# Patient Record
Sex: Female | Born: 1955 | Race: White | Hispanic: No | Marital: Married | Smoking: Never smoker
Health system: Southern US, Community
[De-identification: ages and names within clinical notes are randomized; demographics above are authoritative.]

## PROBLEM LIST (undated history)

## (undated) DIAGNOSIS — N189 Chronic kidney disease, unspecified: Secondary | ICD-10-CM

## (undated) DIAGNOSIS — I10 Essential (primary) hypertension: Secondary | ICD-10-CM

## (undated) DIAGNOSIS — D649 Anemia, unspecified: Secondary | ICD-10-CM

## (undated) DIAGNOSIS — E785 Hyperlipidemia, unspecified: Secondary | ICD-10-CM

## (undated) DIAGNOSIS — E119 Type 2 diabetes mellitus without complications: Secondary | ICD-10-CM

## (undated) DIAGNOSIS — E039 Hypothyroidism, unspecified: Secondary | ICD-10-CM

## (undated) HISTORY — DX: Essential (primary) hypertension: I10

## (undated) HISTORY — PX: CHOLECYSTECTOMY: SHX55

## (undated) HISTORY — DX: Type 2 diabetes mellitus without complications: E11.9

## (undated) HISTORY — DX: Chronic kidney disease, unspecified: N18.9

## (undated) HISTORY — DX: Hyperlipidemia, unspecified: E78.5

## (undated) HISTORY — DX: Hypothyroidism, unspecified: E03.9

## (undated) HISTORY — PX: ABDOMINAL HYSTERECTOMY: SHX81

## (undated) SURGERY — ARTERIOVENOUS (AV) FISTULA CREATION
Anesthesia: Monitor Anesthesia Care | Laterality: Left

---

## 1998-11-21 ENCOUNTER — Other Ambulatory Visit: Admission: RE | Admit: 1998-11-21 | Discharge: 1998-11-21 | Payer: Self-pay | Admitting: Internal Medicine

## 1999-07-13 ENCOUNTER — Encounter: Admission: RE | Admit: 1999-07-13 | Discharge: 1999-07-13 | Payer: Self-pay | Admitting: Internal Medicine

## 1999-07-13 ENCOUNTER — Encounter: Payer: Self-pay | Admitting: Internal Medicine

## 2000-12-23 ENCOUNTER — Encounter: Payer: Self-pay | Admitting: Internal Medicine

## 2000-12-23 ENCOUNTER — Encounter: Admission: RE | Admit: 2000-12-23 | Discharge: 2000-12-23 | Payer: Self-pay | Admitting: Internal Medicine

## 2002-12-22 ENCOUNTER — Encounter: Payer: Self-pay | Admitting: Internal Medicine

## 2002-12-22 ENCOUNTER — Encounter: Admission: RE | Admit: 2002-12-22 | Discharge: 2002-12-22 | Payer: Self-pay | Admitting: Internal Medicine

## 2004-03-02 ENCOUNTER — Other Ambulatory Visit: Admission: RE | Admit: 2004-03-02 | Discharge: 2004-03-02 | Payer: Self-pay | Admitting: Obstetrics and Gynecology

## 2004-11-16 ENCOUNTER — Encounter: Admission: RE | Admit: 2004-11-16 | Discharge: 2004-11-16 | Payer: Self-pay | Admitting: Obstetrics and Gynecology

## 2010-05-13 ENCOUNTER — Encounter: Payer: Self-pay | Admitting: Family Medicine

## 2013-04-03 ENCOUNTER — Other Ambulatory Visit: Payer: Self-pay | Admitting: Interventional Cardiology

## 2013-05-13 ENCOUNTER — Other Ambulatory Visit: Payer: Self-pay | Admitting: Interventional Cardiology

## 2013-06-22 ENCOUNTER — Ambulatory Visit: Payer: Self-pay | Admitting: Endocrinology

## 2013-07-20 ENCOUNTER — Ambulatory Visit: Payer: Self-pay | Admitting: Endocrinology

## 2013-07-20 DIAGNOSIS — Z0289 Encounter for other administrative examinations: Secondary | ICD-10-CM

## 2013-09-16 ENCOUNTER — Ambulatory Visit: Payer: Self-pay | Admitting: Endocrinology

## 2013-09-27 ENCOUNTER — Ambulatory Visit: Payer: Self-pay | Admitting: Endocrinology

## 2013-12-24 ENCOUNTER — Other Ambulatory Visit: Payer: Self-pay | Admitting: Family Medicine

## 2013-12-24 DIAGNOSIS — Z1231 Encounter for screening mammogram for malignant neoplasm of breast: Secondary | ICD-10-CM

## 2014-01-10 ENCOUNTER — Encounter (INDEPENDENT_AMBULATORY_CARE_PROVIDER_SITE_OTHER): Payer: Self-pay

## 2014-01-10 ENCOUNTER — Ambulatory Visit
Admission: RE | Admit: 2014-01-10 | Discharge: 2014-01-10 | Disposition: A | Discharge status: Home / Self Care | Payer: BC Managed Care – PPO | Source: Ambulatory Visit | Attending: Family Medicine | Admitting: Family Medicine

## 2014-01-10 DIAGNOSIS — Z1231 Encounter for screening mammogram for malignant neoplasm of breast: Secondary | ICD-10-CM

## 2014-03-14 ENCOUNTER — Ambulatory Visit: Payer: Self-pay | Admitting: Interventional Cardiology

## 2014-03-14 DIAGNOSIS — M949 Disorder of cartilage, unspecified: Secondary | ICD-10-CM

## 2014-03-14 DIAGNOSIS — I10 Essential (primary) hypertension: Secondary | ICD-10-CM | POA: Insufficient documentation

## 2014-03-14 DIAGNOSIS — M899 Disorder of bone, unspecified: Secondary | ICD-10-CM | POA: Insufficient documentation

## 2014-03-14 DIAGNOSIS — E039 Hypothyroidism, unspecified: Secondary | ICD-10-CM | POA: Insufficient documentation

## 2014-03-14 DIAGNOSIS — E669 Obesity, unspecified: Secondary | ICD-10-CM | POA: Insufficient documentation

## 2014-03-14 DIAGNOSIS — E7800 Pure hypercholesterolemia, unspecified: Secondary | ICD-10-CM | POA: Insufficient documentation

## 2014-03-14 DIAGNOSIS — I421 Obstructive hypertrophic cardiomyopathy: Secondary | ICD-10-CM | POA: Insufficient documentation

## 2014-03-14 DIAGNOSIS — E1165 Type 2 diabetes mellitus with hyperglycemia: Secondary | ICD-10-CM | POA: Insufficient documentation

## 2014-03-14 DIAGNOSIS — IMO0002 Reserved for concepts with insufficient information to code with codable children: Secondary | ICD-10-CM | POA: Insufficient documentation

## 2014-05-16 ENCOUNTER — Ambulatory Visit: Payer: Self-pay | Admitting: Interventional Cardiology

## 2014-06-01 ENCOUNTER — Encounter: Payer: Self-pay | Admitting: Interventional Cardiology

## 2016-07-25 ENCOUNTER — Other Ambulatory Visit: Payer: Self-pay | Admitting: Family Medicine

## 2016-07-25 DIAGNOSIS — Z1231 Encounter for screening mammogram for malignant neoplasm of breast: Secondary | ICD-10-CM

## 2016-08-19 ENCOUNTER — Ambulatory Visit
Admission: RE | Admit: 2016-08-19 | Discharge: 2016-08-19 | Disposition: A | Discharge status: Home / Self Care | Payer: No Typology Code available for payment source | Source: Ambulatory Visit | Attending: Family Medicine | Admitting: Family Medicine

## 2016-08-19 DIAGNOSIS — Z1231 Encounter for screening mammogram for malignant neoplasm of breast: Secondary | ICD-10-CM

## 2017-06-10 ENCOUNTER — Other Ambulatory Visit: Payer: Self-pay

## 2017-06-10 DIAGNOSIS — E7800 Pure hypercholesterolemia, unspecified: Secondary | ICD-10-CM

## 2017-06-10 DIAGNOSIS — E039 Hypothyroidism, unspecified: Secondary | ICD-10-CM

## 2017-06-10 DIAGNOSIS — Z1321 Encounter for screening for nutritional disorder: Secondary | ICD-10-CM

## 2017-06-10 DIAGNOSIS — I10 Essential (primary) hypertension: Secondary | ICD-10-CM

## 2017-06-10 DIAGNOSIS — E119 Type 2 diabetes mellitus without complications: Secondary | ICD-10-CM

## 2017-06-10 DIAGNOSIS — Z0000 Encounter for general adult medical examination without abnormal findings: Secondary | ICD-10-CM

## 2017-06-11 ENCOUNTER — Other Ambulatory Visit: Payer: Self-pay

## 2017-06-11 MED ORDER — CLONIDINE HCL 0.2 MG PO TABS: 0.2000 mg | ORAL_TABLET | Freq: Two times a day (BID) | ORAL | 0 refills | Status: DC

## 2017-06-11 MED ORDER — LISINOPRIL 10 MG PO TABS: 10.0000 mg | ORAL_TABLET | Freq: Every day | ORAL | 0 refills | Status: DC

## 2017-06-11 NOTE — Telephone Encounter (Signed)
Patient called states she's out of her BP meds and her previous pcp won't refill them. She is a new patient her appointment is on 07/03/17.

## 2017-06-11 NOTE — Telephone Encounter (Signed)
Please refill these for 90 days

## 2017-06-24 ENCOUNTER — Other Ambulatory Visit: Payer: Self-pay | Admitting: Family Medicine

## 2017-06-24 DIAGNOSIS — M899 Disorder of bone, unspecified: Secondary | ICD-10-CM

## 2017-06-24 DIAGNOSIS — Z0000 Encounter for general adult medical examination without abnormal findings: Secondary | ICD-10-CM

## 2017-06-24 DIAGNOSIS — I10 Essential (primary) hypertension: Secondary | ICD-10-CM

## 2017-06-24 DIAGNOSIS — M949 Disorder of cartilage, unspecified: Secondary | ICD-10-CM

## 2017-06-24 DIAGNOSIS — E119 Type 2 diabetes mellitus without complications: Secondary | ICD-10-CM

## 2017-06-24 DIAGNOSIS — E039 Hypothyroidism, unspecified: Secondary | ICD-10-CM

## 2017-06-24 DIAGNOSIS — E7800 Pure hypercholesterolemia, unspecified: Secondary | ICD-10-CM

## 2017-06-26 ENCOUNTER — Other Ambulatory Visit: Payer: No Typology Code available for payment source | Admitting: Internal Medicine

## 2017-06-26 DIAGNOSIS — I10 Essential (primary) hypertension: Secondary | ICD-10-CM

## 2017-06-26 DIAGNOSIS — Z0000 Encounter for general adult medical examination without abnormal findings: Secondary | ICD-10-CM

## 2017-06-26 DIAGNOSIS — Z1321 Encounter for screening for nutritional disorder: Secondary | ICD-10-CM

## 2017-06-26 DIAGNOSIS — E039 Hypothyroidism, unspecified: Secondary | ICD-10-CM

## 2017-06-26 DIAGNOSIS — E119 Type 2 diabetes mellitus without complications: Secondary | ICD-10-CM

## 2017-06-26 DIAGNOSIS — E7800 Pure hypercholesterolemia, unspecified: Secondary | ICD-10-CM

## 2017-06-27 LAB — MICROALBUMIN / CREATININE URINE RATIO
Creatinine, Urine: 40 mg/dL (ref 20–275)
Microalb Creat Ratio: 95 mcg/mg creat — ABNORMAL HIGH (ref <30)
Microalb, Ur: 3.8 mg/dL

## 2017-06-27 LAB — CBC WITH DIFFERENTIAL/PLATELET
BASOS PCT: 1.2 %
Basophils Absolute: 61 cells/uL (ref 0–200)
Eosinophils Absolute: 71 cells/uL (ref 15–500)
Eosinophils Relative: 1.4 %
HCT: 39.5 % (ref 35.0–45.0)
Hemoglobin: 13.2 g/dL (ref 11.7–15.5)
Lymphs Abs: 1239 cells/uL (ref 850–3900)
MCH: 28.5 pg (ref 27.0–33.0)
MCHC: 33.4 g/dL (ref 32.0–36.0)
MCV: 85.3 fL (ref 80.0–100.0)
MPV: 12.2 fL (ref 7.5–12.5)
Monocytes Relative: 7.8 %
Neutro Abs: 3330 cells/uL (ref 1500–7800)
Neutrophils Relative %: 65.3 %
Platelets: 241 10*3/uL (ref 140–400)
RBC: 4.63 10*6/uL (ref 3.80–5.10)
RDW: 12.8 % (ref 11.0–15.0)
Total Lymphocyte: 24.3 %
WBC mixed population: 398 cells/uL (ref 200–950)
WBC: 5.1 10*3/uL (ref 3.8–10.8)

## 2017-06-27 LAB — COMPLETE METABOLIC PANEL WITH GFR
AG Ratio: 1.5 (calc) (ref 1.0–2.5)
ALT: 13 U/L (ref 6–29)
AST: 11 U/L (ref 10–35)
Albumin: 4 g/dL (ref 3.6–5.1)
Alkaline phosphatase (APISO): 94 U/L (ref 33–130)
BILIRUBIN TOTAL: 0.7 mg/dL (ref 0.2–1.2)
BUN: 14 mg/dL (ref 7–25)
CO2: 26 mmol/L (ref 20–32)
Calcium: 9.2 mg/dL (ref 8.6–10.4)
Chloride: 101 mmol/L (ref 98–110)
Creat: 0.84 mg/dL (ref 0.50–0.99)
GFR, Est African American: 87 mL/min/{1.73_m2} (ref >=60)
GFR, Est Non African American: 75 mL/min/{1.73_m2} (ref >=60)
GLUCOSE: 284 mg/dL — AB (ref 65–99)
Globulin: 2.7 g/dL (calc) (ref 1.9–3.7)
Potassium: 4.4 mmol/L (ref 3.5–5.3)
Sodium: 137 mmol/L (ref 135–146)
Total Protein: 6.7 g/dL (ref 6.1–8.1)

## 2017-06-27 LAB — LIPID PANEL
CHOL/HDL RATIO: 4.4 (calc) (ref <5.0)
Cholesterol: 218 mg/dL — ABNORMAL HIGH (ref <200)
HDL: 50 mg/dL — ABNORMAL LOW (ref >50)
LDL CHOLESTEROL (CALC): 141 mg/dL — AB
NON-HDL CHOLESTEROL (CALC): 168 mg/dL — AB (ref <130)
Triglycerides: 147 mg/dL (ref <150)

## 2017-06-27 LAB — HEMOGLOBIN A1C: Hgb A1c MFr Bld: >14 % of total Hgb — ABNORMAL HIGH (ref <5.7)

## 2017-06-27 LAB — TSH: TSH: 2.38 m[IU]/L (ref 0.40–4.50)

## 2017-06-27 LAB — VITAMIN D 25 HYDROXY (VIT D DEFICIENCY, FRACTURES): Vit D, 25-Hydroxy: 9 ng/mL — ABNORMAL LOW (ref 30–100)

## 2017-06-27 LAB — VITAMIN B12: Vitamin B-12: 263 pg/mL (ref 200–1100)

## 2017-06-30 ENCOUNTER — Other Ambulatory Visit: Payer: Self-pay | Admitting: Internal Medicine

## 2017-07-03 ENCOUNTER — Encounter: Payer: Self-pay | Admitting: Internal Medicine

## 2017-07-03 ENCOUNTER — Other Ambulatory Visit (HOSPITAL_COMMUNITY)
Admission: RE | Admit: 2017-07-03 | Discharge: 2017-07-03 | Disposition: A | Discharge status: Home / Self Care | Payer: PRIVATE HEALTH INSURANCE | Source: Ambulatory Visit | Attending: Internal Medicine | Admitting: Internal Medicine

## 2017-07-03 ENCOUNTER — Emergency Department (HOSPITAL_COMMUNITY): Payer: No Typology Code available for payment source

## 2017-07-03 ENCOUNTER — Inpatient Hospital Stay (HOSPITAL_COMMUNITY)
Admission: EM | Admit: 2017-07-03 | Discharge: 2017-07-07 | DRG: 872 | Disposition: A | Discharge status: Home / Self Care | Payer: No Typology Code available for payment source | Attending: Internal Medicine | Admitting: Internal Medicine

## 2017-07-03 ENCOUNTER — Ambulatory Visit (INDEPENDENT_AMBULATORY_CARE_PROVIDER_SITE_OTHER): Payer: No Typology Code available for payment source | Admitting: Internal Medicine

## 2017-07-03 ENCOUNTER — Other Ambulatory Visit: Payer: Self-pay

## 2017-07-03 ENCOUNTER — Encounter (HOSPITAL_COMMUNITY): Payer: Self-pay

## 2017-07-03 VITALS — BP 160/90 | HR 118 | Temp 101.5°F | Ht 59.5 in | Wt 154.0 lb

## 2017-07-03 DIAGNOSIS — I10 Essential (primary) hypertension: Secondary | ICD-10-CM | POA: Diagnosis present

## 2017-07-03 DIAGNOSIS — L0231 Cutaneous abscess of buttock: Secondary | ICD-10-CM | POA: Diagnosis present

## 2017-07-03 DIAGNOSIS — E1165 Type 2 diabetes mellitus with hyperglycemia: Secondary | ICD-10-CM | POA: Diagnosis present

## 2017-07-03 DIAGNOSIS — L0291 Cutaneous abscess, unspecified: Secondary | ICD-10-CM

## 2017-07-03 DIAGNOSIS — E861 Hypovolemia: Secondary | ICD-10-CM | POA: Diagnosis present

## 2017-07-03 DIAGNOSIS — R81 Glycosuria: Secondary | ICD-10-CM | POA: Diagnosis present

## 2017-07-03 DIAGNOSIS — R8290 Unspecified abnormal findings in urine: Secondary | ICD-10-CM

## 2017-07-03 DIAGNOSIS — I421 Obstructive hypertrophic cardiomyopathy: Secondary | ICD-10-CM | POA: Diagnosis present

## 2017-07-03 DIAGNOSIS — A419 Sepsis, unspecified organism: Principal | ICD-10-CM | POA: Diagnosis present

## 2017-07-03 DIAGNOSIS — E785 Hyperlipidemia, unspecified: Secondary | ICD-10-CM

## 2017-07-03 DIAGNOSIS — E039 Hypothyroidism, unspecified: Secondary | ICD-10-CM | POA: Diagnosis present

## 2017-07-03 DIAGNOSIS — E871 Hypo-osmolality and hyponatremia: Secondary | ICD-10-CM | POA: Diagnosis present

## 2017-07-03 DIAGNOSIS — L02215 Cutaneous abscess of perineum: Secondary | ICD-10-CM

## 2017-07-03 DIAGNOSIS — IMO0002 Reserved for concepts with insufficient information to code with codable children: Secondary | ICD-10-CM | POA: Diagnosis present

## 2017-07-03 DIAGNOSIS — Z0000 Encounter for general adult medical examination without abnormal findings: Secondary | ICD-10-CM | POA: Diagnosis present

## 2017-07-03 DIAGNOSIS — Z79899 Other long term (current) drug therapy: Secondary | ICD-10-CM

## 2017-07-03 DIAGNOSIS — Z7984 Long term (current) use of oral hypoglycemic drugs: Secondary | ICD-10-CM

## 2017-07-03 DIAGNOSIS — E1169 Type 2 diabetes mellitus with other specified complication: Secondary | ICD-10-CM | POA: Diagnosis not present

## 2017-07-03 LAB — URINALYSIS, ROUTINE W REFLEX MICROSCOPIC
Bacteria, UA: NONE SEEN
Bilirubin Urine: NEGATIVE
Ketones, ur: 20 mg/dL — AB
NITRITE: NEGATIVE
Protein, ur: NEGATIVE mg/dL
Specific Gravity, Urine: 1.03 (ref 1.005–1.030)
pH: 5 (ref 5.0–8.0)

## 2017-07-03 LAB — POCT URINALYSIS DIPSTICK
Appearance: ABNORMAL
BILIRUBIN UA: NEGATIVE
Glucose, UA: 1000
Ketones, UA: NEGATIVE
NITRITE UA: NEGATIVE
Odor: ABNORMAL
PH UA: 6 (ref 5.0–8.0)
Protein, UA: NEGATIVE
SPEC GRAV UA: 1.01 (ref 1.010–1.025)
UROBILINOGEN UA: 0.2 U/dL

## 2017-07-03 LAB — CBC WITH DIFFERENTIAL/PLATELET
BASOS PCT: 0 %
Basophils Absolute: 0 10*3/uL (ref 0.0–0.1)
EOS ABS: 0 10*3/uL (ref 0.0–0.7)
EOS PCT: 0 %
HCT: 38.7 % (ref 36.0–46.0)
Hemoglobin: 13.5 g/dL (ref 12.0–15.0)
LYMPHS ABS: 1.4 10*3/uL (ref 0.7–4.0)
Lymphocytes Relative: 9 %
MCH: 29.7 pg (ref 26.0–34.0)
MCHC: 34.9 g/dL (ref 30.0–36.0)
MCV: 85.2 fL (ref 78.0–100.0)
Monocytes Absolute: 1.7 10*3/uL — ABNORMAL HIGH (ref 0.1–1.0)
Monocytes Relative: 11 %
NEUTROS PCT: 80 %
Neutro Abs: 12.7 10*3/uL — ABNORMAL HIGH (ref 1.7–7.7)
PLATELETS: 255 10*3/uL (ref 150–400)
RBC: 4.54 MIL/uL (ref 3.87–5.11)
RDW: 12.7 % (ref 11.5–15.5)
WBC: 15.9 10*3/uL — ABNORMAL HIGH (ref 4.0–10.5)

## 2017-07-03 LAB — COMPREHENSIVE METABOLIC PANEL
ALK PHOS: 101 U/L (ref 38–126)
ALT: 14 U/L (ref 14–54)
AST: 17 U/L (ref 15–41)
Albumin: 3.8 g/dL (ref 3.5–5.0)
Anion gap: 14 (ref 5–15)
BUN: 18 mg/dL (ref 6–20)
CALCIUM: 9.1 mg/dL (ref 8.9–10.3)
CO2: 22 mmol/L (ref 22–32)
CREATININE: 0.81 mg/dL (ref 0.44–1.00)
Chloride: 97 mmol/L — ABNORMAL LOW (ref 101–111)
GFR calc non Af Amer: >60 mL/min (ref >60)
Glucose, Bld: 321 mg/dL — ABNORMAL HIGH (ref 65–99)
Potassium: 4.2 mmol/L (ref 3.5–5.1)
Sodium: 133 mmol/L — ABNORMAL LOW (ref 135–145)
Total Bilirubin: 1.6 mg/dL — ABNORMAL HIGH (ref 0.3–1.2)
Total Protein: 8 g/dL (ref 6.5–8.1)

## 2017-07-03 LAB — I-STAT CG4 LACTIC ACID, ED: Lactic Acid, Venous: 1.24 mmol/L (ref 0.5–1.9)

## 2017-07-03 LAB — POCT GLUCOSE (DEVICE FOR HOME USE): POC Glucose: 441 mg/dl — AB (ref 70–99)

## 2017-07-03 MED ORDER — CANAGLIFLOZIN 100 MG PO TABS
300.0000 mg | ORAL_TABLET | Freq: Every morning | ORAL | Status: DC
  Filled 2017-07-03: qty 3

## 2017-07-03 MED ORDER — ONDANSETRON HCL 4 MG PO TABS: 4.0000 mg | ORAL_TABLET | Freq: Four times a day (QID) | ORAL | Status: DC | PRN

## 2017-07-03 MED ORDER — VANCOMYCIN HCL IN DEXTROSE 1-5 GM/200ML-% IV SOLN
1000.0000 mg | Freq: Once | INTRAVENOUS | Status: AC
  Administered 2017-07-03: 1000 mg via INTRAVENOUS
  Filled 2017-07-03: qty 200

## 2017-07-03 MED ORDER — SODIUM CHLORIDE 0.9 % IV BOLUS (SEPSIS)
1000.0000 mL | Freq: Once | INTRAVENOUS | Status: AC
  Administered 2017-07-03: 1000 mL via INTRAVENOUS

## 2017-07-03 MED ORDER — LISINOPRIL 10 MG PO TABS
10.0000 mg | ORAL_TABLET | Freq: Every day | ORAL | Status: DC
  Administered 2017-07-04 – 2017-07-07 (×4): 10 mg via ORAL
  Filled 2017-07-03 (×4): qty 1

## 2017-07-03 MED ORDER — ROSUVASTATIN CALCIUM 20 MG PO TABS
40.0000 mg | ORAL_TABLET | Freq: Every day | ORAL | Status: DC
  Administered 2017-07-04 – 2017-07-07 (×4): 40 mg via ORAL
  Filled 2017-07-03 (×4): qty 2

## 2017-07-03 MED ORDER — ACETAMINOPHEN 325 MG PO TABS: 650.0000 mg | ORAL_TABLET | Freq: Four times a day (QID) | ORAL | Status: DC | PRN

## 2017-07-03 MED ORDER — SODIUM CHLORIDE 0.9 % IV SOLN
INTRAVENOUS | Status: AC
  Administered 2017-07-04: 01:00:00 via INTRAVENOUS

## 2017-07-03 MED ORDER — ONDANSETRON HCL 4 MG/2ML IJ SOLN: 4.0000 mg | Freq: Four times a day (QID) | INTRAMUSCULAR | Status: DC | PRN

## 2017-07-03 MED ORDER — LIDOCAINE-EPINEPHRINE (PF) 2 %-1:200000 IJ SOLN
10.0000 mL | Freq: Once | INTRAMUSCULAR | Status: AC
  Administered 2017-07-03: 10 mL
  Filled 2017-07-03: qty 20

## 2017-07-03 MED ORDER — PIPERACILLIN-TAZOBACTAM 3.375 G IVPB 30 MIN: 3.3750 g | Freq: Once | INTRAVENOUS | Status: DC

## 2017-07-03 MED ORDER — CLONIDINE HCL 0.2 MG PO TABS
0.2000 mg | ORAL_TABLET | Freq: Two times a day (BID) | ORAL | Status: DC
  Administered 2017-07-04 – 2017-07-07 (×8): 0.2 mg via ORAL
  Filled 2017-07-03 (×8): qty 1

## 2017-07-03 MED ORDER — PIPERACILLIN-TAZOBACTAM 3.375 G IVPB 30 MIN
3.3750 g | Freq: Once | INTRAVENOUS | Status: AC
  Administered 2017-07-03: 3.375 g via INTRAVENOUS
  Filled 2017-07-03: qty 50

## 2017-07-03 MED ORDER — INSULIN GLARGINE 100 UNIT/ML ~~LOC~~ SOLN
10.0000 [IU] | Freq: Every day | SUBCUTANEOUS | Status: DC
  Administered 2017-07-04: 10 [IU] via SUBCUTANEOUS
  Filled 2017-07-03: qty 0.1

## 2017-07-03 MED ORDER — INSULIN ASPART 100 UNIT/ML ~~LOC~~ SOLN
0.0000 [IU] | Freq: Three times a day (TID) | SUBCUTANEOUS | Status: DC
  Administered 2017-07-04: 2 [IU] via SUBCUTANEOUS

## 2017-07-03 MED ORDER — TETANUS-DIPHTH-ACELL PERTUSSIS 5-2.5-18.5 LF-MCG/0.5 IM SUSP
0.5000 mL | Freq: Once | INTRAMUSCULAR | Status: AC
  Administered 2017-07-03: 0.5 mL via INTRAMUSCULAR
  Filled 2017-07-03: qty 0.5

## 2017-07-03 MED ORDER — ENOXAPARIN SODIUM 40 MG/0.4ML ~~LOC~~ SOLN
40.0000 mg | Freq: Every day | SUBCUTANEOUS | Status: DC
  Administered 2017-07-04 – 2017-07-06 (×4): 40 mg via SUBCUTANEOUS
  Filled 2017-07-03 (×4): qty 0.4

## 2017-07-03 MED ORDER — ACETAMINOPHEN 650 MG RE SUPP: 650.0000 mg | Freq: Four times a day (QID) | RECTAL | Status: DC | PRN

## 2017-07-03 NOTE — Patient Instructions (Addendum)
Patient advised to go to [facility] long emergency department for evaluation and treatment of perineal abscess and poorly controlled diabetes mellitus.  She has no ketones in her urine but Accu-Chek here was 441.

## 2017-07-03 NOTE — H&P (Signed)
History and Physical    AYNSLEE MULHALL YQI:[id number] DOB: [birth year] DOA: 07/03/2017  PCP: Elby Showers, MD  Patient coming from: Home.  Chief Complaint: Fever and chills.  HPI: Allison Patel is a 62 y.o. female with history of diabetes mellitus type 2, hypertension had gone for routine primary care visit to Dr. Renold Genta when patient was found to be febrile.  Patient states she has been having fever and chills last 3 days with some left buttock abscess which is draining.  Patient blood sugar was also found to be elevated with urine showing ketones.  Patient has not been able to take her insulin for last 3 weeks since patient's insulin became very expensive.  Patient however was taking oral hypoglycemics.  Patient was referred to the ER.  Patient otherwise denies any chest pain shortness of breath nausea vomiting abdominal pain or diarrhea.  ED Course: In the ER on exam patient had a left buttock abscess which is mildly draining and further incision and drainage was done by the ER physician.  Patient was tachycardic with leukocytosis and febrile and met with sepsis protocol.  Patient was given fluid bolus as per the sepsis protocol and started on antibiotics.  Cultures were obtained.  Review of Systems: As per HPI, rest all negative.   Past Medical History:  Diagnosis Date  . Diabetes mellitus without complication ([place])   . Hyperlipidemia   . Hypertension   . Hypothyroidism     Past Surgical History:  Procedure Laterality Date  . ABDOMINAL HYSTERECTOMY    . CESAREAN SECTION     2  . CHOLECYSTECTOMY       reports that  has never smoked. she has never used smokeless tobacco. She reports that she does not drink alcohol or use drugs.  Allergies  Allergen Reactions  . Macrobid [Nitrofurantoin Macrocrystal] Rash    Family History  Problem Relation Age of Onset  . Diabetes Mother   . Hypertension Mother   . Congestive Heart Failure Mother   . Hypertension Father   .  Pneumonia Father   . Pancreatic cancer Brother     Prior to Admission medications   Medication Sig Start Date End Date Taking? Authorizing Provider  cloNIDine (CATAPRES) 0.2 MG tablet Take 1 tablet (0.2 mg total) by mouth 2 (two) times daily. 06/11/17  Yes [name], Cresenciano Lick, MD  INVOKANA 300 MG TABS tablet Take 300 mg by mouth every morning. 06/23/17  Yes [provider]  lisinopril (PRINIVIL,ZESTRIL) 10 MG tablet Take 1 tablet (10 mg total) by mouth daily. 06/11/17  Yes [name], Cresenciano Lick, MD  rosuvastatin (CRESTOR) 40 MG tablet Take 40 mg by mouth daily.   Yes [provider]  SitaGLIPtin-MetFORMIN HCl 50-1000 MG TB24 Take 1 tablet by mouth daily.    Yes [provider]    Physical Exam: Vitals:   07/03/17 2215 07/03/17 2230 07/03/17 2245 07/03/17 2303  BP:    (!) 173/74  Pulse: (!) 108 (!) 109 (!) 107 (!) 115  Resp: (!) 30 (!) 22 (!) 25 18  Temp:    99.2 F (37.3 C)  TempSrc:    Oral  SpO2: 96% 95% 95% 98%  Weight:    69.9 kg (154 lb 1.6 oz)  Height:    4' 11.5" (1.511 m)      Constitutional: Moderately built and nourished. Vitals:   07/03/17 2215 07/03/17 2230 07/03/17 2245 07/03/17 2303  BP:    (!) 173/74  Pulse: (!) 108 Marland Kitchen)  109 (!) 107 (!) 115  Resp: (!) 30 (!) 22 (!) 25 18  Temp:    99.2 F (37.3 C)  TempSrc:    Oral  SpO2: 96% 95% 95% 98%  Weight:    69.9 kg (154 lb 1.6 oz)  Height:    4' 11.5" (1.511 m)   Eyes: Anicteric no pallor. ENMT: No discharge from the ears eyes nose or mouth. Neck: No mass felt.  No neck rigidity. Respiratory: No rhonchi or crepitations. Cardiovascular: S1-S2 heard no murmurs appreciated. Abdomen: Soft nontender bowel sounds present. Musculoskeletal: No edema.  No joint effusion. Skin: Patient has a left buttock abscess which was drained and has been dressed at this time.  I was unable to examine the exact area but as per the nurse it was still draining mildly.  With dressing on. Neurologic: Alert awake oriented to  time place and person.  Moves all extremities 5 x 5. Psychiatric: Appears normal.  Normal affect.   Labs on Admission: I have personally reviewed following labs and imaging studies  CBC: Recent Labs  Lab 07/03/17 2013  WBC 15.9*  NEUTROABS 12.7*  HGB 13.5  HCT 38.7  MCV 85.2  PLT 562   Basic Metabolic Panel: Recent Labs  Lab 07/03/17 2013  NA 133*  K 4.2  CL 97*  CO2 22  GLUCOSE 321*  BUN 18  CREATININE 0.81  CALCIUM 9.1   GFR: Estimated Creatinine Clearance: 62.9 mL/min (by C-G formula based on SCr of 0.81 mg/dL). Liver Function Tests: Recent Labs  Lab 07/03/17 2013  AST 17  ALT 14  ALKPHOS 101  BILITOT 1.6*  PROT 8.0  ALBUMIN 3.8   No results for input(s): LIPASE, AMYLASE in the last 168 hours. No results for input(s): AMMONIA in the last 168 hours. Coagulation Profile: No results for input(s): INR, PROTIME in the last 168 hours. Cardiac Enzymes: No results for input(s): CKTOTAL, CKMB, CKMBINDEX, TROPONINI in the last 168 hours. BNP (last 3 results) No results for input(s): PROBNP in the last 8760 hours. HbA1C: No results for input(s): HGBA1C in the last 72 hours. CBG: No results for input(s): GLUCAP in the last 168 hours. Lipid Profile: No results for input(s): CHOL, HDL, LDLCALC, TRIG, CHOLHDL, LDLDIRECT in the last 72 hours. Thyroid Function Tests: No results for input(s): TSH, T4TOTAL, FREET4, T3FREE, THYROIDAB in the last 72 hours. Anemia Panel: No results for input(s): VITAMINB12, FOLATE, FERRITIN, TIBC, IRON, RETICCTPCT in the last 72 hours. Urine analysis:    Component Value Date/Time   COLORURINE YELLOW 07/03/2017 2058   APPEARANCEUR HAZY (A) 07/03/2017 2058   LABSPEC 1.030 07/03/2017 2058   PHURINE 5.0 07/03/2017 2058   GLUCOSEU >=500 (A) 07/03/2017 2058   HGBUR MODERATE (A) 07/03/2017 2058   BILIRUBINUR NEGATIVE 07/03/2017 2058   BILIRUBINUR NEG 07/03/2017 1527   KETONESUR 20 (A) 07/03/2017 2058   PROTEINUR NEGATIVE 07/03/2017 2058    UROBILINOGEN 0.2 07/03/2017 1527   NITRITE NEGATIVE 07/03/2017 2058   LEUKOCYTESUR LARGE (A) 07/03/2017 2058   Sepsis Labs: _0 (procalcitonin:4,lacticidven:4) )No results found for this or any previous visit (from the past 240 hour(s)).   Radiological Exams on Admission: Dg Chest Port 1 View  Result Date: 07/03/2017 CLINICAL DATA:  62 year old female with sepsis. EXAM: PORTABLE CHEST 1 VIEW COMPARISON:  None. FINDINGS: The lungs are clear. There is no pleural effusion or pneumothorax. Mild cardiomegaly. No acute osseous pathology. IMPRESSION: No active disease. Electronically Signed   By: Anner Crete M.D.   On: 07/03/2017 21:09  Assessment/Plan Principal Problem:   Sepsis ([place]) Active Problems:   Benign essential HTN   Hypertrophic obstructive cardiomyopathy (HCC)   Diabetes mellitus type 2, uncontrolled (HCC)   Abscess    1. Sepsis secondary to left buttock abscess -has had incision and drainage done and also has mild losing ongoing.  Will get a CT pelvis to make sure there is no perirectal abscess collection or deeper collection.  I have placed patient on empiric antibiotics for sepsis now follow cultures.  Follow lactic acid and pro calcitonin levels. 2. Uncontrolled diabetes mellitus type 2 -patient has not been able to take her insulin to cost issues last few weeks.  I have placed patient on Lantus 10 units subcu at bedtime 1 dose now and will continue with hydration and closely follow metabolic panel to make sure patient is not developing any DKA.  Continue with Januvia but will hold Invokana and metformin. 3. Hypertension uncontrolled on lisinopril and clonidine which will be continued.  I will also place patient on PRN IV hydralazine since patient blood pressure is elevated. 4. Hyperlipidemia on statins. 5. History of HOCM per the chart.  Patient states she has had murmur since birth but does not recall being told HOCM.  On exam patient does have a systolic  murmur.  Closely observe since patient is receiving fluids for any respiratory distress.   DVT prophylaxis: Lovenox. Code Status: Full code. Family Communication: Patient's husband. Disposition Plan: Home. Consults called: Wound team. Admission status: Inpatient.   Rise Patience MD [facility] Pager [phone].  If 7PM-7AM, please contact night-coverage [email or web address] Password [facility]  07/03/2017, 11:56 PM

## 2017-07-03 NOTE — ED Provider Notes (Signed)
[facility] DEPT Provider Note   CSN: [id number] Arrival date & time: 07/03/17  1618     History   Chief Complaint Chief Complaint  Patient presents with  . Rectal Pain    HPI Allison Patel is a 62 y.o. female with a history of diabetes, hypertension, hyperlipidemia, presents today for evaluation of a abscess.  She reportedly went to her primary care provider today for her regular physical, was found to be hyperglycemic.  She reports that when her PCP went to perform her Pap she noticed this large area of redness and swelling on her bottom.  Patient was febrile at the PCPs office at 101.5, she reports chills starting last night.  She reports that she first noticed the bump about 3 days ago.  She reports that her diabetes has not been well controlled.  She reports that it has been draining.  She denies cough, sore throat, no urinary symptoms, no pain nausea vomiting or diarrhea.    HPI  Past Medical History:  Diagnosis Date  . Diabetes mellitus without complication ([place])   . Hyperlipidemia   . Hypertension   . Hypothyroidism     Patient Active Problem List   Diagnosis Date Noted  . Sepsis ([place]) 07/03/2017  . Benign essential HTN 03/14/2014  . Hypertrophic obstructive cardiomyopathy ([place]) 03/14/2014  . Adiposity 03/14/2014  . Bone/cartilage disorder 03/14/2014  . Hypercholesterolemia without hypertriglyceridemia 03/14/2014  . Adult hypothyroidism 03/14/2014  . Hyperglyceridemia, pure 03/14/2014  . Diabetes mellitus type 2, uncontrolled ([place]) 03/14/2014    Past Surgical History:  Procedure Laterality Date  . ABDOMINAL HYSTERECTOMY    . CESAREAN SECTION     2  . CHOLECYSTECTOMY      OB History    No data available       Home Medications    Prior to Admission medications   Medication Sig Start Date End Date Taking? Authorizing Provider  cloNIDine (CATAPRES) 0.2 MG tablet Take 1 tablet (0.2 mg total) by mouth 2 (two) times daily.  06/11/17  Yes [name], Cresenciano Lick, MD  INVOKANA 300 MG TABS tablet Take 300 mg by mouth every morning. 06/23/17  Yes [provider]  lisinopril (PRINIVIL,ZESTRIL) 10 MG tablet Take 1 tablet (10 mg total) by mouth daily. 06/11/17  Yes [name], Cresenciano Lick, MD  rosuvastatin (CRESTOR) 40 MG tablet Take 40 mg by mouth daily.   Yes [provider]  SitaGLIPtin-MetFORMIN HCl 50-1000 MG TB24 Take 1 tablet by mouth daily.    Yes [provider]    Family History Family History  Problem Relation Age of Onset  . Diabetes Mother   . Hypertension Mother   . Congestive Heart Failure Mother   . Hypertension Father   . Pneumonia Father   . Pancreatic cancer Brother     Social History Social History   Tobacco Use  . Smoking status: Never Smoker  . Smokeless tobacco: Never Used  Substance Use Topics  . Alcohol use: No    Frequency: Never  . Drug use: No     Allergies   Macrobid [nitrofurantoin macrocrystal]   Review of Systems Review of Systems  Constitutional: Positive for chills and fever. Negative for activity change, appetite change, diaphoresis, fatigue and unexpected weight change.  HENT: Negative for congestion.   Eyes: Negative for visual disturbance.  Respiratory: Negative for cough, chest tightness and shortness of breath.   Gastrointestinal: Positive for rectal pain. Negative for abdominal pain, nausea and vomiting.  Genitourinary: Negative  for dysuria, frequency and urgency.  Skin: Positive for color change and wound.  Neurological: Negative for headaches.  Psychiatric/Behavioral: Negative for confusion.  All other systems reviewed and are negative.    Physical Exam Updated Vital Signs BP (!) 168/78   Pulse (!) 111   Temp (!) 102.2 F (39 C) (Rectal)   Resp (!) 25   Ht 4' 11.5" (1.511 m)   Wt 69.9 kg (154 lb)   SpO2 95%   BMI 30.58 kg/m   Physical Exam  Constitutional: She appears well-developed and well-nourished. No distress.  HENT:  Head:  Normocephalic and atraumatic.  Eyes: Conjunctivae are normal. Right eye exhibits no discharge. Left eye exhibits no discharge. No scleral icterus.  Neck: Normal range of motion.  Cardiovascular: Normal rate, regular rhythm and intact distal pulses.  Murmur heard. Pulmonary/Chest: Effort normal and breath sounds normal. No stridor. No respiratory distress.  Abdominal: Soft. Bowel sounds are normal. She exhibits no distension. There is no tenderness.  Genitourinary:  Genitourinary Comments: Full GU exam not performed.  Rectal/potserior exam showed diffuse redness with multiple satellite lesions and scaling consistent with yeast infection.  Musculoskeletal: She exhibits no edema or deformity.  Neurological: She is alert. She exhibits normal muscle tone.  Skin: Skin is warm and dry. She is not diaphoretic.  There is a large area of erythema in the gluteal cleft.  There is a swollen, red, tender to palpation and indurated area with pus draining from the middle.  There is erythema over the sacrum with a small skin break that is separate.  Psychiatric: She has a normal mood and affect. Her behavior is normal.  Nursing note and vitals reviewed.    ED Treatments / Results  Labs (all labs ordered are listed, but only abnormal results are displayed) Labs Reviewed  COMPREHENSIVE METABOLIC PANEL - Abnormal; Notable for the following components:      Result Value   Sodium 133 (*)    Chloride 97 (*)    Glucose, Bld 321 (*)    Total Bilirubin 1.6 (*)    All other components within normal limits  CBC WITH DIFFERENTIAL/PLATELET - Abnormal; Notable for the following components:   WBC 15.9 (*)    Neutro Abs 12.7 (*)    Monocytes Absolute 1.7 (*)    All other components within normal limits  URINALYSIS, ROUTINE W REFLEX MICROSCOPIC - Abnormal; Notable for the following components:   APPearance HAZY (*)    Glucose, UA >=500 (*)    Hgb urine dipstick MODERATE (*)    Ketones, ur 20 (*)    Leukocytes,  UA LARGE (*)    Squamous Epithelial / LPF 0-5 (*)    All other components within normal limits  CULTURE, BLOOD (ROUTINE X 2)  CULTURE, BLOOD (ROUTINE X 2)  AEROBIC CULTURE (SUPERFICIAL SPECIMEN)  I-STAT CG4 LACTIC ACID, ED    EKG  EKG Interpretation  Date/Time:  Thursday July 03 2017 20:35:30 EDT Ventricular Rate:  121 PR Interval:    QRS Duration: 156 QT Interval:  353 QTC Calculation: 501 R Axis:   178 Text Interpretation:  Sinus tachycardia Probable left atrial enlargement RBBB and LPFB No old tracing to compare Confirmed by Daleen Bo [phone]) on 07/03/2017 8:42:33 PM       Radiology Dg Chest Port 1 View  Result Date: 07/03/2017 CLINICAL DATA:  62 year old female with sepsis. EXAM: PORTABLE CHEST 1 VIEW COMPARISON:  None. FINDINGS: The lungs are clear. There is no pleural effusion or pneumothorax. Mild  cardiomegaly. No acute osseous pathology. IMPRESSION: No active disease. Electronically Signed   By: Anner Crete M.D.   On: 07/03/2017 21:09    Procedures .Marland KitchenIncision and Drainage Date/Time: 07/03/2017 10:10 PM Performed by: Lorin Glass, PA-C Authorized by: Lorin Glass, PA-C   Consent:    Consent obtained:  Verbal   Consent given by:  Patient   Risks discussed:  Bleeding, incomplete drainage, pain, infection and damage to other organs (Damage to other structures, need for additional procedures)   Alternatives discussed:  No treatment, alternative treatment and referral Location:    Type:  Abscess   Size:  5x6cm   Location:  Anogenital   Anogenital location: left buttock. Pre-procedure details:    Skin preparation:  Chloraprep Anesthesia (see MAR for exact dosages):    Anesthesia method:  Local infiltration   Local anesthetic:  Lidocaine 2% WITH epi Procedure type:    Complexity:  Complex Procedure details:    Incision types:  Stab incision   Incision depth:  Subcutaneous   Scalpel blade:  11   Wound management:  Probed and  deloculated and irrigated with saline   Drainage:  Bloody and purulent   Drainage amount:  Moderate   Packing materials:  1/4 in gauze   Amount 1/4":  One piece Post-procedure details:    Patient tolerance of procedure:  Tolerated well, no immediate complications   (including critical care time) CRITICAL CARE Performed by: Wyn Quaker Total critical care time: 30 minutes Critical care time was exclusive of separately billable procedures and treating other patients. Critical care was necessary to treat or prevent imminent or life-threatening deterioration. Critical care was time spent personally by me on the following activities: development of treatment plan with patient and/or surrogate as well as nursing, discussions with consultants, evaluation of patient's response to treatment, examination of patient, obtaining history from patient or surrogate, ordering and performing treatments and interventions, ordering and review of laboratory studies, ordering and review of radiographic studies, pulse oximetry and re-evaluation of patient's condition.  Sepsis requiring 2 or more antibiotics.    Medications Ordered in ED Medications  vancomycin (VANCOCIN) IVPB 1000 mg/200 mL premix (1,000 mg Intravenous New Bag/Given 07/03/17 2158)  Tdap (BOOSTRIX) injection 0.5 mL (not administered)  piperacillin-tazobactam (ZOSYN) IVPB 3.375 g (0 g Intravenous Stopped 07/03/17 2145)  sodium chloride 0.9 % bolus 1,000 mL (0 mLs Intravenous Stopped 07/03/17 2207)  lidocaine-EPINEPHrine (XYLOCAINE W/EPI) 2 %-1:200000 (PF) injection 10 mL (10 mLs Infiltration Given by Other 07/03/17 2100)     Initial Impression / Assessment and Plan / ED Course  I have reviewed the triage vital signs and the nursing notes.  Pertinent labs & imaging results that were available during my care of the patient were reviewed by me and considered in my medical decision making (see chart for details).  Clinical Course as of Jul 03 2249  Thu Jul 03, 2017  2020 Patient temp checked while in room, 72 F  [EH]  2203 Spoke with hospitalist who will admit patient.   [EH]    Clinical Course User Index [EH] Lorin Glass, PA-C   Patient presents today for evaluation of fevers and an abscess.  She was found to be septic on my evaluation, tachycardic and febrile.  Code sepsis was called and emperic antibiotics were started.  Suspected source is left buttock abscess.  Labs were obtained, blood cultures were obtained.  She was given 1 L fluid bolus.  She was not given full 30/kg  which she is not hypotensive and her lactic is normal.  She was offered pain and nausea medicine, however declined.  Incision and drainage was performed, and packing was placed.  She appears to have a superficial yeast infection and I suspect that this caused a skin break to allow for abscess formation.  She is also diabetic, sugar 321, however A1c obtained today PCP was over 14%.  Hospitalist was consulted for admission and agreed to admit patient.    Final Clinical Impressions(s) / ED Diagnoses   Final diagnoses:  Sepsis, due to unspecified organism [facility])  Abscess    ED Discharge Orders    None       Ollen Gross 07/03/17 2302    Daleen Bo, MD 07/04/17 [phone]

## 2017-07-03 NOTE — ED Provider Notes (Signed)
  Face-to-face evaluation   History: She presents for evaluation of buttocks abscess noticed on an evaluation by her gynecologist today while she was getting a Pap smear.  Patient noted a low-grade fever yesterday.  She is otherwise well.  Physical exam: Alert, calm, cooperative.  Left buttocks 5 x 6 cm mass, fluctuant and draining somewhat.  This area is moderately tender.  No other significant buttocks or perineal abscesses.  Mild skin breakdown bilateral buttocks.   Medical screening examination/treatment/procedure(s) were conducted as a shared visit with non-physician practitioner(s) and myself.  I personally evaluated the patient during the encounter    Daleen Bo, MD 07/04/17 [phone]

## 2017-07-03 NOTE — Progress Notes (Signed)
A consult was received from an ED physician for vanc/zosyn per pharmacy dosing.  The patient's profile has been reviewed for ht/wt/allergies/indication/available labs.   A one time order has been placed for vanc 1g and Zosyn 3.375g.  Further antibiotics/pharmacy consults should be ordered by admitting physician if indicated.                       Thank you, Kara Mead 07/03/2017  8:24 PM

## 2017-07-03 NOTE — Progress Notes (Signed)
Pt gave permission for her husband to remain in the room while questions were being asked on the nursing admission history. Lucius Conn BSN, RN-BC Admissions RN 07/03/2017 9:08 PM

## 2017-07-03 NOTE — ED Triage Notes (Signed)
Patient saw her PCP today and ws told she had a perirectal abscess. Patient c/o slight soreness and had a tinge of blood and white drainage last night. patient had a slight temperature in  The office today and was told to come to the ED.

## 2017-07-03 NOTE — ED Notes (Signed)
ED TO INPATIENT HANDOFF REPORT  Name/Age/Gender Allison Patel 62 y.o. female  Code Status Code Status History    This patient does not have a recorded code status. Please follow your organizational policy for patients in this situation.    Advance Directive Documentation     Most Recent Value  Type of Advance Directive  Living will  Pre-existing out of facility DNR order (yellow form or pink MOST form)  No data  "MOST" Form in Place?  No data      Home/SNF/Other Home  Chief Complaint Peri-area abcess needs drained, sent tby dr  Level of Care/Admitting Diagnosis ED Disposition    ED Disposition Condition Comment   Admit  Hospital Area: [place] [[id number]]  Level of Care: Telemetry [5]  Admit to tele based on following criteria: Monitor for Ischemic changes  Diagnosis: Sepsis [facility]) [[id number]]  Admitting Physician: Rise Patience [phone]  Attending Physician: Rise Patience [phone]  Estimated length of stay: past midnight tomorrow  Certification:: I certify this patient will need inpatient services for at least 2 midnights  PT Class (Do Not Modify): Inpatient [101]  PT Acc Code (Do Not Modify): Private [1]       Medical History Past Medical History:  Diagnosis Date  . Diabetes mellitus without complication ([place])   . Hyperlipidemia   . Hypertension   . Hypothyroidism     Allergies Allergies  Allergen Reactions  . Macrobid [Nitrofurantoin Macrocrystal] Rash    IV Location/Drains/Wounds Patient Lines/Drains/Airways Status   Active Line/Drains/Airways    Name:   Placement date:   Placement time:   Site:   Days:   Peripheral IV 07/03/17 Left Forearm   07/03/17    2019    Forearm   less than 1          Labs/Imaging Results for orders placed or performed during the hospital encounter of 07/03/17 (from the past 48 hour(s))  Comprehensive metabolic panel     Status: Abnormal   Collection Time: 07/03/17  8:13 PM  Result Value  Ref Range   Sodium 133 (L) 135 - 145 mmol/L   Potassium 4.2 3.5 - 5.1 mmol/L   Chloride 97 (L) 101 - 111 mmol/L   CO2 22 22 - 32 mmol/L   Glucose, Bld 321 (H) 65 - 99 mg/dL   BUN 18 6 - 20 mg/dL   Creatinine, Ser 0.81 0.44 - 1.00 mg/dL   Calcium 9.1 8.9 - 10.3 mg/dL   Total Protein 8.0 6.5 - 8.1 g/dL   Albumin 3.8 3.5 - 5.0 g/dL   AST 17 15 - 41 U/L   ALT 14 14 - 54 U/L   Alkaline Phosphatase 101 38 - 126 U/L   Total Bilirubin 1.6 (H) 0.3 - 1.2 mg/dL   GFR calc non Af Amer >60 >60 mL/min   GFR calc Af Amer >60 >60 mL/min    Comment: (NOTE) The eGFR has been calculated using the CKD EPI equation. This calculation has not been validated in all clinical situations. eGFR's persistently <60 mL/min signify possible Chronic Kidney Disease.    Anion gap 14 5 - 15    Comment: Performed at [facility], [place] [address], [place], [place] [address]  CBC with Differential     Status: Abnormal   Collection Time: 07/03/17  8:13 PM  Result Value Ref Range   WBC 15.9 (H) 4.0 - 10.5 K/uL   RBC 4.54 3.87 - 5.11 MIL/uL   Hemoglobin  13.5 12.0 - 15.0 g/dL   HCT 38.7 36.0 - 46.0 %   MCV 85.2 78.0 - 100.0 fL   MCH 29.7 26.0 - 34.0 pg   MCHC 34.9 30.0 - 36.0 g/dL   RDW 12.7 11.5 - 15.5 %   Platelets 255 150 - 400 K/uL   Neutrophils Relative % 80 %   Neutro Abs 12.7 (H) 1.7 - 7.7 K/uL   Lymphocytes Relative 9 %   Lymphs Abs 1.4 0.7 - 4.0 K/uL   Monocytes Relative 11 %   Monocytes Absolute 1.7 (H) 0.1 - 1.0 K/uL   Eosinophils Relative 0 %   Eosinophils Absolute 0.0 0.0 - 0.7 K/uL   Basophils Relative 0 %   Basophils Absolute 0.0 0.0 - 0.1 K/uL    Comment: Performed at [facility], [place] [address] [place], [place] [address]  I-Stat CG4 Lactic Acid, ED     Status: None   Collection Time: 07/03/17  8:20 PM  Result Value Ref Range   Lactic Acid, Venous 1.24 0.5 - 1.9 mmol/L  Urinalysis, Routine w reflex microscopic     Status: Abnormal   Collection Time:  07/03/17  8:58 PM  Result Value Ref Range   Color, Urine YELLOW YELLOW   APPearance HAZY (A) CLEAR   Specific Gravity, Urine 1.030 1.005 - 1.030   pH 5.0 5.0 - 8.0   Glucose, UA >=500 (A) NEGATIVE mg/dL   Hgb urine dipstick MODERATE (A) NEGATIVE   Bilirubin Urine NEGATIVE NEGATIVE   Ketones, ur 20 (A) NEGATIVE mg/dL   Protein, ur NEGATIVE NEGATIVE mg/dL   Nitrite NEGATIVE NEGATIVE   Leukocytes, UA LARGE (A) NEGATIVE   RBC / HPF 6-30 0 - 5 RBC/hpf   WBC, UA TOO NUMEROUS TO COUNT 0 - 5 WBC/hpf   Bacteria, UA NONE SEEN NONE SEEN   Squamous Epithelial / LPF 0-5 (A) NONE SEEN   Mucus PRESENT     Comment: Performed at [facility], [place] [address] [place], [place] [address]   Dg Chest Port 1 View  Result Date: 07/03/2017 CLINICAL DATA:  62 year old female with sepsis. EXAM: PORTABLE CHEST 1 VIEW COMPARISON:  None. FINDINGS: The lungs are clear. There is no pleural effusion or pneumothorax. Mild cardiomegaly. No acute osseous pathology. IMPRESSION: No active disease. Electronically Signed   By: Anner Crete M.D.   On: 07/03/2017 21:09    Pending Labs Unresulted Labs (From admission, onward)   Start     Ordered   07/03/17 2145  Aerobic Culture (superficial specimen)  Once,   R     07/03/17 2145   07/03/17 2021  Blood Culture (routine x 2)  BLOOD CULTURE X 2,   STAT     07/03/17 2021      Vitals/Pain Today's Vitals   07/03/17 2047 07/03/17 2101 07/03/17 2130 07/03/17 2200  BP:  (!) 157/79 (!) 166/67 (!) 168/78  Pulse:  (!) 118 (!) 110 (!) 111  Resp:  18 (!) 23 (!) 25  Temp: (!) 102.2 F (39 C)     TempSrc: Rectal     SpO2:  96% 95% 95%  Weight:      Height:      PainSc:        Isolation Precautions No active isolations  Medications Medications  vancomycin (VANCOCIN) IVPB 1000 mg/200 mL premix (1,000 mg Intravenous New Bag/Given 07/03/17 2158)  Tdap (BOOSTRIX) injection 0.5 mL (not administered)  piperacillin-tazobactam (ZOSYN) IVPB 3.375 g (0 g  Intravenous  Stopped 07/03/17 2145)  sodium chloride 0.9 % bolus 1,000 mL (0 mLs Intravenous Stopped 07/03/17 2207)  lidocaine-EPINEPHrine (XYLOCAINE W/EPI) 2 %-1:200000 (PF) injection 10 mL (10 mLs Infiltration Given by Other 07/03/17 2100)    Mobility walks with person assist

## 2017-07-03 NOTE — Progress Notes (Signed)
Subjective:    Patient ID: Allison Patel, female    DOB: 10/03/1955, 62 y.o.   MRN: [id number]  HPI 62 year old Female presents to the office to establish for Primary Care.  Formally seen by Dr. Brigitte Pulse at [facility].  History of insulin-dependent diabetes mellitus seen by Dr. Buddy Duty.  We noted that she was febrile in the office today with temp of 101.5.  Says that she thinks she has a boil in her perineal area.  Indeed, she has a large abscess in the right perineal area that is draining.  Her Accu-Chek today here in the office is  441. No ketones in urine.  She stopped taking her Humalog Mix 75/25 several days ago because she was told it would cost $300.  Hemoglobin A1c done here recently is greater than 14%.  She is taking Janumet and Invokana.  Says that she can only take 1 Janumet  50/1000 tablet daily because it causes diarrhea.  Patient was spending a lot of time at the hospital  and Hospice last week as her brother who was 48 years old was dying of pancreatic cancer.  He subsequently passed and his funeral service is   this coming Saturday, March 16.  She has family coming in from [place] later today.  Social history: She is a Technical sales engineer at [employer].  Husband is an Occupational hygienist.  She completed 2 years of college.  Does not smoke or consume alcohol.  They live in [place].  She has 2 adult daughters ages 81 and 67 respectively.  Family history: Father died at age 87 with complications of pneumonia.  Mother died at age 74 with congestive heart failure.  Brother age 35 in good health.  No sisters.  With regard to B12 level it is 263.  TSH is 2.38.  Vitamin D level is 9, fasting glucose is 284.  BUN 14.  Creatinine 0.84.  Vitamin D is 9.  Recent lipid panel total cholesterol 218, HDL 50, triglycerides 147 and LDL cholesterol 141.  She is obese.  Weight in July was 155.8 pounds.  Old records indicate she has a history of osteopenia  with a stress fracture of her pelvis.  History of hypertrophic cardiomyopathy previously seen by Dr. Pernell Dupre.  Zostavax done in 2015, Tdap 2014, Pneumovax 2012.  She is status post TAH/BSO.  Pap taken today of vaginal cuff.  Apparently not seen by Dr. Buddy Duty since 2017  She has a history of hyperlipidemia and hypothyroidism.  History of chronic kidney disease.  Records from [facility] indicate she has been taking Bystolic 20 mg daily, Cartia XT extended release 300 mg daily, Micardis HCT 80/12.5 mg 2 tablets daily.  Lisinopril 10 mg daily, clonidine 0.2 mg 1 tablet twice a day.  She is on Zetia 10 mg daily and Crestor 40 mg daily.  She is allergic to Macrobid it causes a rash.  Lisinopril    Review of Systems denies shaking chills.  Did not realize she was febrile until she arrived here.     Objective:   Physical Exam Neck is supple.  Chest clear.  2/6 systolic ejection murmur.  She is tachycardic.  No lower extremity edema.  She has a large fluctuant mass in right perineal area that is draining.  Pap taken of vaginal cuff.  No masses on bimanual exam.       Assessment & Plan:  Perineal abscess that needs urgent drainage.  I am concerned she could become  septic with history of diabetes.  She is febrile.  She needs drainage of this abscess and IV antibiotics.  Poorly controlled diabetes mellitus with recent hemoglobin A1c greater than 14%.  This is the first time I have seen her today.  She needs an affordable regimen to will control her diabetes.  She is not ketotic.  Hypothyroidism-TSH is normal  History of hyperlipidemia treated with Zetia and Crestor  History of hypertensive heart disease  History of hypertrophic cardiomyopathy  Health maintenance-last mammogram was April 2018  History of insomnia  Osteopenia-bone density study done 07/19/2013 lowest T score was -1.4 in the lumbar spine.  Left femoral neck -1.7.  Right femoral neck -1.5.  Situational stress with brother's  recent death.  Mother died in 07-19-2016.  Patient may be mildly depressed.  History of mild elevated liver functions may have been due to fatty liver but was on Zetia and Crestor at the time.  Recent  C met here shows normal liver functions.  Once she is discharged from the hospital she will need further follow-up here.

## 2017-07-04 ENCOUNTER — Other Ambulatory Visit: Payer: Self-pay

## 2017-07-04 ENCOUNTER — Encounter (HOSPITAL_COMMUNITY): Payer: Self-pay | Admitting: Radiology

## 2017-07-04 ENCOUNTER — Inpatient Hospital Stay (HOSPITAL_COMMUNITY): Payer: No Typology Code available for payment source

## 2017-07-04 ENCOUNTER — Encounter: Payer: No Typology Code available for payment source | Admitting: Internal Medicine

## 2017-07-04 DIAGNOSIS — E1165 Type 2 diabetes mellitus with hyperglycemia: Secondary | ICD-10-CM

## 2017-07-04 DIAGNOSIS — E871 Hypo-osmolality and hyponatremia: Secondary | ICD-10-CM

## 2017-07-04 DIAGNOSIS — A419 Sepsis, unspecified organism: Principal | ICD-10-CM

## 2017-07-04 DIAGNOSIS — I10 Essential (primary) hypertension: Secondary | ICD-10-CM

## 2017-07-04 LAB — CBC WITH DIFFERENTIAL/PLATELET
BASOS PCT: 0 %
Basophils Absolute: 0 10*3/uL (ref 0.0–0.1)
Eosinophils Absolute: 0 10*3/uL (ref 0.0–0.7)
Eosinophils Relative: 0 %
HEMATOCRIT: 34 % — AB (ref 36.0–46.0)
HEMOGLOBIN: 11.3 g/dL — AB (ref 12.0–15.0)
LYMPHS ABS: 2 10*3/uL (ref 0.7–4.0)
LYMPHS PCT: 17 %
MCH: 28.7 pg (ref 26.0–34.0)
MCHC: 33.2 g/dL (ref 30.0–36.0)
MCV: 86.3 fL (ref 78.0–100.0)
MONO ABS: 1.2 10*3/uL — AB (ref 0.1–1.0)
MONOS PCT: 10 %
NEUTROS ABS: 8.6 10*3/uL — AB (ref 1.7–7.7)
NEUTROS PCT: 73 %
Platelets: 211 10*3/uL (ref 150–400)
RBC: 3.94 MIL/uL (ref 3.87–5.11)
RDW: 12.7 % (ref 11.5–15.5)
WBC: 11.9 10*3/uL — ABNORMAL HIGH (ref 4.0–10.5)

## 2017-07-04 LAB — URINE CULTURE
MICRO NUMBER:: 90325395
SPECIMEN QUALITY: ADEQUATE

## 2017-07-04 LAB — GLUCOSE, CAPILLARY
GLUCOSE-CAPILLARY: 224 mg/dL — AB (ref 65–99)
GLUCOSE-CAPILLARY: 142 mg/dL — AB (ref 65–99)
GLUCOSE-CAPILLARY: 192 mg/dL — AB (ref 65–99)
Glucose-Capillary: 189 mg/dL — ABNORMAL HIGH (ref 65–99)

## 2017-07-04 LAB — HIV ANTIBODY (ROUTINE TESTING W REFLEX): HIV Screen 4th Generation wRfx: NONREACTIVE

## 2017-07-04 LAB — LACTIC ACID, PLASMA: Lactic Acid, Venous: 0.9 mmol/L (ref 0.5–1.9)

## 2017-07-04 LAB — PROCALCITONIN: PROCALCITONIN: 0.16 ng/mL

## 2017-07-04 MED ORDER — VANCOMYCIN HCL IN DEXTROSE 750-5 MG/150ML-% IV SOLN
750.0000 mg | INTRAVENOUS | Status: DC
  Administered 2017-07-04 – 2017-07-05 (×2): 750 mg via INTRAVENOUS
  Filled 2017-07-04 (×2): qty 150

## 2017-07-04 MED ORDER — INSULIN ASPART 100 UNIT/ML ~~LOC~~ SOLN: 0.0000 [IU] | Freq: Every day | SUBCUTANEOUS | Status: DC

## 2017-07-04 MED ORDER — IOPAMIDOL (ISOVUE-300) INJECTION 61%
INTRAVENOUS | Status: AC
  Filled 2017-07-04: qty 100

## 2017-07-04 MED ORDER — IOPAMIDOL (ISOVUE-300) INJECTION 61%
100.0000 mL | Freq: Once | INTRAVENOUS | Status: AC | PRN
  Administered 2017-07-04: 100 mL via INTRAVENOUS

## 2017-07-04 MED ORDER — INSULIN GLARGINE 100 UNIT/ML ~~LOC~~ SOLN
7.0000 [IU] | Freq: Two times a day (BID) | SUBCUTANEOUS | Status: DC
  Administered 2017-07-04 – 2017-07-05 (×3): 7 [IU] via SUBCUTANEOUS
  Filled 2017-07-04 (×5): qty 0.07

## 2017-07-04 MED ORDER — SODIUM CHLORIDE 0.9 % IV SOLN: 1.0000 g | INTRAVENOUS | Status: DC

## 2017-07-04 MED ORDER — INSULIN ASPART 100 UNIT/ML ~~LOC~~ SOLN
0.0000 [IU] | Freq: Three times a day (TID) | SUBCUTANEOUS | Status: DC
  Administered 2017-07-04: 2 [IU] via SUBCUTANEOUS
  Administered 2017-07-04: 5 [IU] via SUBCUTANEOUS
  Administered 2017-07-05: 3 [IU] via SUBCUTANEOUS
  Administered 2017-07-05: 2 [IU] via SUBCUTANEOUS
  Administered 2017-07-05 – 2017-07-06 (×2): 3 [IU] via SUBCUTANEOUS
  Administered 2017-07-06: 5 [IU] via SUBCUTANEOUS
  Administered 2017-07-06 – 2017-07-07 (×2): 3 [IU] via SUBCUTANEOUS

## 2017-07-04 MED ORDER — INSULIN ASPART 100 UNIT/ML ~~LOC~~ SOLN
3.0000 [IU] | Freq: Three times a day (TID) | SUBCUTANEOUS | Status: DC
  Administered 2017-07-04 – 2017-07-07 (×9): 3 [IU] via SUBCUTANEOUS

## 2017-07-04 MED ORDER — PIPERACILLIN-TAZOBACTAM 3.375 G IVPB
3.3750 g | Freq: Three times a day (TID) | INTRAVENOUS | Status: DC
  Administered 2017-07-04 – 2017-07-06 (×6): 3.375 g via INTRAVENOUS
  Filled 2017-07-04 (×6): qty 50

## 2017-07-04 MED ORDER — PIPERACILLIN-TAZOBACTAM 3.375 G IVPB
3.3750 g | Freq: Three times a day (TID) | INTRAVENOUS | Status: DC
  Administered 2017-07-04: 3.375 g via INTRAVENOUS
  Filled 2017-07-04: qty 50

## 2017-07-04 MED ORDER — CHLORHEXIDINE GLUCONATE 0.12 % MT SOLN
15.0000 mL | Freq: Two times a day (BID) | OROMUCOSAL | Status: DC
  Administered 2017-07-04 – 2017-07-07 (×4): 15 mL via OROMUCOSAL
  Filled 2017-07-04 (×4): qty 15

## 2017-07-04 MED ORDER — ORAL CARE MOUTH RINSE: 15.0000 mL | Freq: Two times a day (BID) | OROMUCOSAL | Status: DC

## 2017-07-04 NOTE — Progress Notes (Signed)
[facility] PROGRESS NOTE    Progress Note  Allison Patel  OQH:[id number] DOB: [birth year] DOA: 07/03/2017 PCP: Elby Showers, MD     Brief Narrative:   Allison Patel is an 62 y.o. female past medical history of diabetes mellitus type 2, went and saw and was found febrile he relates he is been having fevers for the last 3 days with a drainage from his left buttock, during this time his sugar was also found to be elevated with ketones in urine, she has not been able to take her insulin for the last 3 weeks as it is expensive  Assessment/Plan:   Sepsis ([place]) due to left buttock abscess: Status post incision and drainage.  CT scan of the abdomen and pelvis to rule out deep or perirectal abscess is pending. Will continue IV empiric antibiotics.  Culture data is pending.  She has remained afebrile.  Uncontrolled diabetes mellitus: Likely due to noncompliance Continue Januvia, long-acting insulin and sliding scale.  Hypovolemia hyponatremia: Likely to to hypovolemia continue IV fluid hydration.  Uncontrolled essential hypertension: Continue current home medications blood pressure seems to be stable.   DVT prophylaxis: lovenox Family Communication:none home in 2 days Disposition Plan/Barrier to D/C: Once culture data is back. Code Status:     Code Status Orders  (From admission, onward)        Start     Ordered   07/03/17 2353  Full code  Continuous     07/03/17 2355    Code Status History    Date Active Date Inactive Code Status Order ID Comments User Context   This patient has a current code status but no historical code status.    Advance Directive Documentation     Most Recent Value  Type of Advance Directive  Living will  Pre-existing out of facility DNR order (yellow form or pink MOST form)  No data  "MOST" Form in Place?  No data        IV Access:    Peripheral IV   Procedures and diagnostic studies:   Dg Chest Port 1 View  Result Date:  07/03/2017 CLINICAL DATA:  62 year old female with sepsis. EXAM: PORTABLE CHEST 1 VIEW COMPARISON:  None. FINDINGS: The lungs are clear. There is no pleural effusion or pneumothorax. Mild cardiomegaly. No acute osseous pathology. IMPRESSION: No active disease. Electronically Signed   By: Anner Crete M.D.   On: 07/03/2017 21:09     Medical Consultants:    None.  Anti-Infectives:   IV vancomycin and Zosyn  Subjective:    Allison Patel relates her pain is controlled.  Objective:    Vitals:   07/03/17 2230 07/03/17 2245 07/03/17 2303 07/04/17 0509  BP:   (!) 173/74 (!) 154/76  Pulse: (!) 109 (!) 107 (!) 115 89  Resp: (!) 22 (!) 25 18 20   Temp:   99.2 F (37.3 C) 98.2 F (36.8 C)  TempSrc:   Oral Oral  SpO2: 95% 95% 98% 98%  Weight:   69.9 kg (154 lb 1.6 oz)   Height:   4' 11.5" (1.511 m)     Intake/Output Summary (Last 24 hours) at 07/04/2017 0814 Last data filed at 07/04/2017 0600 Gross per 24 hour  Intake 1611.67 ml  Output -  Net 1611.67 ml   Filed Weights   07/03/17 1732 07/03/17 2303  Weight: 69.9 kg (154 lb) 69.9 kg (154 lb 1.6 oz)    Exam: General exam: In no acute distress. Respiratory system:  Good air movement and clear to auscultation. Cardiovascular system: S1 & S2 heard, RRR.  Gastrointestinal system: Abdomen is nondistended, soft and nontender.  Central nervous system: Alert and oriented. No focal neurological deficits. Extremities: No pedal edema. Skin: No rashes, lesions or ulcers   Data Reviewed:    Labs: Basic Metabolic Panel: Recent Labs  Lab 07/03/17 2013  NA 133*  K 4.2  CL 97*  CO2 22  GLUCOSE 321*  BUN 18  CREATININE 0.81  CALCIUM 9.1   GFR Estimated Creatinine Clearance: 62.9 mL/min (by C-G formula based on SCr of 0.81 mg/dL). Liver Function Tests: Recent Labs  Lab 07/03/17 2013  AST 17  ALT 14  ALKPHOS 101  BILITOT 1.6*  PROT 8.0  ALBUMIN 3.8   No results for input(s): LIPASE, AMYLASE in the last 168  hours. No results for input(s): AMMONIA in the last 168 hours. Coagulation profile No results for input(s): INR, PROTIME in the last 168 hours.  CBC: Recent Labs  Lab 07/03/17 2013 07/04/17 0511  WBC 15.9* 11.9*  NEUTROABS 12.7* 8.6*  HGB 13.5 11.3*  HCT 38.7 34.0*  MCV 85.2 86.3  PLT 255 211   Cardiac Enzymes: No results for input(s): CKTOTAL, CKMB, CKMBINDEX, TROPONINI in the last 168 hours. BNP (last 3 results) No results for input(s): PROBNP in the last 8760 hours. CBG: Recent Labs  Lab 07/04/17 0745  GLUCAP 189*   D-Dimer: No results for input(s): DDIMER in the last 72 hours. Hgb A1c: No results for input(s): HGBA1C in the last 72 hours. Lipid Profile: No results for input(s): CHOL, HDL, LDLCALC, TRIG, CHOLHDL, LDLDIRECT in the last 72 hours. Thyroid function studies: No results for input(s): TSH, T4TOTAL, T3FREE, THYROIDAB in the last 72 hours.  Invalid input(s): FREET3 Anemia work up: No results for input(s): VITAMINB12, FOLATE, FERRITIN, TIBC, IRON, RETICCTPCT in the last 72 hours. Sepsis Labs: Recent Labs  Lab 07/03/17 2013 07/03/17 2020 07/04/17 0511  PROCALCITON  --   --  0.16  WBC 15.9*  --  11.9*  LATICACIDVEN  --  1.24 0.9   Microbiology Recent Results (from the past 240 hour(s))  Aerobic Culture (superficial specimen)     Status: None (Preliminary result)   Collection Time: 07/03/17  9:45 PM  Result Value Ref Range Status   Specimen Description   Final    ABSCESS RECTUM Performed at [facility] [address], [place], [place] [address]    Special Requests   Final    NONE Performed at [facility], [place] [address], [place], [place] [address]    Gram Stain   Final    FEW WBC PRESENT, PREDOMINANTLY PMN MODERATE GRAM POSITIVE COCCI RARE GRAM NEGATIVE RODS Performed at [facility], [place] [address] [place], [place] [address]    Culture PENDING  Incomplete   Report Status PENDING  Incomplete       Medications:   . chlorhexidine  15 mL Mouth Rinse BID  . cloNIDine  0.2 mg Oral BID  . enoxaparin (LOVENOX) injection  40 mg Subcutaneous QHS  . insulin aspart  0-9 Units Subcutaneous TID WC  . insulin glargine  10 Units Subcutaneous QHS  . iopamidol      . lisinopril  10 mg Oral Daily  . mouth rinse  15 mL Mouth Rinse q12n4p  . rosuvastatin  40 mg Oral Daily   Continuous Infusions: . sodium chloride 100 mL/hr at 07/04/17 0053  . piperacillin-tazobactam (ZOSYN)  IV 3.375  g (07/04/17 0538)  . vancomycin       LOS: 1 day   Charlynne Cousins  [facility] Pager [phone]  *Please refer to [email or web address], password [email or web address] to get updated schedule on who will round on this patient, as hospitalists switch teams weekly. If 7PM-7AM, please contact night-coverage at [email or web address], password [email or web address] for any overnight needs.  07/04/2017, 8:14 AM

## 2017-07-04 NOTE — Progress Notes (Signed)
Pharmacy Antibiotic Note  Allison Patel is a 62 y.o. female admitted on 07/03/2017 with sepsis.  Pharmacy has been consulted for Vancomycin and Zosyn dosing.  Plan: Zosyn 3.375g IV q8h (4 hour infusion).   Vancomycin 1gm iv x1, then 750mg  iv q24hr Goal AUC = 400 - 500 for all indications, except meningitis (goal AUC > 500 and Cmin 15-20 mcg/mL)   Height: 4' 11.5" (151.1 cm) Weight: 154 lb 1.6 oz (69.9 kg) IBW/kg (Calculated) : 44.35  Temp (24hrs), Avg:100.2 F (37.9 C), Min:98.2 F (36.8 C), Max:102.2 F (39 C)  Recent Labs  Lab 07/03/17 2013 07/03/17 2020 07/04/17 0511  WBC 15.9*  --  11.9*  CREATININE 0.81  --   --   LATICACIDVEN  --  1.24  --     Estimated Creatinine Clearance: 62.9 mL/min (by C-G formula based on SCr of 0.81 mg/dL).    Allergies  Allergen Reactions  . Macrobid [Nitrofurantoin Macrocrystal] Rash    Antimicrobials this admission: Vancomycin 07/03/2017 >> Zosyn 07/03/2017 >>   Dose adjustments this admission: -  Microbiology results: pending  Thank you for allowing pharmacy to be a part of this patient's care.  Allison Patel 07/04/2017 5:32 AM

## 2017-07-04 NOTE — Consult Note (Signed)
[facility] nurse consulted for buttock wound, after review of the chart she had an I&D in the ED yesterday for buttock abscess.  CT of this area is normal. Will need site packed with 1/4" iodoform packing daily. Orders updated in the computer.  Will need follow up with PCP to evaluate site in a week.  Discussed POC with patient and bedside nurse.  Re consult if needed, will not follow at this time. Thanks  Lona Six [employer], RN,CWOCN, CNS, [place] [phone])

## 2017-07-05 LAB — CREATININE, SERUM: Creatinine, Ser: 0.86 mg/dL (ref 0.44–1.00)

## 2017-07-05 LAB — GLUCOSE, CAPILLARY
GLUCOSE-CAPILLARY: 173 mg/dL — AB (ref 65–99)
Glucose-Capillary: 132 mg/dL — ABNORMAL HIGH (ref 65–99)
Glucose-Capillary: 168 mg/dL — ABNORMAL HIGH (ref 65–99)
Glucose-Capillary: 153 mg/dL — ABNORMAL HIGH (ref 65–99)

## 2017-07-05 NOTE — Progress Notes (Signed)
[facility] PROGRESS NOTE    Progress Note  Allison Patel  ZOX:[id number] DOB: 21-Jul-1955 DOA: 07/03/2017 PCP: Elby Showers, MD     Brief Narrative:   Allison Patel is an 62 y.o. female past medical history of diabetes mellitus type 2, went and saw and was found febrile he relates he is been having fevers for the last 3 days with a drainage from his left buttock, during this time his sugar was also found to be elevated with ketones in urine, she has not been able to take her insulin for the last 3 weeks as it is expensive  Assessment/Plan:   Sepsis ([place]) due to left buttock abscess: Status post incision and drainage.  CT scan of the abdomen and pelvis to rule out deep or perirectal abscess. Ultra data continue to be negative, continue IV empiric antibiotics for an additional 24 hours.  Has remained afebrile  Uncontrolled diabetes mellitus: Blood glucose has remained stable in the hospital Continue Januvia, long-acting insulin and sliding scale.  Hypovolemia hyponatremia: Resolved with IV hydration.  Uncontrolled essential hypertension: Continue current home medications blood pressure seems to be stable.   DVT prophylaxis: lovenox Family Communication:none home in 2 days Disposition Plan/Barrier to D/C: Probably home in the morning. Code Status:     Code Status Orders  (From admission, onward)        Start     Ordered   07/03/17 2353  Full code  Continuous     07/03/17 2355    Code Status History    Date Active Date Inactive Code Status Order ID Comments User Context   This patient has a current code status but no historical code status.    Advance Directive Documentation     Most Recent Value  Type of Advance Directive  Living will  Pre-existing out of facility DNR order (yellow form or pink MOST form)  No data  "MOST" Form in Place?  No data        IV Access:    Peripheral IV   Procedures and diagnostic studies:   Ct Pelvis W  Contrast  Result Date: 07/04/2017 CLINICAL DATA:  Recent drainage of perineal abscess. EXAM: CT PELVIS WITH CONTRAST TECHNIQUE: Multidetector CT imaging of the pelvis was performed using the standard protocol following the bolus administration of intravenous contrast. CONTRAST:  181mL ISOVUE-300 IOPAMIDOL (ISOVUE-300) INJECTION 61% COMPARISON:  None. FINDINGS: Urinary Tract: No distal ureteral dilatation or calculi. Unremarkable bladder. Bowel:  Unremarkable visualized pelvic bowel loops. Vascular/Lymphatic: No significant vascular findings are evident. No enlarged lymph nodes are seen. Reproductive:  Status post hysterectomy.  No adnexal mass. Other: No intraperitoneal free fluid. There is moderate diffuse inflammatory stranding in the subcutaneous fat throughout the inferior buttock region just right of midline extending to the perianal region. There are a couple locules of gas consistent with history of recent incision and drainage. No residual fluid collection is present. Musculoskeletal: Old, healed left superior and inferior pubic rami fractures. IMPRESSION: Subcutaneous inflammation in the inferior right buttock/posterior perineal region compatible with cellulitis. No residual drainable abscess. Electronically Signed   By: Logan Bores M.D.   On: 07/04/2017 08:43   Dg Chest Port 1 View  Result Date: 07/03/2017 CLINICAL DATA:  62 year old female with sepsis. EXAM: PORTABLE CHEST 1 VIEW COMPARISON:  None. FINDINGS: The lungs are clear. There is no pleural effusion or pneumothorax. Mild cardiomegaly. No acute osseous pathology. IMPRESSION: No active disease. Electronically Signed   By: Laren Everts.D.  On: 07/03/2017 21:09     Medical Consultants:    None.  Anti-Infectives:   IV vancomycin and Zosyn  Subjective:    Allison Patel relates her pain is controlled.  Was in the bathroom she relates no new complains..  Objective:    Vitals:   07/04/17 0509 07/04/17 1528 07/04/17  2125 07/05/17 0559  BP: (!) 154/76 136/72 135/63 (!) 145/94  Pulse: 89 83 78 80  Resp: 20 20 20 18   Temp: 98.2 F (36.8 C) 99 F (37.2 C) 99.4 F (37.4 C) 98.1 F (36.7 C)  TempSrc: Oral Oral Oral Oral  SpO2: 98% 97% 97% 99%  Weight:      Height:        Intake/Output Summary (Last 24 hours) at 07/05/2017 0758 Last data filed at 07/05/2017 0057 Gross per 24 hour  Intake 250 ml  Output -  Net 250 ml   Filed Weights   07/03/17 1732 07/03/17 2303  Weight: 69.9 kg (154 lb) 69.9 kg (154 lb 1.6 oz)    Exam: General exam: In no acute distress. Respiratory system: Good air movement and clear to auscultation. Cardiovascular system: S1 & S2 heard, RRR.  Gastrointestinal system: Abdomen is nondistended, soft and nontender.  Central nervous system: Alert and oriented. No focal neurological deficits. Extremities: No pedal edema. Skin: No rashes, lesions or ulcers   Data Reviewed:    Labs: Basic Metabolic Panel: Recent Labs  Lab 07/03/17 2013 07/05/17 0522  NA 133*  --   K 4.2  --   CL 97*  --   CO2 22  --   GLUCOSE 321*  --   BUN 18  --   CREATININE 0.81 0.86  CALCIUM 9.1  --    GFR Estimated Creatinine Clearance: 59.2 mL/min (by C-G formula based on SCr of 0.86 mg/dL). Liver Function Tests: Recent Labs  Lab 07/03/17 2013  AST 17  ALT 14  ALKPHOS 101  BILITOT 1.6*  PROT 8.0  ALBUMIN 3.8   No results for input(s): LIPASE, AMYLASE in the last 168 hours. No results for input(s): AMMONIA in the last 168 hours. Coagulation profile No results for input(s): INR, PROTIME in the last 168 hours.  CBC: Recent Labs  Lab 07/03/17 2013 07/04/17 0511  WBC 15.9* 11.9*  NEUTROABS 12.7* 8.6*  HGB 13.5 11.3*  HCT 38.7 34.0*  MCV 85.2 86.3  PLT 255 211   Cardiac Enzymes: No results for input(s): CKTOTAL, CKMB, CKMBINDEX, TROPONINI in the last 168 hours. BNP (last 3 results) No results for input(s): PROBNP in the last 8760 hours. CBG: Recent Labs  Lab  07/04/17 0745 07/04/17 1210 07/04/17 1713 07/04/17 2111 07/05/17 0724  GLUCAP 189* 224* 142* 192* 132*   D-Dimer: No results for input(s): DDIMER in the last 72 hours. Hgb A1c: No results for input(s): HGBA1C in the last 72 hours. Lipid Profile: No results for input(s): CHOL, HDL, LDLCALC, TRIG, CHOLHDL, LDLDIRECT in the last 72 hours. Thyroid function studies: No results for input(s): TSH, T4TOTAL, T3FREE, THYROIDAB in the last 72 hours.  Invalid input(s): FREET3 Anemia work up: No results for input(s): VITAMINB12, FOLATE, FERRITIN, TIBC, IRON, RETICCTPCT in the last 72 hours. Sepsis Labs: Recent Labs  Lab 07/03/17 2013 07/03/17 2020 07/04/17 0511  PROCALCITON  --   --  0.16  WBC 15.9*  --  11.9*  LATICACIDVEN  --  1.24 0.9   Microbiology Recent Results (from the past 240 hour(s))  Urine Culture     Status: None  Collection Time: 07/03/17  4:02 PM  Result Value Ref Range Status   MICRO NUMBER: [id number]  Final   SPECIMEN QUALITY: ADEQUATE  Final   Sample Source URINE  Final   STATUS: FINAL  Final   ISOLATE 1:   Final    Multiple organisms present, each less than 10,000 CFU/mL. These organisms, commonly found on external and internal genitalia, are considered to be colonizers. No further testing performed.  Blood Culture (routine x 2)     Status: None (Preliminary result)   Collection Time: 07/03/17  8:33 PM  Result Value Ref Range Status   Specimen Description   Final    BLOOD LEFT WRIST Performed at [facility] [address] [place], [place] [address]    Special Requests   Final    BOTTLES DRAWN AEROBIC AND ANAEROBIC Blood Culture adequate volume Performed at [facility] [address], [place], [place] [address]    Culture   Final    NO GROWTH < 24 HOURS Performed at [facility] [address], [place], [place] [address]    Report Status PENDING  Incomplete  Blood Culture (routine x 2)     Status: None  (Preliminary result)   Collection Time: 07/03/17  8:40 PM  Result Value Ref Range Status   Specimen Description   Final    BLOOD RIGHT WRIST Performed at [facility] [address] [place], [place] [address]    Special Requests   Final    BOTTLES DRAWN AEROBIC ONLY Blood Culture adequate volume Performed at [facility] [address], [place], [place] [address]    Culture   Final    NO GROWTH < 24 HOURS Performed at [facility] [address], [place], [place] [address]    Report Status PENDING  Incomplete  Aerobic Culture (superficial specimen)     Status: None (Preliminary result)   Collection Time: 07/03/17  9:45 PM  Result Value Ref Range Status   Specimen Description   Final    ABSCESS RECTUM Performed at [facility] [address] [place], [place] [address]    Special Requests   Final    NONE Performed at [facility], [place] [address], [place], [place] [address]    Gram Stain   Final    FEW WBC PRESENT, PREDOMINANTLY PMN MODERATE GRAM POSITIVE COCCI RARE GRAM NEGATIVE RODS Performed at [facility], [place] [address], [place],  [address]    Culture PENDING  Incomplete   Report Status PENDING  Incomplete     Medications:   . chlorhexidine  15 mL Mouth Rinse BID  . cloNIDine  0.2 mg Oral BID  . enoxaparin (LOVENOX) injection  40 mg Subcutaneous QHS  . insulin aspart  0-15 Units Subcutaneous TID WC  . insulin aspart  0-5 Units Subcutaneous QHS  . insulin aspart  3 Units Subcutaneous TID WC  . insulin glargine  7 Units Subcutaneous BID  . lisinopril  10 mg Oral Daily  . mouth rinse  15 mL Mouth Rinse q12n4p  . rosuvastatin  40 mg Oral Daily   Continuous Infusions: . piperacillin-tazobactam (ZOSYN)  IV 3.375 g (07/05/17 0557)  . vancomycin Stopped (07/04/17 1946)     LOS: 2 days   [facility] Pager [phone]  *Please refer to  [email or web address], password [email or web address] to get updated schedule on who will round on this  patient, as hospitalists switch teams weekly. If 7PM-7AM, please contact night-coverage at [email or web address], password [email or web address] for any overnight needs.  07/05/2017, 7:58 AM

## 2017-07-06 LAB — AEROBIC CULTURE  (SUPERFICIAL SPECIMEN)

## 2017-07-06 LAB — CREATININE, SERUM
CREATININE: 0.84 mg/dL (ref 0.44–1.00)
GFR calc Af Amer: >60 mL/min (ref >60)
GFR calc non Af Amer: >60 mL/min (ref >60)

## 2017-07-06 LAB — AEROBIC CULTURE W GRAM STAIN (SUPERFICIAL SPECIMEN)

## 2017-07-06 LAB — GLUCOSE, CAPILLARY
GLUCOSE-CAPILLARY: 178 mg/dL — AB (ref 65–99)
GLUCOSE-CAPILLARY: 205 mg/dL — AB (ref 65–99)
Glucose-Capillary: 158 mg/dL — ABNORMAL HIGH (ref 65–99)
Glucose-Capillary: 194 mg/dL — ABNORMAL HIGH (ref 65–99)

## 2017-07-06 MED ORDER — AMPICILLIN-SULBACTAM SODIUM 1.5 (1-0.5) G IJ SOLR
1.5000 g | Freq: Four times a day (QID) | INTRAMUSCULAR | Status: DC
  Administered 2017-07-06 – 2017-07-07 (×4): 1.5 g via INTRAVENOUS
  Filled 2017-07-06 (×5): qty 1.5

## 2017-07-06 MED ORDER — INSULIN GLARGINE 100 UNIT/ML ~~LOC~~ SOLN
10.0000 [IU] | Freq: Two times a day (BID) | SUBCUTANEOUS | Status: DC
  Administered 2017-07-06 – 2017-07-07 (×3): 10 [IU] via SUBCUTANEOUS
  Filled 2017-07-06 (×3): qty 0.1

## 2017-07-06 NOTE — Progress Notes (Signed)
[facility] PROGRESS NOTE    Progress Note  Allison Patel  FYB:[id number] DOB: 1956/01/14 DOA: 07/03/2017 PCP: Elby Showers, MD     Brief Narrative:   Allison Patel is an 62 y.o. female past medical history of diabetes mellitus type 2, went and saw and was found febrile he relates he is been having fevers for the last 3 days with a drainage from his left buttock, during this time his sugar was also found to be elevated with ketones in urine, she has not been able to take her insulin for the last 3 weeks as it is expensive  Assessment/Plan:   Sepsis ([place]) due to left buttock abscess: Status post incision and drainage.  CT scan of the abdomen and pelvis did not show deep or perirectal abscess. Ultra data continue to be negative, she continues to have significant erythema around her vulva and buttock with significant drainage. Wound care was consulted, they recommended packing daily. Will continue IV empiric antibiotics.  Uncontrolled diabetes mellitus: Blood glucose has remained stable in the hospital Continue Januvia, long-acting insulin and sliding scale.  Hypovolemia hyponatremia: Resolved with IV hydration.  Uncontrolled essential hypertension: Continue current home medications blood pressure seems to be stable.   DVT prophylaxis: lovenox Family Communication:none home in 2 days Disposition Plan/Barrier to D/C: Probably home in the morning. Code Status:     Code Status Orders  (From admission, onward)        Start     Ordered   07/03/17 2353  Full code  Continuous     07/03/17 2355    Code Status History    Date Active Date Inactive Code Status Order ID Comments User Context   This patient has a current code status but no historical code status.    Advance Directive Documentation     Most Recent Value  Type of Advance Directive  Living will  Pre-existing out of facility DNR order (yellow form or pink MOST form)  No data  "MOST" Form in Place?  No  data        IV Access:    Peripheral IV   Procedures and diagnostic studies:   No results found.   Medical Consultants:    None.  Anti-Infectives:   IV vancomycin and Zosyn  Subjective:    Allison Patel relates her pain is controlled.    Objective:    Vitals:   07/05/17 0559 07/05/17 1351 07/05/17 2138 07/06/17 0402  BP: (!) 145/94 (!) 144/59 (!) 169/80 138/71  Pulse: 80 87 89 79  Resp: 18 16 16 18   Temp: 98.1 F (36.7 C) 98.5 F (36.9 C) 99.5 F (37.5 C) 99.6 F (37.6 C)  TempSrc: Oral Oral Oral Oral  SpO2: 99% 99% 94% 94%  Weight:      Height:        Intake/Output Summary (Last 24 hours) at 07/06/2017 1040 Last data filed at 07/06/2017 5277 Gross per 24 hour  Intake 710 ml  Output -  Net 710 ml   Filed Weights   07/03/17 1732 07/03/17 2303  Weight: 69.9 kg (154 lb) 69.9 kg (154 lb 1.6 oz)    Exam: General exam: In no acute distress. Respiratory system: Good air movement and clear to auscultation. Cardiovascular system: S1 & S2 heard, RRR.  Gastrointestinal system: Abdomen is nondistended, soft and nontender.  Central nervous system: Alert and oriented. No focal neurological deficits. Extremities: No pedal edema. Skin: No rashes, lesions or ulcers   Data  Reviewed:    Labs: Basic Metabolic Panel: Recent Labs  Lab 07/03/17 2013 07/05/17 0522 07/06/17 0513  NA 133*  --   --   K 4.2  --   --   CL 97*  --   --   CO2 22  --   --   GLUCOSE 321*  --   --   BUN 18  --   --   CREATININE 0.81 0.86 0.84  CALCIUM 9.1  --   --    GFR Estimated Creatinine Clearance: 60.6 mL/min (by C-G formula based on SCr of 0.84 mg/dL). Liver Function Tests: Recent Labs  Lab 07/03/17 2013  AST 17  ALT 14  ALKPHOS 101  BILITOT 1.6*  PROT 8.0  ALBUMIN 3.8   No results for input(s): LIPASE, AMYLASE in the last 168 hours. No results for input(s): AMMONIA in the last 168 hours. Coagulation profile No results for input(s): INR, PROTIME in the  last 168 hours.  CBC: Recent Labs  Lab 07/03/17 2013 07/04/17 0511  WBC 15.9* 11.9*  NEUTROABS 12.7* 8.6*  HGB 13.5 11.3*  HCT 38.7 34.0*  MCV 85.2 86.3  PLT 255 211   Cardiac Enzymes: No results for input(s): CKTOTAL, CKMB, CKMBINDEX, TROPONINI in the last 168 hours. BNP (last 3 results) No results for input(s): PROBNP in the last 8760 hours. CBG: Recent Labs  Lab 07/05/17 0724 07/05/17 1141 07/05/17 1642 07/05/17 2143 07/06/17 0743  GLUCAP 132* 168* 153* 173* 178*   D-Dimer: No results for input(s): DDIMER in the last 72 hours. Hgb A1c: No results for input(s): HGBA1C in the last 72 hours. Lipid Profile: No results for input(s): CHOL, HDL, LDLCALC, TRIG, CHOLHDL, LDLDIRECT in the last 72 hours. Thyroid function studies: No results for input(s): TSH, T4TOTAL, T3FREE, THYROIDAB in the last 72 hours.  Invalid input(s): FREET3 Anemia work up: No results for input(s): VITAMINB12, FOLATE, FERRITIN, TIBC, IRON, RETICCTPCT in the last 72 hours. Sepsis Labs: Recent Labs  Lab 07/03/17 2013 07/03/17 2020 07/04/17 0511  PROCALCITON  --   --  0.16  WBC 15.9*  --  11.9*  LATICACIDVEN  --  1.24 0.9   Microbiology Recent Results (from the past 240 hour(s))  Urine Culture     Status: None   Collection Time: 07/03/17  4:02 PM  Result Value Ref Range Status   MICRO NUMBER: [id number]  Final   SPECIMEN QUALITY: ADEQUATE  Final   Sample Source URINE  Final   STATUS: FINAL  Final   ISOLATE 1:   Final    Multiple organisms present, each less than 10,000 CFU/mL. These organisms, commonly found on external and internal genitalia, are considered to be colonizers. No further testing performed.  Blood Culture (routine x 2)     Status: None (Preliminary result)   Collection Time: 07/03/17  8:33 PM  Result Value Ref Range Status   Specimen Description   Final    BLOOD LEFT WRIST Performed at [facility] [address] [place], [place] [address]    Special  Requests   Final    BOTTLES DRAWN AEROBIC AND ANAEROBIC Blood Culture adequate volume Performed at [facility] [address], [place], [place] [address]    Culture   Final    NO GROWTH 2 DAYS Performed at [facility] [address] [place], [place] [address]    Report Status PENDING  Incomplete  Blood Culture (routine x 2)     Status: None (  Preliminary result)   Collection Time: 07/03/17  8:40 PM  Result Value Ref Range Status   Specimen Description   Final    BLOOD RIGHT WRIST Performed at [facility] [address], [place], [place] [address]    Special Requests   Final    BOTTLES DRAWN AEROBIC ONLY Blood Culture adequate volume Performed at [facility] [address] [place], [place] [address]    Culture   Final    NO GROWTH 2 DAYS Performed at [facility] [address], [place], [place] [address]    Report Status PENDING  Incomplete  Aerobic Culture (superficial specimen)     Status: None (Preliminary result)   Collection Time: 07/03/17  9:45 PM  Result Value Ref Range Status   Specimen Description   Final    ABSCESS RECTUM Performed at [facility] [address] [place], [place] [address]    Special Requests   Final    NONE Performed at [facility], [place] [address], [place], [place] [address]    Gram Stain   Final    FEW WBC PRESENT, PREDOMINANTLY PMN MODERATE GRAM POSITIVE COCCI RARE GRAM NEGATIVE RODS    Culture   Final    MODERATE GROUP B STREP(S.AGALACTIAE)ISOLATED TESTING AGAINST S. AGALACTIAE NOT ROUTINELY PERFORMED DUE TO PREDICTABILITY OF AMP/PEN/VAN SUSCEPTIBILITY. Performed at [facility], [place] [address] [place], [place] [address]    Report Status PENDING  Incomplete     Medications:   . chlorhexidine  15 mL Mouth Rinse BID  . cloNIDine  0.2 mg Oral BID  . enoxaparin (LOVENOX) injection  40 mg Subcutaneous QHS  .  insulin aspart  0-15 Units Subcutaneous TID WC  . insulin aspart  0-5 Units Subcutaneous QHS  . insulin aspart  3 Units Subcutaneous TID WC  . insulin glargine  10 Units Subcutaneous BID  . lisinopril  10 mg Oral Daily  . mouth rinse  15 mL Mouth Rinse q12n4p  . rosuvastatin  40 mg Oral Daily   Continuous Infusions: . piperacillin-tazobactam (ZOSYN)  IV Stopped (07/06/17 1009)  . vancomycin Stopped (07/05/17 1923)     LOS: 3 days   Charlynne Cousins  [facility] Pager [phone]  *Please refer to [email or web address], password [email or web address] to get updated schedule on who will round on this patient, as hospitalists switch teams weekly. If 7PM-7AM, please contact night-coverage at [email or web address], password [email or web address] for any overnight needs.  07/06/2017, 10:40 AM

## 2017-07-06 NOTE — Progress Notes (Signed)
Daughter observed dressing change this shift, and was educated on correct way to pack the abscess wound. Daughter will be primary person caring for wound at home and stated she "can handle it."

## 2017-07-07 DIAGNOSIS — L0291 Cutaneous abscess, unspecified: Secondary | ICD-10-CM

## 2017-07-07 LAB — GLUCOSE, CAPILLARY: Glucose-Capillary: 189 mg/dL — ABNORMAL HIGH (ref 65–99)

## 2017-07-07 LAB — CYTOLOGY - PAP: Diagnosis: NEGATIVE

## 2017-07-07 LAB — CREATININE, SERUM: Creatinine, Ser: 0.63 mg/dL (ref 0.44–1.00)

## 2017-07-07 MED ORDER — AMOXICILLIN-POT CLAVULANATE 875-125 MG PO TABS: 1.0000 | ORAL_TABLET | Freq: Two times a day (BID) | ORAL | 0 refills | Status: AC

## 2017-07-07 MED ORDER — INSULIN GLARGINE 100 UNIT/ML SOLOSTAR PEN: 10.0000 [IU] | PEN_INJECTOR | Freq: Every day | SUBCUTANEOUS | 11 refills | Status: DC

## 2017-07-07 MED ORDER — INSULIN PEN NEEDLE 31G X 6 MM MISC: 1.0000 | Freq: Two times a day (BID) | 3 refills | Status: DC

## 2017-07-07 NOTE — Discharge Instructions (Signed)
Allison Patel was admitted to the Hospital on 07/03/2017 and Discharged on Discharge Date 07/07/2017 and should be excused from work/school   for 5  days starting 07/03/2017 , may return to work/school without any restrictions.  Call Bess Harvest MD, [facility] [phone] with questions.  Charlynne Cousins M.D on 07/07/2017,at 9:04 AM  [facility] Office  [phone]

## 2017-07-07 NOTE — Progress Notes (Signed)
Patient informed of co pay-$84.47-she voiced understanding.This was cheaper than good.rx.No further CM needs.

## 2017-07-07 NOTE — Discharge Summary (Addendum)
Physician Discharge Summary  Allison Patel QVZ:[id number] DOB: 07/23/1955 DOA: 07/03/2017  PCP: Elby Showers, MD  Admit date: 07/03/2017 Discharge date: 07/07/2017  Admitted From: home Disposition:  Home  Recommendations for Outpatient Follow-up:  1. Follow up with PCP in 1 weeks 2. Please obtain BMP/CBC in one week   Home Health:No Equipment/Devices:none  Discharge Condition:stable CODE STATUS:full iet recommendation: Heart Healthy  Brief/Interim Summary: 62 y.o. female past medical history of diabetes mellitus type 2, went and saw and was found febrile he relates he is been having fevers for the last 3 days with a drainage from his left buttock, during this time his sugar was also found to be elevated with ketones in urine, she has not been able to take her insulin for the last 3 weeks as it is expensive    Discharge Diagnoses:  Principal Problem:   Sepsis ([place]) Active Problems:   Benign essential HTN   Hypertrophic obstructive cardiomyopathy ([place])   Diabetes mellitus type 2, uncontrolled ([place])   Abscess   Hyponatremia  Sepsis ([place]) due to left buttock abscess: Status post incision and drainage on admission in the ED.  CT scan of the abdomen and pelvis did not show deep or perirectal abscess. Culture data data continue to be negative. In the hospital she was placed on IV Unasyn she will go home on oral Augmentin for 10 additional days. Wound culture was consulted who recommended daily packing and daily dressing changes.  Uncontrolled diabetes mellitus: Her Hbg A1c 14 she relates she has been taking her oral medications at home, she cannot afford her NovoLog due to the co-pay, she has to be changed to Lantus. Is start her on Lantus 10 and she is to continue her oral regimen and follow-up with PCP and titrate as needed.  Hypovolemia hyponatremia: Resolved with IV hydration.  Uncontrolled essential hypertension: Continue current home medications blood pressure  seems to be stable.    Discharge Instructions  Discharge Instructions    Diet - low sodium heart healthy   Complete by:  As directed    Increase activity slowly   Complete by:  As directed      Allergies as of 07/07/2017      Reactions   Macrobid [nitrofurantoin Macrocrystal] Rash      Medication List    TAKE these medications   amoxicillin-clavulanate 875-125 MG tablet Commonly known as:  AUGMENTIN Take 1 tablet by mouth every 12 (twelve) hours for 10 days.   cloNIDine 0.2 MG tablet Commonly known as:  CATAPRES Take 1 tablet (0.2 mg total) by mouth 2 (two) times daily.   Insulin Glargine 100 UNIT/ML Solostar Pen Commonly known as:  LANTUS Inject 10 Units into the skin daily at 10 pm.   Insulin Pen Needle 31G X 6 MM Misc 1 Device by Does not apply route 2 (two) times daily.   INVOKANA 300 MG Tabs tablet Generic drug:  canagliflozin Take 300 mg by mouth every morning.   lisinopril 10 MG tablet Commonly known as:  PRINIVIL,ZESTRIL Take 1 tablet (10 mg total) by mouth daily.   rosuvastatin 40 MG tablet Commonly known as:  CRESTOR Take 40 mg by mouth daily.   SitaGLIPtin-MetFORMIN HCl 50-1000 MG Tb24 Take 1 tablet by mouth daily.       Allergies  Allergen Reactions  . Macrobid [Nitrofurantoin Macrocrystal] Rash    Consultations:  None   Procedures/Studies: Ct Pelvis W Contrast  Result Date: 07/04/2017 CLINICAL DATA:  Recent drainage of perineal abscess.  EXAM: CT PELVIS WITH CONTRAST TECHNIQUE: Multidetector CT imaging of the pelvis was performed using the standard protocol following the bolus administration of intravenous contrast. CONTRAST:  189mL ISOVUE-300 IOPAMIDOL (ISOVUE-300) INJECTION 61% COMPARISON:  None. FINDINGS: Urinary Tract: No distal ureteral dilatation or calculi. Unremarkable bladder. Bowel:  Unremarkable visualized pelvic bowel loops. Vascular/Lymphatic: No significant vascular findings are evident. No enlarged lymph nodes are seen.  Reproductive:  Status post hysterectomy.  No adnexal mass. Other: No intraperitoneal free fluid. There is moderate diffuse inflammatory stranding in the subcutaneous fat throughout the inferior buttock region just right of midline extending to the perianal region. There are a couple locules of gas consistent with history of recent incision and drainage. No residual fluid collection is present. Musculoskeletal: Old, healed left superior and inferior pubic rami fractures. IMPRESSION: Subcutaneous inflammation in the inferior right buttock/posterior perineal region compatible with cellulitis. No residual drainable abscess. Electronically Signed   By: Logan Bores M.D.   On: 07/04/2017 08:43   Dg Chest Port 1 View  Result Date: 07/03/2017 CLINICAL DATA:  62 year old female with sepsis. EXAM: PORTABLE CHEST 1 VIEW COMPARISON:  None. FINDINGS: The lungs are clear. There is no pleural effusion or pneumothorax. Mild cardiomegaly. No acute osseous pathology. IMPRESSION: No active disease. Electronically Signed   By: Anner Crete M.D.   On: 07/03/2017 21:09       Subjective: No complaints feels great.  Discharge Exam: Vitals:   07/06/17 2129 07/07/17 0405  BP: (!) 170/81 (!) 143/72  Pulse: 85 81  Resp: 20 20  Temp: 98.7 F (37.1 C) 98.3 F (36.8 C)  SpO2: 98% 96%   Vitals:   07/06/17 0402 07/06/17 1359 07/06/17 2129 07/07/17 0405  BP: 138/71 124/62 (!) 170/81 (!) 143/72  Pulse: 79 81 85 81  Resp: 18 16 20 20   Temp: 99.6 F (37.6 C) 98 F (36.7 C) 98.7 F (37.1 C) 98.3 F (36.8 C)  TempSrc: Oral Oral Oral Oral  SpO2: 94% 97% 98% 96%  Weight:      Height:        General: Pt is alert, awake, not in acute distress Cardiovascular: RRR, S1/S2 +, no rubs, no gallops Respiratory: CTA bilaterally, no wheezing, no rhonchi Abdominal: Soft, NT, ND, bowel sounds + Extremities: no edema, no cyanosis    The results of significant diagnostics from this hospitalization (including imaging,  microbiology, ancillary and laboratory) are listed below for reference.     Microbiology: Recent Results (from the past 240 hour(s))  Urine Culture     Status: None   Collection Time: 07/03/17  4:02 PM  Result Value Ref Range Status   MICRO NUMBER: [id number]  Final   SPECIMEN QUALITY: ADEQUATE  Final   Sample Source URINE  Final   STATUS: FINAL  Final   ISOLATE 1:   Final    Multiple organisms present, each less than 10,000 CFU/mL. These organisms, commonly found on external and internal genitalia, are considered to be colonizers. No further testing performed.  Blood Culture (routine x 2)     Status: None (Preliminary result)   Collection Time: 07/03/17  8:33 PM  Result Value Ref Range Status   Specimen Description   Final    BLOOD LEFT WRIST Performed at [facility] [address] [place], [place] [address]    Special Requests   Final    BOTTLES DRAWN AEROBIC AND ANAEROBIC Blood Culture adequate volume Performed at [facility] [address] [place], [place]  [address]    Culture   Final    NO GROWTH 3 DAYS Performed at [facility], [place] [address], [place], [place] [address]    Report Status PENDING  Incomplete  Blood Culture (routine x 2)     Status: None (Preliminary result)   Collection Time: 07/03/17  8:40 PM  Result Value Ref Range Status   Specimen Description   Final    BLOOD RIGHT WRIST Performed at [facility] [address] [place], [place] [address]    Special Requests   Final    BOTTLES DRAWN AEROBIC ONLY Blood Culture adequate volume Performed at [facility] [address], [place], [place] [address]    Culture   Final    NO GROWTH 3 DAYS Performed at [facility], [place] [address], [place], [place] [address]    Report Status PENDING  Incomplete  Aerobic Culture (superficial specimen)     Status: None   Collection Time: 07/03/17  9:45 PM  Result Value Ref  Range Status   Specimen Description   Final    ABSCESS RECTUM Performed at [facility] [address], [place], [place] [address]    Special Requests   Final    NONE Performed at [facility], [place] [address] [place], [place] [address]    Gram Stain   Final    FEW WBC PRESENT, PREDOMINANTLY PMN MODERATE GRAM POSITIVE COCCI RARE GRAM NEGATIVE RODS    Culture   Final    MODERATE GROUP B STREP(S.AGALACTIAE)ISOLATED TESTING AGAINST S. AGALACTIAE NOT ROUTINELY PERFORMED DUE TO PREDICTABILITY OF AMP/PEN/VAN SUSCEPTIBILITY. Performed at [facility], [place] [address], [place], [place] [address]    Report Status 07/06/2017 FINAL  Final     Labs: BNP (last 3 results) No results for input(s): BNP in the last 8760 hours. Basic Metabolic Panel: Recent Labs  Lab 07/03/17 2013 07/05/17 0522 07/06/17 0513 07/07/17 0513  NA 133*  --   --   --   K 4.2  --   --   --   CL 97*  --   --   --   CO2 22  --   --   --   GLUCOSE 321*  --   --   --   BUN 18  --   --   --   CREATININE 0.81 0.86 0.84 0.63  CALCIUM 9.1  --   --   --    Liver Function Tests: Recent Labs  Lab 07/03/17 2013  AST 17  ALT 14  ALKPHOS 101  BILITOT 1.6*  PROT 8.0  ALBUMIN 3.8   No results for input(s): LIPASE, AMYLASE in the last 168 hours. No results for input(s): AMMONIA in the last 168 hours. CBC: Recent Labs  Lab 07/03/17 2013 07/04/17 0511  WBC 15.9* 11.9*  NEUTROABS 12.7* 8.6*  HGB 13.5 11.3*  HCT 38.7 34.0*  MCV 85.2 86.3  PLT 255 211   Cardiac Enzymes: No results for input(s): CKTOTAL, CKMB, CKMBINDEX, TROPONINI in the last 168 hours. BNP: Invalid input(s): POCBNP CBG: Recent Labs  Lab 07/06/17 0743 07/06/17 1124 07/06/17 1639 07/06/17 2136 07/07/17 0749  GLUCAP 178* 205* 158* 194* 189*   D-Dimer No results for input(s): DDIMER in the last 72 hours. Hgb A1c No results for input(s): HGBA1C in the last 72 hours. Lipid Profile No results  for input(s): CHOL, HDL, LDLCALC, TRIG, CHOLHDL, LDLDIRECT in the last 72  hours. Thyroid function studies No results for input(s): TSH, T4TOTAL, T3FREE, THYROIDAB in the last 72 hours.  Invalid input(s): FREET3 Anemia work up No results for input(s): VITAMINB12, FOLATE, FERRITIN, TIBC, IRON, RETICCTPCT in the last 72 hours. Urinalysis    Component Value Date/Time   COLORURINE YELLOW 07/03/2017 2058   APPEARANCEUR HAZY (A) 07/03/2017 2058   LABSPEC 1.030 07/03/2017 2058   PHURINE 5.0 07/03/2017 2058   GLUCOSEU >=500 (A) 07/03/2017 2058   HGBUR MODERATE (A) 07/03/2017 2058   BILIRUBINUR NEGATIVE 07/03/2017 2058   BILIRUBINUR NEG 07/03/2017 1527   KETONESUR 20 (A) 07/03/2017 2058   PROTEINUR NEGATIVE 07/03/2017 2058   UROBILINOGEN 0.2 07/03/2017 1527   NITRITE NEGATIVE 07/03/2017 2058   LEUKOCYTESUR LARGE (A) 07/03/2017 2058   Sepsis Labs Invalid input(s): PROCALCITONIN,  WBC,  LACTICIDVEN Microbiology Recent Results (from the past 240 hour(s))  Urine Culture     Status: None   Collection Time: 07/03/17  4:02 PM  Result Value Ref Range Status   MICRO NUMBER: [id number]  Final   SPECIMEN QUALITY: ADEQUATE  Final   Sample Source URINE  Final   STATUS: FINAL  Final   ISOLATE 1:   Final    Multiple organisms present, each less than 10,000 CFU/mL. These organisms, commonly found on external and internal genitalia, are considered to be colonizers. No further testing performed.  Blood Culture (routine x 2)     Status: None (Preliminary result)   Collection Time: 07/03/17  8:33 PM  Result Value Ref Range Status   Specimen Description   Final    BLOOD LEFT WRIST Performed at [facility] [address] [place], [place] [address]    Special Requests   Final    BOTTLES DRAWN AEROBIC AND ANAEROBIC Blood Culture adequate volume Performed at [facility] [address], [place], [place] [address]    Culture   Final    NO GROWTH 3 DAYS Performed  at [facility], [place] [address] [place], [place] [address]    Report Status PENDING  Incomplete  Blood Culture (routine x 2)     Status: None (Preliminary result)   Collection Time: 07/03/17  8:40 PM  Result Value Ref Range Status   Specimen Description   Final    BLOOD RIGHT WRIST Performed at [facility] [address], [place], [place] [address]    Special Requests   Final    BOTTLES DRAWN AEROBIC ONLY Blood Culture adequate volume Performed at [facility] [address], [place], [place] [address]    Culture   Final    NO GROWTH 3 DAYS Performed at [facility], [place] [address] [place],  [address]    Report Status PENDING  Incomplete  Aerobic Culture (superficial specimen)     Status: None   Collection Time: 07/03/17  9:45 PM  Result Value Ref Range Status   Specimen Description   Final    ABSCESS RECTUM Performed at [facility] [address] [place],  [address]    Special Requests   Final    NONE Performed at [facility], [place] [address], [place], [place] [address]    Gram Stain   Final    FEW WBC PRESENT, PREDOMINANTLY PMN MODERATE GRAM POSITIVE COCCI RARE GRAM NEGATIVE RODS    Culture   Final    MODERATE GROUP B STREP(S.AGALACTIAE)ISOLATED TESTING AGAINST S. AGALACTIAE NOT ROUTINELY PERFORMED DUE TO  PREDICTABILITY OF AMP/PEN/VAN SUSCEPTIBILITY. Performed at [facility], [place] [address] [place], [place] [address]    Report Status 07/06/2017 FINAL  Final     Time coordinating discharge: Over 30 minutes  SIGNED:   Charlynne Cousins, MD  [facility] 07/07/2017, 8:50 AM Pager   If 7PM-7AM, please contact night-coverage [email or web address] Password [email or web address]

## 2017-07-07 NOTE — Progress Notes (Signed)
Went over discharge papers with patient and husband.  Explained importance of taking meds as prescribed.  AVS and prescriptions given.  VSS.  Site of IV infiltration red but pt states it is improved from yesterday and not as sore.  Encouraged pt to ice and elevate and to call MD if becomes more red or painful.  Wheeled out via NT.

## 2017-07-07 NOTE — Care Management Note (Signed)
Case Management Note  Patient Details  Name: Allison Patel MRN: [id number] Date of Birth: Nov 08, 1955  Subjective/Objective:  No CM needs.                  Action/Plan:d/c home.   Expected Discharge Date:  07/07/17               Expected Discharge Plan:  Home/Self Care  In-House Referral:     Discharge planning Services  CM Consult  Post Acute Care Choice:    Choice offered to:     DME Arranged:    DME Agency:     HH Arranged:    HH Agency:     Status of Service:  Completed, signed off  If discussed at [employer] of Stay Meetings, dates discussed:    Additional Comments:  Dessa Phi, RN 07/07/2017, 9:35 AM

## 2017-07-08 LAB — CULTURE, BLOOD (ROUTINE X 2)
CULTURE: NO GROWTH
CULTURE: NO GROWTH
SPECIAL REQUESTS: ADEQUATE
Special Requests: ADEQUATE

## 2017-07-18 ENCOUNTER — Ambulatory Visit (INDEPENDENT_AMBULATORY_CARE_PROVIDER_SITE_OTHER): Payer: No Typology Code available for payment source | Admitting: Internal Medicine

## 2017-07-18 ENCOUNTER — Encounter: Payer: Self-pay | Admitting: Internal Medicine

## 2017-07-18 VITALS — BP 162/90 | HR 78 | Temp 98.1°F | Ht 59.5 in | Wt 153.0 lb

## 2017-07-18 DIAGNOSIS — E119 Type 2 diabetes mellitus without complications: Secondary | ICD-10-CM

## 2017-07-18 DIAGNOSIS — L02215 Cutaneous abscess of perineum: Secondary | ICD-10-CM | POA: Diagnosis not present

## 2017-07-18 DIAGNOSIS — I10 Essential (primary) hypertension: Secondary | ICD-10-CM

## 2017-07-18 DIAGNOSIS — B3789 Other sites of candidiasis: Secondary | ICD-10-CM

## 2017-07-18 MED ORDER — KETOCONAZOLE 2 % EX CREA: 1.0000 "application " | TOPICAL_CREAM | Freq: Every day | CUTANEOUS | 2 refills | Status: DC

## 2017-07-18 MED ORDER — AMLODIPINE BESYLATE 5 MG PO TABS: 5.0000 mg | ORAL_TABLET | Freq: Every day | ORAL | 3 refills | Status: DC

## 2017-07-18 NOTE — Progress Notes (Signed)
   Subjective:    Patient ID: Allison Patel, female    DOB: 02/04/1956, 62 y.o.   MRN: [id number]  HPI  62 year old Female in today for hospitalization follow-up.  She presented to the office as a new patient on March 14 and was found to have a perineal abscess, temperature of 101.5 degrees, and glucose of 441.  She was sent to [facility] and was admitted.  Perineal abscess right perineum was drained in the emergency department.  During that time she was hospitalized she was unable to attend her brother's funeral.  Culture of the perineal abscess was negative.  She was treated with IV Unasyn and was sent home on Augmentin for 10 days which she has almost completed.  She was having issues when I saw her with affording her FlexPen and she is now on Lantus and is able to afford that.  She brings in multiple Accu-Cheks over the past several days since discharge.  Accu-Cheks have ranged from 130-237 which is a market improvement.  Mostly have been in the high 100s but occasionally in the low 200s.  She looks much improved.  Her blood pressure is elevated today at 160/90.  I am adding amlodipine 5 mg daily to her regimen.    Review of Systems see above-itching and perineal area     Objective:   Physical Exam Skin warm and dry.  There is erythema in her perineal area bilaterally consistent with Candida infection.  Abscess appears to be healed.  Neck is supple.  Chest clear.  Cardiac exam 2/6 systolic ejection murmur which patient says is long-standing.  Extremities without edema       Assessment & Plan:  Insulin-dependent diabetes mellitus-poorly controlled diabetes in the face of infection  Status post drainage of perineal abscess   Essential hypertension-add amlodipine to her regimen 5 mg daily  Candida infection of perineum  Plan: She will see Dr. Buddy Duty.  I will see her again in 3 weeks.  For diabetic management April 2 Nizoral cream to perineal area

## 2017-07-18 NOTE — Patient Instructions (Signed)
Nizoral cream to rash in groin area bilaterally daily until healed.  See Dr. Buddy Duty April 2 for diabetic management.  Add amlodipine 5 mg daily to a pressure regimen and follow-up here in 3 weeks.

## 2017-07-22 ENCOUNTER — Other Ambulatory Visit: Payer: Self-pay | Admitting: Internal Medicine

## 2017-08-14 ENCOUNTER — Ambulatory Visit (INDEPENDENT_AMBULATORY_CARE_PROVIDER_SITE_OTHER): Payer: No Typology Code available for payment source | Admitting: Internal Medicine

## 2017-08-14 ENCOUNTER — Encounter: Payer: Self-pay | Admitting: Internal Medicine

## 2017-08-14 VITALS — BP 150/92 | HR 94 | Ht 59.5 in | Wt 155.0 lb

## 2017-08-14 DIAGNOSIS — L02215 Cutaneous abscess of perineum: Secondary | ICD-10-CM | POA: Diagnosis not present

## 2017-08-14 DIAGNOSIS — I10 Essential (primary) hypertension: Secondary | ICD-10-CM | POA: Diagnosis not present

## 2017-08-14 DIAGNOSIS — E119 Type 2 diabetes mellitus without complications: Secondary | ICD-10-CM

## 2017-08-14 MED ORDER — LISINOPRIL-HYDROCHLOROTHIAZIDE 20-25 MG PO TABS: 1.0000 | ORAL_TABLET | Freq: Every day | ORAL | 1 refills | Status: DC

## 2017-08-14 MED ORDER — AMLODIPINE BESYLATE 5 MG PO TABS: 5.0000 mg | ORAL_TABLET | Freq: Every day | ORAL | 3 refills | Status: DC

## 2017-08-14 NOTE — Patient Instructions (Signed)
Continue amlodipine 5 mg daily.  Discontinue lisinopril.  Prescribed lisinopril HCTZ 20/25 daily.  Continue clonidine.  Follow-up in 3 weeks.

## 2017-08-14 NOTE — Progress Notes (Signed)
   Subjective:    Patient ID: Allison Patel, female    DOB: 25-Jul-1955, 62 y.o.   MRN: [id number]  HPI 62 year old Female here today to follow-up on hypertension.  She saw Dr. Buddy Duty  recently regarding diabetes.  She says she was changed to NovoLog 4 units with each meal.  Lantus dose is still the same.  Says Accu-Cheks have been running in the 130s and 140s which is excellent for her.  She saw Dr. Leighton Ruff regarding perineal abscess and Dr. Marcello Moores felt that her abscess had resolved and no further surgery was needed.  She is been on lisinopril 10 mg daily for a long time.  She is also on clonidine 0.2 mg twice daily.  At last visit I added amlodipine 5 mg daily.  However, blood pressure remains elevated   Review of Systems     Objective:   Physical Exam Neck supple.  Chest clear period cardiac exam regular rate and rhythm 2/6 systolic ejection murmur which is been present for some time . Extremities without edema.  At this point I think we can increase lisinopril to 20 mg daily and add HCTZ 25 mg daily and a combination pill and she will follow-up in 3 weeks.  She will continue amlodipine 5 mg daily and continue clonidine.  Blood pressure is 150/92 and  was rechecked.  I do not see that much difference after adding amlodipine.  On March 14 blood pressure was 160/90.  On March 29 blood pressure was 162/90.     Assessment & Plan:  Essential hypertension- still not under ideal control.  Add Prinivil HCTZ 20/25 daily and follow-up in 3 weeks.  Continue amlodipine 5 mg daily.  Continue clonidine.  Will need basic metabolic panel upon return.  Insulin-dependent diabetes mellitus  Metabolic syndrome  BMI 21.19  History of perineal abscess-resolved  Plan: See above and follow-up in 3 weeks

## 2017-09-05 ENCOUNTER — Other Ambulatory Visit: Payer: Self-pay | Admitting: Internal Medicine

## 2017-09-05 ENCOUNTER — Ambulatory Visit (INDEPENDENT_AMBULATORY_CARE_PROVIDER_SITE_OTHER): Payer: No Typology Code available for payment source | Admitting: Internal Medicine

## 2017-09-05 ENCOUNTER — Encounter: Payer: Self-pay | Admitting: Internal Medicine

## 2017-09-05 VITALS — BP 148/92 | HR 84 | Temp 98.2°F | Ht 59.5 in | Wt 153.0 lb

## 2017-09-05 DIAGNOSIS — I10 Essential (primary) hypertension: Secondary | ICD-10-CM | POA: Diagnosis not present

## 2017-09-05 LAB — BASIC METABOLIC PANEL
BUN: 23 mg/dL (ref 7–25)
CALCIUM: 9.7 mg/dL (ref 8.6–10.4)
CHLORIDE: 95 mmol/L — AB (ref 98–110)
CO2: 29 mmol/L (ref 20–32)
Creat: 0.84 mg/dL (ref 0.50–0.99)
Glucose, Bld: 264 mg/dL — ABNORMAL HIGH (ref 65–99)
POTASSIUM: 5.5 mmol/L — AB (ref 3.5–5.3)
Sodium: 134 mmol/L — ABNORMAL LOW (ref 135–146)

## 2017-09-05 NOTE — Progress Notes (Signed)
   Subjective:    Patient ID: Allison Patel, female    DOB: [birth year], 62 y.o.   MRN: [id number]  HPI Here for follow up of HTN.   BP was 146/74 in Endocrine office recently. Feels well with no new complaints.  Review of Systems see above says Dr. Buddy Duty pleased with diabetic control.     Objective:   Physical Exam No JVD thyromegaly or bruits. Chest clear. Cor:RRR  BP 148/92.       Assessment & Plan:  HTN- at last visit pt was started on Norvasc 5 mg daily. Previously was was on Zestril 10 mg daily.Changed to Zestoretic 20/25 April 25. Also takes Catapres 0.2 mg twice daily po. Needs to keep diary of BP readings. Follow up June 17. May need Benicar if BP still elevated.

## 2017-09-15 ENCOUNTER — Encounter: Payer: Self-pay | Admitting: Internal Medicine

## 2017-09-15 NOTE — Patient Instructions (Signed)
Continue same BP  meds and follow up in June. Keep readings and bring to you at next appt.

## 2017-09-17 ENCOUNTER — Other Ambulatory Visit: Payer: Self-pay | Admitting: Internal Medicine

## 2017-10-06 ENCOUNTER — Ambulatory Visit: Payer: No Typology Code available for payment source | Admitting: Internal Medicine

## 2017-10-17 ENCOUNTER — Ambulatory Visit (INDEPENDENT_AMBULATORY_CARE_PROVIDER_SITE_OTHER): Payer: No Typology Code available for payment source | Admitting: Internal Medicine

## 2017-10-17 ENCOUNTER — Encounter: Payer: Self-pay | Admitting: Internal Medicine

## 2017-10-17 VITALS — BP 170/90 | HR 84 | Temp 97.9°F | Ht 59.5 in | Wt 164.0 lb

## 2017-10-17 DIAGNOSIS — Z1211 Encounter for screening for malignant neoplasm of colon: Secondary | ICD-10-CM | POA: Diagnosis not present

## 2017-10-17 DIAGNOSIS — F411 Generalized anxiety disorder: Secondary | ICD-10-CM | POA: Diagnosis not present

## 2017-10-17 DIAGNOSIS — I10 Essential (primary) hypertension: Secondary | ICD-10-CM

## 2017-10-17 MED ORDER — OLMESARTAN MEDOXOMIL-HCTZ 40-25 MG PO TABS: 1.0000 | ORAL_TABLET | Freq: Every day | ORAL | 1 refills | Status: DC

## 2017-10-17 MED ORDER — ALPRAZOLAM 0.5 MG PO TABS: 0.5000 mg | ORAL_TABLET | Freq: Two times a day (BID) | ORAL | 0 refills | Status: DC | PRN

## 2017-10-17 MED ORDER — ROSUVASTATIN CALCIUM 40 MG PO TABS: 40.0000 mg | ORAL_TABLET | Freq: Every day | ORAL | 3 refills | Status: DC

## 2017-10-17 NOTE — Progress Notes (Signed)
   Subjective:    Patient ID: Allison Patel, female    DOB: 1956-03-19, 62 y.o.   MRN: [id number]  HPI here today to follow-up on hypertension.  She brings in multiple blood pressure readings with systolic pressures in the 130s up to 156.  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  Diastolic readings have ranged from 70-86.  Most of the readings have been in the 539J systolically.  She is starting to work on her brother's estate.  At times she gets anxious and continues to have some grief over his passing from pancreatic cancer.  We discussed this and she will have a small quantity of Xanax to take sparingly.  She has to speak with endocrinologist because she recently found out that NovoLog would not be covered by her insurance.  This is been an issue for her.  Referral made for screening colonoscopy.  She is on lisinopril HCTZ for hypertension but we are going to change that to Benicar HCTZ 40/25 and follow-up in 4 weeks.  She will continue with amlodipine 5 mg daily.  She is also on Catapres 0.2 mg twice daily.   Review of Systems     Objective:   Physical Exam  Neck supple.  Chest clear.  Cardiac exam regular rate and rhythm.  Extremities without edema.      Assessment & Plan:  Essential hypertension-aiming for improved control  Diabetes mellitus-treated by Dr. Robina Ade  Anxiety state-patient will take Xanax 0.5 mg 1/2 to 1 tablet twice daily as needed for anxiety/insomnia.  Lisinopril HCTZ discontinued and patient will be on Benicar HCTZ 40/25 daily.  She will follow-up in 4 weeks

## 2017-10-17 NOTE — Patient Instructions (Signed)
Xanax 0.5 mg to take 1/2 to 1 tablet twice daily as needed for anxiety/insomnia.  Change lisinopril HCTZ to Benicar HCTZ 40/25 and follow-up in 4 weeks.

## 2017-10-22 ENCOUNTER — Other Ambulatory Visit: Payer: Self-pay | Admitting: Internal Medicine

## 2017-11-01 ENCOUNTER — Other Ambulatory Visit: Payer: Self-pay | Admitting: Internal Medicine

## 2017-11-14 ENCOUNTER — Telehealth: Payer: Self-pay | Admitting: Internal Medicine

## 2017-11-14 ENCOUNTER — Encounter: Payer: Self-pay | Admitting: Internal Medicine

## 2017-11-14 ENCOUNTER — Ambulatory Visit (INDEPENDENT_AMBULATORY_CARE_PROVIDER_SITE_OTHER): Payer: No Typology Code available for payment source | Admitting: Internal Medicine

## 2017-11-14 VITALS — BP 130/84 | HR 97 | Temp 97.3°F | Wt 158.0 lb

## 2017-11-14 DIAGNOSIS — F411 Generalized anxiety disorder: Secondary | ICD-10-CM | POA: Diagnosis not present

## 2017-11-14 DIAGNOSIS — I10 Essential (primary) hypertension: Secondary | ICD-10-CM

## 2017-11-14 LAB — BASIC METABOLIC PANEL
BUN / CREAT RATIO: 29 (calc) — AB (ref 6–22)
BUN: 28 mg/dL — AB (ref 7–25)
CALCIUM: 9.5 mg/dL (ref 8.6–10.4)
CO2: 26 mmol/L (ref 20–32)
Chloride: 93 mmol/L — ABNORMAL LOW (ref 98–110)
Creat: 0.97 mg/dL (ref 0.50–0.99)
GLUCOSE: 518 mg/dL — AB (ref 65–99)
Potassium: 4.9 mmol/L (ref 3.5–5.3)
SODIUM: 129 mmol/L — AB (ref 135–146)

## 2017-11-14 MED ORDER — OLMESARTAN MEDOXOMIL-HCTZ 40-25 MG PO TABS: 1.0000 | ORAL_TABLET | Freq: Every day | ORAL | 1 refills | Status: DC

## 2017-11-14 NOTE — Telephone Encounter (Signed)
Called by lab at apprx 8:45 pm. Had B-met this am and lab says glucose was slightly over 500. Called pt. Immediately. She checked accucheck while I waited on phone and said it was 272. Had sweet tea to drink this am before coming to office. Advised keep close check on glucose and follow up with Dr. Buddy Duty next week.

## 2017-11-14 NOTE — Progress Notes (Signed)
   Subjective:    Patient ID: Allison Patel, female    DOB: [birth year], 62 y.o.   MRN: [id number]  HPI 62 year old Female follow-up on hypertension.  Blood pressure is excellent today on current regimen.  Is now on Benicar HCTZ 40/25 and addition to amlodipine 5 mg daily and Catapres 0.2 mg twice daily.  Xanax is helping anxiety.  She is sleeping better.   Review of Systems see above     Objective:   Physical Exam  Chest clear.  Cardiac exam regular rate and rhythm 2/6 systolic ejection murmur upper right sternal border.  Extremities without edema.  Basic metabolic panel drawn on olmesartan HCTZ      Assessment & Plan:  Essential hypertension- basic metabolic panel drawn and pending on olmesartan/ HCTZ  Anxiety-Xanax refilled.  Sleeping better.  Needs to continue this indefinitely.  Diabetes mellitus-under the treatment of Dr. Buddy Duty  Plan: She will return in November for 21-month recheck.  Basic metabolic panel pending.

## 2017-11-14 NOTE — Patient Instructions (Signed)
It was a pleasure to see you today.  Continue same medications.  Basic metabolic panel drawn on olmesartan HCTZ.  Return in November for 42-month recheck.

## 2017-11-17 ENCOUNTER — Encounter: Payer: Self-pay | Admitting: Internal Medicine

## 2017-12-03 ENCOUNTER — Other Ambulatory Visit: Payer: Self-pay | Admitting: Internal Medicine

## 2017-12-16 ENCOUNTER — Other Ambulatory Visit: Payer: Self-pay | Admitting: Internal Medicine

## 2018-01-14 LAB — HM DIABETES EYE EXAM

## 2018-01-20 ENCOUNTER — Encounter: Payer: Self-pay | Admitting: Internal Medicine

## 2018-01-31 ENCOUNTER — Other Ambulatory Visit: Payer: Self-pay | Admitting: Internal Medicine

## 2018-03-04 ENCOUNTER — Other Ambulatory Visit: Payer: Self-pay | Admitting: Internal Medicine

## 2018-03-16 ENCOUNTER — Encounter: Payer: Self-pay | Admitting: Internal Medicine

## 2018-03-16 ENCOUNTER — Ambulatory Visit (INDEPENDENT_AMBULATORY_CARE_PROVIDER_SITE_OTHER): Payer: No Typology Code available for payment source | Admitting: Internal Medicine

## 2018-03-16 VITALS — BP 138/80 | HR 83 | Ht 59.5 in | Wt 161.0 lb

## 2018-03-16 DIAGNOSIS — E7800 Pure hypercholesterolemia, unspecified: Secondary | ICD-10-CM | POA: Diagnosis not present

## 2018-03-16 DIAGNOSIS — I10 Essential (primary) hypertension: Secondary | ICD-10-CM

## 2018-03-16 DIAGNOSIS — IMO0001 Reserved for inherently not codable concepts without codable children: Secondary | ICD-10-CM

## 2018-03-16 DIAGNOSIS — E119 Type 2 diabetes mellitus without complications: Secondary | ICD-10-CM | POA: Diagnosis not present

## 2018-03-16 DIAGNOSIS — Z794 Long term (current) use of insulin: Secondary | ICD-10-CM

## 2018-03-16 DIAGNOSIS — Z6831 Body mass index (BMI) 31.0-31.9, adult: Secondary | ICD-10-CM | POA: Diagnosis not present

## 2018-03-16 NOTE — Progress Notes (Signed)
   Subjective:    Patient ID: Allison Patel, female    DOB: 12/13/1955, 62 y.o.   MRN: [id number]  HPI 62 year old Female for 6 month recheck on HTN. BP stable on current regimen.Has insulin dependent diabtets  Hx poorly controlled DM with Hgb AIC  greater than14% in March. Invokanna was discontinued after perirectal abscess was drained requiring hospitalization in March. Now on Humalog. Last AIC was 13.5 % in  April done by Dr. Buddy Duty. She has appt to see him tomorrow.  Did have Theressa Stamps device but did not like it. Fasting glucose running about 180. Doesn't usually check it in the evenings.Discussion about this today.  Flu vaccine done through employment  Blood pressure today is good at 138/80.  BMP pending.  Urine for microalbumin pending.  Review of Systems  Respiratory: Negative.   Cardiovascular: Negative.   Gastrointestinal: Negative.   Genitourinary: Negative.   Neurological: Negative.         Objective:   Physical Exam  Constitutional: She is oriented to person, place, and time. She appears well-developed and well-nourished. No distress.  Cardiovascular: Normal rate, regular rhythm and normal heart sounds.  No murmur heard. Pulmonary/Chest: Effort normal and breath sounds normal. No stridor. No respiratory distress. She has no wheezes.  Musculoskeletal: She exhibits no edema.  Neurological: She is alert and oriented to person, place, and time.  Skin: Skin is warm and dry. She is not diaphoretic.  Psychiatric: She has a normal mood and affect. Her behavior is normal. Judgment and thought content normal.  Vitals reviewed.         Assessment & Plan:  History of poorly controlled diabetes mellitus but that previously was in the setting of a perirectal abscess and situational stress with her brother who passed away.  He will be managed and see what her hemoglobin A1c is tomorrow when she sees endocrinologist, Dr. Buddy Duty.  Encourage diet exercise and weight  loss  Essential hypertension-stable on current regimen  Urine microalbumin pending  Obesity-BMI 31.97.  Weight 161 pounds.  History of pure hypercholesterolemia-is on Crestor 40 mg daily  Plan: Patient will return in 6 months for physical examination.  She will see Dr. Buddy Duty tomorrow.

## 2018-03-16 NOTE — Patient Instructions (Signed)
Please see Dr. Buddy Duty tomorrow for follow-up on diabetes.  Continue current medications.  Please work on diet exercise and weight loss.

## 2018-03-17 LAB — BASIC METABOLIC PANEL
BUN: 24 mg/dL (ref 7–25)
CHLORIDE: 96 mmol/L — AB (ref 98–110)
CO2: 28 mmol/L (ref 20–32)
CREATININE: 0.85 mg/dL (ref 0.50–0.99)
Calcium: 10 mg/dL (ref 8.6–10.4)
Glucose, Bld: 216 mg/dL — ABNORMAL HIGH (ref 65–99)
Potassium: 4.3 mmol/L (ref 3.5–5.3)
Sodium: 136 mmol/L (ref 135–146)

## 2018-03-17 LAB — MICROALBUMIN / CREATININE URINE RATIO
CREATININE, URINE: 117 mg/dL (ref 20–275)
Microalb Creat Ratio: 43 mcg/mg creat — ABNORMAL HIGH (ref <30)
Microalb, Ur: 5 mg/dL

## 2018-03-19 ENCOUNTER — Other Ambulatory Visit: Payer: Self-pay | Admitting: Internal Medicine

## 2018-05-02 ENCOUNTER — Other Ambulatory Visit: Payer: Self-pay | Admitting: Internal Medicine

## 2018-06-10 ENCOUNTER — Other Ambulatory Visit: Payer: Self-pay | Admitting: Internal Medicine

## 2018-06-10 NOTE — Telephone Encounter (Signed)
Refill x 6 months 

## 2018-06-20 ENCOUNTER — Other Ambulatory Visit: Payer: Self-pay | Admitting: Internal Medicine

## 2018-07-31 ENCOUNTER — Other Ambulatory Visit: Payer: Self-pay | Admitting: Internal Medicine

## 2018-09-20 ENCOUNTER — Other Ambulatory Visit: Payer: Self-pay | Admitting: Internal Medicine

## 2018-10-06 ENCOUNTER — Other Ambulatory Visit: Payer: Self-pay

## 2018-10-06 ENCOUNTER — Emergency Department (HOSPITAL_COMMUNITY): Payer: No Typology Code available for payment source

## 2018-10-06 ENCOUNTER — Encounter (HOSPITAL_COMMUNITY): Payer: Self-pay

## 2018-10-06 ENCOUNTER — Inpatient Hospital Stay (HOSPITAL_COMMUNITY)
Admission: EM | Admit: 2018-10-06 | Discharge: 2018-10-23 | DRG: 871 | Disposition: A | Discharge status: Home Health | Payer: No Typology Code available for payment source | Attending: Internal Medicine | Admitting: Internal Medicine

## 2018-10-06 ENCOUNTER — Inpatient Hospital Stay (HOSPITAL_COMMUNITY): Payer: No Typology Code available for payment source

## 2018-10-06 DIAGNOSIS — A419 Sepsis, unspecified organism: Secondary | ICD-10-CM | POA: Diagnosis present

## 2018-10-06 DIAGNOSIS — Z8249 Family history of ischemic heart disease and other diseases of the circulatory system: Secondary | ICD-10-CM

## 2018-10-06 DIAGNOSIS — N10 Acute tubulo-interstitial nephritis: Secondary | ICD-10-CM | POA: Diagnosis present

## 2018-10-06 DIAGNOSIS — J9601 Acute respiratory failure with hypoxia: Secondary | ICD-10-CM | POA: Diagnosis not present

## 2018-10-06 DIAGNOSIS — I10 Essential (primary) hypertension: Secondary | ICD-10-CM | POA: Diagnosis present

## 2018-10-06 DIAGNOSIS — R011 Cardiac murmur, unspecified: Secondary | ICD-10-CM | POA: Diagnosis present

## 2018-10-06 DIAGNOSIS — Z800 Family history of malignant neoplasm of digestive organs: Secondary | ICD-10-CM

## 2018-10-06 DIAGNOSIS — F419 Anxiety disorder, unspecified: Secondary | ICD-10-CM | POA: Diagnosis present

## 2018-10-06 DIAGNOSIS — E11649 Type 2 diabetes mellitus with hypoglycemia without coma: Secondary | ICD-10-CM | POA: Diagnosis not present

## 2018-10-06 DIAGNOSIS — R34 Anuria and oliguria: Secondary | ICD-10-CM | POA: Diagnosis present

## 2018-10-06 DIAGNOSIS — D509 Iron deficiency anemia, unspecified: Secondary | ICD-10-CM | POA: Diagnosis present

## 2018-10-06 DIAGNOSIS — Z794 Long term (current) use of insulin: Secondary | ICD-10-CM

## 2018-10-06 DIAGNOSIS — R7989 Other specified abnormal findings of blood chemistry: Secondary | ICD-10-CM

## 2018-10-06 DIAGNOSIS — D696 Thrombocytopenia, unspecified: Secondary | ICD-10-CM | POA: Diagnosis not present

## 2018-10-06 DIAGNOSIS — E039 Hypothyroidism, unspecified: Secondary | ICD-10-CM | POA: Diagnosis present

## 2018-10-06 DIAGNOSIS — D638 Anemia in other chronic diseases classified elsewhere: Secondary | ICD-10-CM | POA: Diagnosis present

## 2018-10-06 DIAGNOSIS — E785 Hyperlipidemia, unspecified: Secondary | ICD-10-CM | POA: Diagnosis present

## 2018-10-06 DIAGNOSIS — N179 Acute kidney failure, unspecified: Secondary | ICD-10-CM | POA: Diagnosis not present

## 2018-10-06 DIAGNOSIS — E872 Acidosis: Secondary | ICD-10-CM | POA: Diagnosis present

## 2018-10-06 DIAGNOSIS — E781 Pure hyperglyceridemia: Secondary | ICD-10-CM | POA: Diagnosis present

## 2018-10-06 DIAGNOSIS — E871 Hypo-osmolality and hyponatremia: Secondary | ICD-10-CM | POA: Diagnosis not present

## 2018-10-06 DIAGNOSIS — R6520 Severe sepsis without septic shock: Secondary | ICD-10-CM | POA: Diagnosis present

## 2018-10-06 DIAGNOSIS — A4159 Other Gram-negative sepsis: Secondary | ICD-10-CM | POA: Diagnosis present

## 2018-10-06 DIAGNOSIS — G9341 Metabolic encephalopathy: Secondary | ICD-10-CM | POA: Diagnosis present

## 2018-10-06 DIAGNOSIS — E1121 Type 2 diabetes mellitus with diabetic nephropathy: Secondary | ICD-10-CM | POA: Diagnosis present

## 2018-10-06 DIAGNOSIS — I4510 Unspecified right bundle-branch block: Secondary | ICD-10-CM | POA: Diagnosis present

## 2018-10-06 DIAGNOSIS — E1165 Type 2 diabetes mellitus with hyperglycemia: Secondary | ICD-10-CM | POA: Diagnosis present

## 2018-10-06 DIAGNOSIS — F329 Major depressive disorder, single episode, unspecified: Secondary | ICD-10-CM | POA: Diagnosis not present

## 2018-10-06 DIAGNOSIS — Z881 Allergy status to other antibiotic agents status: Secondary | ICD-10-CM | POA: Diagnosis not present

## 2018-10-06 DIAGNOSIS — R6521 Severe sepsis with septic shock: Secondary | ICD-10-CM | POA: Diagnosis present

## 2018-10-06 DIAGNOSIS — E7800 Pure hypercholesterolemia, unspecified: Secondary | ICD-10-CM | POA: Diagnosis not present

## 2018-10-06 DIAGNOSIS — Z1159 Encounter for screening for other viral diseases: Secondary | ICD-10-CM | POA: Diagnosis not present

## 2018-10-06 DIAGNOSIS — I471 Supraventricular tachycardia: Secondary | ICD-10-CM | POA: Diagnosis present

## 2018-10-06 DIAGNOSIS — I422 Other hypertrophic cardiomyopathy: Secondary | ICD-10-CM | POA: Diagnosis present

## 2018-10-06 DIAGNOSIS — Z833 Family history of diabetes mellitus: Secondary | ICD-10-CM

## 2018-10-06 DIAGNOSIS — E875 Hyperkalemia: Secondary | ICD-10-CM | POA: Diagnosis not present

## 2018-10-06 DIAGNOSIS — R0682 Tachypnea, not elsewhere classified: Secondary | ICD-10-CM

## 2018-10-06 DIAGNOSIS — N170 Acute kidney failure with tubular necrosis: Secondary | ICD-10-CM | POA: Diagnosis present

## 2018-10-06 DIAGNOSIS — R7881 Bacteremia: Secondary | ICD-10-CM | POA: Diagnosis not present

## 2018-10-06 DIAGNOSIS — E8770 Fluid overload, unspecified: Secondary | ICD-10-CM | POA: Diagnosis not present

## 2018-10-06 DIAGNOSIS — IMO0002 Reserved for concepts with insufficient information to code with codable children: Secondary | ICD-10-CM | POA: Diagnosis present

## 2018-10-06 DIAGNOSIS — R945 Abnormal results of liver function studies: Secondary | ICD-10-CM

## 2018-10-06 DIAGNOSIS — Z6838 Body mass index (BMI) 38.0-38.9, adult: Secondary | ICD-10-CM

## 2018-10-06 DIAGNOSIS — E118 Type 2 diabetes mellitus with unspecified complications: Secondary | ICD-10-CM | POA: Diagnosis not present

## 2018-10-06 LAB — POCT I-STAT EG7
Acid-base deficit: 8 mmol/L — ABNORMAL HIGH (ref 0.0–2.0)
Bicarbonate: 17.4 mmol/L — ABNORMAL LOW (ref 20.0–28.0)
Calcium, Ion: 0.94 mmol/L — ABNORMAL LOW (ref 1.15–1.40)
HCT: 38 % (ref 36.0–46.0)
Hemoglobin: 12.9 g/dL (ref 12.0–15.0)
O2 Saturation: 65 %
Potassium: 4.1 mmol/L (ref 3.5–5.1)
Sodium: 132 mmol/L — ABNORMAL LOW (ref 135–145)
TCO2: 18 mmol/L — ABNORMAL LOW (ref 22–32)
pCO2, Ven: 33.4 mmHg — ABNORMAL LOW (ref 44.0–60.0)
pH, Ven: 7.323 (ref 7.250–7.430)
pO2, Ven: 36 mmHg (ref 32.0–45.0)

## 2018-10-06 LAB — LACTIC ACID, PLASMA
Lactic Acid, Venous: 4.2 mmol/L (ref 0.5–1.9)
Lactic Acid, Venous: 4 mmol/L (ref 0.5–1.9)

## 2018-10-06 LAB — CBC WITH DIFFERENTIAL/PLATELET
Abs Immature Granulocytes: 2.15 10*3/uL — ABNORMAL HIGH (ref 0.00–0.07)
Basophils Absolute: 0 10*3/uL (ref 0.0–0.1)
Basophils Relative: 0 %
Eosinophils Absolute: 0.4 10*3/uL (ref 0.0–0.5)
Eosinophils Relative: 4 %
HCT: 39.1 % (ref 36.0–46.0)
Hemoglobin: 13.3 g/dL (ref 12.0–15.0)
Immature Granulocytes: 21 %
Lymphocytes Relative: 2 %
Lymphs Abs: 0.2 10*3/uL — ABNORMAL LOW (ref 0.7–4.0)
MCH: 27.9 pg (ref 26.0–34.0)
MCHC: 34 g/dL (ref 30.0–36.0)
MCV: 82.1 fL (ref 80.0–100.0)
Monocytes Absolute: 0.3 10*3/uL (ref 0.1–1.0)
Monocytes Relative: 3 %
Neutro Abs: 7.2 10*3/uL (ref 1.7–7.7)
Neutrophils Relative %: 70 %
Platelets: 65 10*3/uL — ABNORMAL LOW (ref 150–400)
RBC: 4.76 MIL/uL (ref 3.87–5.11)
RDW: 14 % (ref 11.5–15.5)
WBC: 10.3 10*3/uL (ref 4.0–10.5)
nRBC: 0 % (ref 0.0–0.2)

## 2018-10-06 LAB — URINALYSIS, ROUTINE W REFLEX MICROSCOPIC
Bilirubin Urine: NEGATIVE
Glucose, UA: >=500 mg/dL — AB
Ketones, ur: NEGATIVE mg/dL
Nitrite: NEGATIVE
Protein, ur: >=300 mg/dL — AB
Specific Gravity, Urine: 1.015 (ref 1.005–1.030)
WBC, UA: >50 WBC/hpf — ABNORMAL HIGH (ref 0–5)
pH: 5 (ref 5.0–8.0)

## 2018-10-06 LAB — CBG MONITORING, ED
Glucose-Capillary: 368 mg/dL — ABNORMAL HIGH (ref 70–99)
Glucose-Capillary: 379 mg/dL — ABNORMAL HIGH (ref 70–99)
Glucose-Capillary: 330 mg/dL — ABNORMAL HIGH (ref 70–99)
Glucose-Capillary: 346 mg/dL — ABNORMAL HIGH (ref 70–99)
Glucose-Capillary: 280 mg/dL — ABNORMAL HIGH (ref 70–99)

## 2018-10-06 LAB — COMPREHENSIVE METABOLIC PANEL
ALT: 87 U/L — ABNORMAL HIGH (ref 0–44)
AST: 67 U/L — ABNORMAL HIGH (ref 15–41)
Albumin: 2.5 g/dL — ABNORMAL LOW (ref 3.5–5.0)
Alkaline Phosphatase: 193 U/L — ABNORMAL HIGH (ref 38–126)
Anion gap: 15 (ref 5–15)
BUN: 53 mg/dL — ABNORMAL HIGH (ref 8–23)
CO2: 15 mmol/L — ABNORMAL LOW (ref 22–32)
Calcium: 7.4 mg/dL — ABNORMAL LOW (ref 8.9–10.3)
Chloride: 99 mmol/L (ref 98–111)
Creatinine, Ser: 2.89 mg/dL — ABNORMAL HIGH (ref 0.44–1.00)
GFR calc Af Amer: 19 mL/min — ABNORMAL LOW (ref >60)
GFR calc non Af Amer: 17 mL/min — ABNORMAL LOW (ref >60)
Glucose, Bld: 389 mg/dL — ABNORMAL HIGH (ref 70–99)
Potassium: 4.2 mmol/L (ref 3.5–5.1)
Sodium: 129 mmol/L — ABNORMAL LOW (ref 135–145)
Total Bilirubin: 1.9 mg/dL — ABNORMAL HIGH (ref 0.3–1.2)
Total Protein: 5.7 g/dL — ABNORMAL LOW (ref 6.5–8.1)

## 2018-10-06 LAB — GLUCOSE, CAPILLARY
Glucose-Capillary: 243 mg/dL — ABNORMAL HIGH (ref 70–99)
Glucose-Capillary: 233 mg/dL — ABNORMAL HIGH (ref 70–99)
Glucose-Capillary: 226 mg/dL — ABNORMAL HIGH (ref 70–99)
Glucose-Capillary: 218 mg/dL — ABNORMAL HIGH (ref 70–99)
Glucose-Capillary: 184 mg/dL — ABNORMAL HIGH (ref 70–99)
Glucose-Capillary: 173 mg/dL — ABNORMAL HIGH (ref 70–99)
Glucose-Capillary: 134 mg/dL — ABNORMAL HIGH (ref 70–99)

## 2018-10-06 LAB — HEMOGLOBIN A1C
Hgb A1c MFr Bld: 14 % — ABNORMAL HIGH (ref 4.8–5.6)
Mean Plasma Glucose: 355.1 mg/dL

## 2018-10-06 LAB — MRSA PCR SCREENING: MRSA by PCR: NEGATIVE

## 2018-10-06 LAB — SARS CORONAVIRUS 2: SARS Coronavirus 2: NOT DETECTED

## 2018-10-06 LAB — TSH: TSH: 2.637 u[IU]/mL (ref 0.350–4.500)

## 2018-10-06 MED ORDER — VANCOMYCIN HCL 10 G IV SOLR
1500.0000 mg | Freq: Once | INTRAVENOUS | Status: AC
  Administered 2018-10-06: 1500 mg via INTRAVENOUS
  Filled 2018-10-06: qty 1500

## 2018-10-06 MED ORDER — SODIUM CHLORIDE 0.9 % IV SOLN: 2.0000 g | INTRAVENOUS | Status: DC

## 2018-10-06 MED ORDER — MAGNESIUM SULFATE 2 GM/50ML IV SOLN
2.0000 g | Freq: Once | INTRAVENOUS | Status: AC
  Administered 2018-10-06: 19:00:00 2 g via INTRAVENOUS
  Filled 2018-10-06: qty 50

## 2018-10-06 MED ORDER — ACETAMINOPHEN 325 MG PO TABS
650.0000 mg | ORAL_TABLET | Freq: Once | ORAL | Status: AC
  Administered 2018-10-06: 650 mg via ORAL
  Filled 2018-10-06: qty 2

## 2018-10-06 MED ORDER — POTASSIUM CHLORIDE 10 MEQ/100ML IV SOLN
10.0000 meq | INTRAVENOUS | Status: AC
  Administered 2018-10-06 (×2): 10 meq via INTRAVENOUS
  Filled 2018-10-06 (×2): qty 100

## 2018-10-06 MED ORDER — SODIUM CHLORIDE 0.9 % IV BOLUS
190.0000 mL | Freq: Once | INTRAVENOUS | Status: AC
  Administered 2018-10-06: 190 mL via INTRAVENOUS

## 2018-10-06 MED ORDER — INSULIN REGULAR(HUMAN) IN NACL 100-0.9 UT/100ML-% IV SOLN
INTRAVENOUS | Status: DC
  Administered 2018-10-06: 8.8 [IU]/h via INTRAVENOUS
  Administered 2018-10-06: 22:00:00 7.9 [IU]/h via INTRAVENOUS
  Filled 2018-10-06: qty 100

## 2018-10-06 MED ORDER — ACETAMINOPHEN 325 MG PO TABS: 650.0000 mg | ORAL_TABLET | Freq: Four times a day (QID) | ORAL | Status: DC | PRN

## 2018-10-06 MED ORDER — SODIUM CHLORIDE 0.9 % IV BOLUS
1000.0000 mL | Freq: Once | INTRAVENOUS | Status: AC
  Administered 2018-10-06: 13:00:00 1000 mL via INTRAVENOUS

## 2018-10-06 MED ORDER — SODIUM CHLORIDE 0.9 % IV SOLN
2.0000 g | Freq: Once | INTRAVENOUS | Status: AC
  Administered 2018-10-06: 2 g via INTRAVENOUS
  Filled 2018-10-06: qty 2

## 2018-10-06 MED ORDER — VANCOMYCIN HCL IN DEXTROSE 1-5 GM/200ML-% IV SOLN: 1000.0000 mg | Freq: Once | INTRAVENOUS | Status: DC

## 2018-10-06 MED ORDER — VANCOMYCIN VARIABLE DOSE PER UNSTABLE RENAL FUNCTION (PHARMACIST DOSING): Status: DC

## 2018-10-06 MED ORDER — DEXTROSE-NACL 5-0.45 % IV SOLN
INTRAVENOUS | Status: DC
  Administered 2018-10-06 – 2018-10-07 (×2): via INTRAVENOUS

## 2018-10-06 MED ORDER — STERILE WATER FOR INJECTION IV SOLN
INTRAVENOUS | Status: DC
  Administered 2018-10-06 – 2018-10-07 (×2): via INTRAVENOUS
  Filled 2018-10-06 (×4): qty 850

## 2018-10-06 MED ORDER — INSULIN REGULAR(HUMAN) IN NACL 100-0.9 UT/100ML-% IV SOLN
INTRAVENOUS | Status: DC
  Administered 2018-10-06: 3.2 [IU]/h via INTRAVENOUS
  Administered 2018-10-06: 8.6 [IU]/h via INTRAVENOUS
  Filled 2018-10-06: qty 100

## 2018-10-06 MED ORDER — SODIUM BICARBONATE 8.4 % IV SOLN
50.0000 meq | Freq: Once | INTRAVENOUS | Status: AC
  Administered 2018-10-06: 50 meq via INTRAVENOUS
  Filled 2018-10-06: qty 50

## 2018-10-06 MED ORDER — SODIUM CHLORIDE 0.9 % IV BOLUS
1000.0000 mL | Freq: Once | INTRAVENOUS | Status: AC
  Administered 2018-10-06: 1000 mL via INTRAVENOUS

## 2018-10-06 MED ORDER — METRONIDAZOLE IN NACL 5-0.79 MG/ML-% IV SOLN
500.0000 mg | Freq: Once | INTRAVENOUS | Status: AC
  Administered 2018-10-06: 09:00:00 500 mg via INTRAVENOUS
  Filled 2018-10-06: qty 100

## 2018-10-06 MED ORDER — ACETAMINOPHEN 325 MG PO TABS: 650.0000 mg | ORAL_TABLET | Freq: Once | ORAL | Status: DC

## 2018-10-06 MED ORDER — ENOXAPARIN SODIUM 30 MG/0.3ML ~~LOC~~ SOLN
30.0000 mg | SUBCUTANEOUS | Status: DC
  Administered 2018-10-06: 16:00:00 30 mg via SUBCUTANEOUS
  Filled 2018-10-06: qty 0.3

## 2018-10-06 NOTE — Consult Note (Signed)
ER Consult Note   [name] KNL:[id number] DOB: [birth year] DOA: 10/06/2018  PCP: Elby Showers, MD Consultants:  None Patient coming from:  Home; NOK:   Chief Complaint: Fever, AMS  HPI: Allison Patel is a 63 y.o. female with medical history significant of hypothyroidism; HTN; HLD; and DM presenting with fever, AMS.  She is still quite confused and delirious with marked tachycardia and tachypnea.  She is able to tell me her first name and otherwise can answer simple questions and inappropriately giggles.   ED Course:  Confused since yesterday with a fever.  Fall this AM, no head trauma, head CT negative.  ?urinary source.  COVID negative.  Denying urinary symptoms.  Fluid resuscitated, lactate 4.2-> 4.0.  Seems to be getting better, likely does not need ICU.  Mentating better.  Review of Systems: Unable to effectively perform   Past Medical History:  Diagnosis Date  . Diabetes mellitus without complication ([place])   . Hyperlipidemia   . Hypertension   . Hypothyroidism     Past Surgical History:  Procedure Laterality Date  . ABDOMINAL HYSTERECTOMY    . CESAREAN SECTION     2  . CHOLECYSTECTOMY      Social History   Socioeconomic History  . Marital status: Married    Spouse name: Not on file  . Number of children: Not on file  . Years of education: Not on file  . Highest education level: Not on file  Occupational History  . Not on file  Social Needs  . Financial resource strain: Not on file  . Food insecurity    Worry: Not on file    Inability: Not on file  . Transportation needs    Medical: Not on file    Non-medical: Not on file  Tobacco Use  . Smoking status: Never Smoker  . Smokeless tobacco: Never Used  Substance and Sexual Activity  . Alcohol use: No    Frequency: Never  . Drug use: No  . Sexual activity: Not on file  Lifestyle  . Physical activity    Days per week: Not on file    Minutes per session: Not on file  . Stress: Not on file   Relationships  . Social Herbalist on phone: Not on file    Gets together: Not on file    Attends religious service: Not on file    Active member of club or organization: Not on file    Attends meetings of clubs or organizations: Not on file    Relationship status: Not on file  . Intimate partner violence    Fear of current or ex partner: Not on file    Emotionally abused: Not on file    Physically abused: Not on file    Forced sexual activity: Not on file  Other Topics Concern  . Not on file  Social History Narrative  . Not on file    Allergies  Allergen Reactions  . Macrobid [Nitrofurantoin Macrocrystal] Rash    Family History  Problem Relation Age of Onset  . Diabetes Mother   . Hypertension Mother   . Congestive Heart Failure Mother   . Hypertension Father   . Pneumonia Father   . Pancreatic cancer Brother     Prior to Admission medications   Medication Sig Start Date End Date Taking? Authorizing Provider  ALPRAZolam Duanne Moron) 0.5 MG tablet Take 1 tablet (0.5 mg total) by mouth 2 (two) times daily as needed  for anxiety. 10/17/17  Yes [name], Cresenciano Lick, MD  amLODipine (NORVASC) 5 MG tablet TAKE ONE TABLET BY MOUTH DAILY Patient taking differently: Take 5 mg by mouth daily.  03/04/18  Yes [name], Cresenciano Lick, MD  cloNIDine (CATAPRES) 0.2 MG tablet TAKE ONE TABLET BY MOUTH TWICE A DAY Patient taking differently: Take 0.2 mg by mouth 2 (two) times daily.  07/31/18  Yes [name], Cresenciano Lick, MD  Insulin Glargine (LANTUS) 100 UNIT/ML Solostar Pen Inject 10 Units into the skin daily at 10 pm. Patient taking differently: Inject 20 Units into the skin daily at 10 pm.  07/07/17  Yes Charlynne Cousins, MD  Insulin Lispro (HUMALOG KWIKPEN [place]) Inject 10 Units into the skin 2 (two) times a day.    Yes [provider]  Insulin Pen Needle 31G X 6 MM MISC 1 Device by Does not apply route 2 (two) times daily. 07/07/17  Yes Charlynne Cousins, MD  olmesartan-hydrochlorothiazide  (BENICAR HCT) 40-25 MG tablet TAKE 1 TABLET BY MOUTH DAILY 06/10/18  Yes [name], Cresenciano Lick, MD  rosuvastatin (CRESTOR) 40 MG tablet Take 1 tablet (40 mg total) by mouth daily. 10/17/17  Yes [name], Cresenciano Lick, MD  SitaGLIPtin-MetFORMIN HCl 50-1000 MG TB24 Take 2 tablets by mouth daily.    Yes [provider]    Physical Exam: Vitals:   10/06/18 1300 10/06/18 1315 10/06/18 1330 10/06/18 1345  BP: 136/67 106/75 125/65 132/64  Pulse: (!) 137 (!) 135 (!) 133 (!) 135  Resp: (!) 47 (!) 32 (!) 37 (!) 43  Temp: (!) 102.9 F (39.4 C)     TempSrc: Oral     SpO2: 97% 96% 100% 96%  Weight:      Height:         . General:  Appears quite ill, confused . Eyes:  PERRL, EOMI, normal lids, iris . ENT:  grossly normal hearing, lips & tongue, dry mm; appropriate dentition . Neck:  no LAD, masses or thyromegaly . Cardiovascular:  RR with tachycardia to 120-130s, no m/r/g. No LE edema.  Marland Kitchen Respiratory:   CTA bilaterally with no wheezes/rales/rhonchi.  Increased respiratory effort. . Abdomen:  soft, NT, ND, NABS . Skin:  no rash or induration seen on limited exam . Musculoskeletal:  grossly normal tone BUE/BLE, good ROM, no bony abnormality . Psychiatric:  Confused, able to follow simple directions, minimal conversation . Neurologic:  Unable to perform    Radiological Exams on Admission: Ct Head Wo Contrast  Result Date: 10/06/2018 CLINICAL DATA:  Nausea, vomiting and fever for 4 days. Confusion since 12 noon yesterday. EXAM: CT HEAD WITHOUT CONTRAST TECHNIQUE: Contiguous axial images were obtained from the base of the skull through the vertex without intravenous contrast. COMPARISON:  None. FINDINGS: Brain: No evidence of acute infarction, hemorrhage, hydrocephalus, extra-axial collection or mass lesion/mass effect. Vascular: No hyperdense vessel or unexpected calcification. Skull: Intact.  No focal lesion. Sinuses/Orbits: Status post cataract surgery.  Otherwise negative. Other: None. IMPRESSION:  Negative head CT. Electronically Signed   By: Inge Rise M.D.   On: 10/06/2018 11:14   Dg Chest Port 1 View  Result Date: 10/06/2018 CLINICAL DATA:  Fever, tachypnea, history hypertension, diabetes mellitus EXAM: PORTABLE CHEST 1 VIEW COMPARISON:  Portable exam 0903 hours compared to 07/03/2017 FINDINGS: Borderline of cardiac silhouette, likely accentuated by low lung volumes and lordotic technique. Mediastinal contours and pulmonary vascularity normal. Hypoinflated lungs without infiltrate, pleural effusion or pneumothorax. Bones demineralized. IMPRESSION: No acute abnormalities. Electronically Signed   By:  Lavonia Dana M.D.   On: 10/06/2018 09:39    EKG: Independently reviewed.   5638 - Aflutter vs. Sinus tach with rate 135; RBBB, LPFB, artifact   Labs on Admission: I have personally reviewed the available labs and imaging studies at the time of the admission.  Pertinent labs:   VBG: 7.323/33.08/08/15.4 Lactate 4.2 - > 4.0 Na++ 129 CO2 15 Glucose 389 BUN 53/Creatinine 2.89/GFR 17; 24/0.85 in 11/19 AP 193 Albumin 2.5 AST 67/ALT 87/Bili 1.9 WBC 10.3 Platelets 65 UA: >500 glucose; moderate LE; >300 protein; negative ketones; moderate Hgb; many bacteria; >50 WBC Blood and urine cultures pending  Assessment/Plan Principal Problem:   Severe sepsis (HCC) Active Problems:   Benign essential HTN   Hypercholesterolemia without hypertriglyceridemia   Adult hypothyroidism   Diabetes mellitus type 2, uncontrolled (HCC)   Severe sepsis -SIRS criteria in this patient includes:  fever, tachycardia, tachypnea  -Patient has evidence of acute organ failure with elevated lactate to 4.2 (repeat 4.0), acute renal failure, and AMS -While awaiting blood cultures, this appears to be a preseptic condition. -Sepsis protocol initiated -Patient had initial lactate >4 and so has received the 30 cc/kg IVF bolus. -Suspected source is UTI -Blood and urine cultures pending -Based on persistent  lactic acidosis in conjunction with hemodynamic instability (tachycardia, tachypnea despite initial IVF bolus), I have requested that PCCM admit the patient and Dr. Tamala Julian has graciously agreed. -Treat with IV Cefepime/Vanc for undifferentiated sepsis  AMS -Likely associated with severe sepsis -Consider LP if not clearing with treatment -Negative head CT -Unlikely true DKA given lack of ketonuria; she appears to have chronically uncontrolled DM and this relative immunosuppression may be exacerbating her infectious condition  HTN -Home meds are Norvasc, Catapres, and Benicar HCT -ARB/diuretic needs to be held due to renal failure -Will defer to PCCM about whether to continue CCB and Catapres  HLD -Continue Crestor if able to safely swallow medication  DM -Check A1c -hold Glucophage/Sitagliptin -Suggest consideration of insulin drip/glucostabilizer protocol given acidosis and hyperglycemia -Prior A1c was >14 in 3/19   Note: This patient has been tested and is negative for the novel coronavirus COVID-19.  Retesting might be considered if no other source for her sepsis is appreciated.  Thank you for this consult and for accepting the patient on the PCCM service at this time.  Unfortunately, she appears to be at high risk for further clinical deterioration.  [facility] will be happy to assume care once the patient is stabilized.       Karmen Bongo MD [facility]   How to contact the [facility] Attending or Consulting provider [place] or covering provider during after hours [place], for this patient?  1. Check the care team in [facility] and look for a) attending/consulting [facility] provider listed and b) the [facility] team listed 2. Log into [email or web address] and use [place]'s universal password to access. If you do not have the password, please contact the hospital operator. 3. Locate the [facility] provider you are looking for under [facility] and page to a number that you can be directly reached. 4.  If you still have difficulty reaching the provider, please page the [facility] (Director on Call) for the Hospitalists listed on amion for assistance.   10/06/2018, 2:01 PM

## 2018-10-06 NOTE — H&P (Signed)
NAME:  Allison Patel, MRN:  [id number], DOB:  12/06/1955, LOS: 0 ADMISSION DATE:  10/06/2018, CONSULTATION DATE:  10/06/18 REFERRING MD:  ER, CHIEF COMPLAINT:  confusion   Brief History   63 year old woman with hx DM, HTN p/w severe sepsis and +UA.  History of present illness   63 year old woman with DM, HTN, prior gluteal abscess presenting with confusion since Sunday ~3 days.  Initially got better with PO fluids but got worse last night into day of admission.  Admission labs notable for +UA, actidosis, acute kidney injury, and lactic acidemia.  Lactate and mental status did not improve with fluids and abx in ER so intensivist asked to admit.  Past Medical History  DM2, A1c > 14 06/26/17 HTN Anxiety/depression [place] [facility] Events   6/16 admission  Consults:  None  Procedures:  None  Significant Diagnostic Tests:  CT head 6/16 no acute issues CT A/P 6/16>>>  Micro Data:  Urine culture 6/16>>> Blood cultures 6/16>>> Covid 19 Neg HIV neg 2019  Antimicrobials:  Vanc/cefepime/flagyl x 1 in ER Ceftriaxone 10/06/18>>>   Interim history/subjective:  As above. Patient unable to articulate why she feels ill.  Objective   Blood pressure 136/67, pulse (!) 137, temperature (!) 102.9 F (39.4 C), temperature source Oral, resp. rate (!) 47, height 4' 11.5" (1.511 m), weight 73 kg, SpO2 97 %.        Intake/Output Summary (Last 24 hours) at 10/06/2018 1356 Last data filed at 10/06/2018 1330 Gross per 24 hour  Intake 3390 ml  Output -  Net 3390 ml   Filed Weights   10/06/18 0820  Weight: 73 kg    Examination: General: elderly woman in NAD HENT: MMM, no thrush Lungs: Clear, tachypneic Cardiovascular: Tachycardic, regular, ext warm, +SEM Abdomen: Soft, +BS Extremities: No edema Neuro: Follow commands, has stuttering responses to questions and loses train of thought easily, unable to finish sentences GU: bedside urinal with dark cloudy fluid  Resolved  Hospital Problem list   NA  Assessment & Plan:  # Severe sepsis: presumed source at this time is complicated UTI. End stage manifestations are altered mental status, acute kidney injury, acidosis.  2L IVF in ER.  Ongoing lactic acidosis sometimes portends worse prognosis but no evidence it should guide rescuscitation. # Tachycardia reactive to fevers and sepsis, unchanged from prior admission, RBBB sinus tach # Hyperglycemia without ketones in urine # Delirium related to sepsis # Acute kidney injury related to poor PO and infection, cannot r/o developing ATN # Hx of gluteal abscess # Heart murmur   - Bicarb gtt overnight to help with tachypnea which is reactive to her acidosis - Monitor FEN and UoP - CT A/P with oral contrast this will eval for (A) any concerning causes for ongoing lactic acidosis, (B) any pyelonephritis or obstructive uropathy (C) make sure nothing going on in pelvis like her admission last year - Monitor mental status and breathing pattern closely in ICU, high risk for decompensation - Insulin gtt until we have handle on PO and infection  Best practice:  Diet: NPO Pain/Anxiety/Delirium protocol (if indicated): N/A, PTA benzodiazepines on hold VAP protocol (if indicated): NA DVT prophylaxis: Lovenox started GI prophylaxis: Not indicated at this time Glucose control: Insulin gtt CC protocol Mobility: bedrest Code Status: full Family Communication: updated husband on day of admission Disposition: ICU   Labs   CBC: Recent Labs  Lab 10/06/18 0845 10/06/18 1115  WBC 10.3  --   NEUTROABS  7.2  --   HGB 13.3 12.9  HCT 39.1 38.0  MCV 82.1  --   PLT 65*  --     Basic Metabolic Panel: Recent Labs  Lab 10/06/18 0845 10/06/18 1115  NA 129* 132*  K 4.2 4.1  CL 99  --   CO2 15*  --   GLUCOSE 389*  --   BUN 53*  --   CREATININE 2.89*  --   CALCIUM 7.4*  --    GFR: Estimated Creatinine Clearance: 17.8 mL/min (A) (by C-G formula based on SCr of 2.89 mg/dL  (H)). Recent Labs  Lab 10/06/18 0835 10/06/18 0845 10/06/18 1104  WBC  --  10.3  --   LATICACIDVEN 4.2*  --  4.0*    Liver Function Tests: Recent Labs  Lab 10/06/18 0845  AST 67*  ALT 87*  ALKPHOS 193*  BILITOT 1.9*  PROT 5.7*  ALBUMIN 2.5*   No results for input(s): LIPASE, AMYLASE in the last 168 hours. No results for input(s): AMMONIA in the last 168 hours.  ABG    Component Value Date/Time   HCO3 17.4 (L) 10/06/2018 1115   TCO2 18 (L) 10/06/2018 1115   ACIDBASEDEF 8.0 (H) 10/06/2018 1115   O2SAT 65.0 10/06/2018 1115     Coagulation Profile: No results for input(s): INR, PROTIME in the last 168 hours.  Cardiac Enzymes: No results for input(s): CKTOTAL, CKMB, CKMBINDEX, TROPONINI in the last 168 hours.  HbA1C: Hgb A1c MFr Bld  Date/Time Value Ref Range Status  06/26/2017 09:26 AM >14.0 (H) <5.7 % of total Hgb Final    Comment:    Verified by repeat analysis. . For someone without known diabetes, a hemoglobin A1c value of 6.5% or greater indicates that they may have  diabetes and this should be confirmed with a follow-up  test. . For someone with known diabetes, a value <7% indicates  that their diabetes is well controlled and a value  greater than or equal to 7% indicates suboptimal  control. A1c targets should be individualized based on  duration of diabetes, age, comorbid conditions, and  other considerations. . Currently, no consensus exists regarding use of hemoglobin A1c for diagnosis of diabetes for children. .     CBG: Recent Labs  Lab 10/06/18 0818 10/06/18 1117 10/06/18 1234 10/06/18 1321  GLUCAP 368* 379* 330* 346*    Review of Systems:   Cannot assess due to AMS  Past Medical History  She,  has a past medical history of Diabetes mellitus without complication ([place]), Hyperlipidemia, Hypertension, and Hypothyroidism.   Surgical History    Past Surgical History:  Procedure Laterality Date  . ABDOMINAL HYSTERECTOMY    .  CESAREAN SECTION     2  . CHOLECYSTECTOMY       Social History   reports that she has never smoked. She has never used smokeless tobacco. She reports that she does not drink alcohol or use drugs.   Family History   Her family history includes Congestive Heart Failure in her mother; Diabetes in her mother; Hypertension in her father and mother; Pancreatic cancer in her brother; Pneumonia in her father.   Allergies Allergies  Allergen Reactions  . Macrobid [Nitrofurantoin Macrocrystal] Rash     Home Medications  Prior to Admission medications   Medication Sig Start Date End Date Taking? Authorizing Provider  ALPRAZolam Duanne Moron) 0.5 MG tablet Take 1 tablet (0.5 mg total) by mouth 2 (two) times daily as needed for anxiety. 10/17/17  Yes  Elby Showers, MD  amLODipine (NORVASC) 5 MG tablet TAKE ONE TABLET BY MOUTH DAILY Patient taking differently: Take 5 mg by mouth daily.  03/04/18  Yes [name], Cresenciano Lick, MD  cloNIDine (CATAPRES) 0.2 MG tablet TAKE ONE TABLET BY MOUTH TWICE A DAY Patient taking differently: Take 0.2 mg by mouth 2 (two) times daily.  07/31/18  Yes [name], Cresenciano Lick, MD  Insulin Glargine (LANTUS) 100 UNIT/ML Solostar Pen Inject 10 Units into the skin daily at 10 pm. Patient taking differently: Inject 20 Units into the skin daily at 10 pm.  07/07/17  Yes Charlynne Cousins, MD  Insulin Lispro (HUMALOG KWIKPEN ) Inject 10 Units into the skin 2 (two) times a day.    Yes [provider]  Insulin Pen Needle 31G X 6 MM MISC 1 Device by Does not apply route 2 (two) times daily. 07/07/17  Yes Charlynne Cousins, MD  olmesartan-hydrochlorothiazide (BENICAR HCT) 40-25 MG tablet TAKE 1 TABLET BY MOUTH DAILY 06/10/18  Yes [name], Cresenciano Lick, MD  rosuvastatin (CRESTOR) 40 MG tablet Take 1 tablet (40 mg total) by mouth daily. 10/17/17  Yes [name], Cresenciano Lick, MD  SitaGLIPtin-MetFORMIN HCl 50-1000 MG TB24 Take 2 tablets by mouth daily.    Yes [provider]     Critical care time:    The patient is critically ill with multiple organ systems failure and requires high complexity decision making for assessment and support, frequent evaluation and titration of therapies, application of advanced monitoring technologies and extensive interpretation of multiple databases. Critical Care Time devoted to patient care services described in this note independent of APP/resident time (if applicable)  is 31 minutes.   Erskine Emery MD [facility] 10/06/18 2:07 PM Personal pager: [phone] If unanswered, please page CCM On-call: [phone]

## 2018-10-06 NOTE — ED Notes (Signed)
Patient transported to CT 

## 2018-10-06 NOTE — ED Triage Notes (Addendum)
Pt from home with ems for AMS and fever since Sunday. Pt also c.o n/v. Per family pt has been increasingly confused since noon yesterday. Pt alert upon arrival to ED, oriented to self only. CBGs alkso running high at home, 410 with ems. Positive orthostatic changes, BP 72/50 while standing with EMS. Pt 95% on room air, placed on 6L with ems to get a sat of 98%  EMS gave 1G tylenol en route

## 2018-10-06 NOTE — ED Notes (Signed)
vacomycin not given at this time due to incompatibility with flagyl and cefepime

## 2018-10-06 NOTE — ED Notes (Signed)
Pt returned from CT at this time.  

## 2018-10-06 NOTE — Progress Notes (Signed)
Pharmacy Antibiotic Note  Allison Patel is a 63 y.o. female admitted on 10/06/2018 with sepsis. Pharmacy has been consulted for vancomycin and cefepime dosing. Pt is febrile with Tmax 101.9 and WBC is WNL. Scr is well above baseline at 2.89. Lactic acid is >4.   Plan: Vancomycin 1500mg  IV x 1 then trend Scr for further doses Cefepime 2gm IV Q24H F/u renal fxn, C&S, clinical status and peak/trough at SS  Height: 4' 11.5" (151.1 cm) Weight: 161 lb (73 kg) IBW/kg (Calculated) : 44.35  Temp (24hrs), Avg:101.9 F (38.8 C), Min:101.9 F (38.8 C), Max:101.9 F (38.8 C)  No results for input(s): WBC, CREATININE, LATICACIDVEN, VANCOTROUGH, VANCOPEAK, VANCORANDOM, GENTTROUGH, GENTPEAK, GENTRANDOM, TOBRATROUGH, TOBRAPEAK, TOBRARND, AMIKACINPEAK, AMIKACINTROU, AMIKACIN in the last 168 hours.  CrCl cannot be calculated (Patient's most recent lab result is older than the maximum 21 days allowed.).    Allergies  Allergen Reactions  . Macrobid [Nitrofurantoin Macrocrystal] Rash    Antimicrobials this admission: Vanc 6/16>> Cefepime 6/16>> Flagyl x 1 6/16  Dose adjustments this admission: N/A  Microbiology results: Pending  Thank you for allowing pharmacy to be a part of this patient's care.  Yashar Inclan, Rande Lawman 10/06/2018 8:50 AM

## 2018-10-06 NOTE — ED Notes (Signed)
CBG 368

## 2018-10-06 NOTE — ED Notes (Signed)
Notified Dr. Vanita Panda of pt's critical VBG results

## 2018-10-06 NOTE — ED Notes (Signed)
Admitting provider at bedside for evaluation.

## 2018-10-06 NOTE — ED Notes (Signed)
Date and time results received: 10/06/18 1010am  (use smartphrase ".now" to insert current time)  Test: lactic acid Critical Value: 4.2  Name of Provider Notified: Armstead Peaks, PA-C  Orders Received? Or Actions Taken?: Orders Received - See Orders for details

## 2018-10-06 NOTE — ED Provider Notes (Signed)
[facility] EMERGENCY DEPARTMENT Provider Note   CSN: [id number] Arrival date & time: 10/06/18  0810    History   Chief Complaint Chief Complaint  Patient presents with   Altered Mental Status   Fever    HPI MIRTHA JAIN is a 63 y.o. female with history of hypertension, hypothyroidism, diabetes who presents with a 2-day history of fever and altered mental status.  Patient is also had some nausea and vomiting for the past 2 days.  Her blood sugars have been in the 300s when she is normally well controlled.  Patient denies any cough, shortness of breath, chest pain, abdominal pain, urinary symptoms.  Most of patient's history obtained from her daughter.  She noticed that her mother was becoming more confused around noon yesterday.  She has not been making sense and has been unable to answer questions.  Patient's husband told the daughter that she fell right before the daughter got there this morning.  They do not believe she hit her head.  Patient had 1 L NS prior to arrival with EMS.  EMS also gave 1 g of Tylenol.  She was 95% on room air and she was placed on 6 L which brought up to 98%.     HPI  Past Medical History:  Diagnosis Date   Diabetes mellitus without complication ([place])    Hyperlipidemia    Hypertension    Hypothyroidism     Patient Active Problem List   Diagnosis Date Noted   Severe sepsis ([place]) 10/06/2018   Benign essential HTN 03/14/2014   Hypertrophic obstructive cardiomyopathy ([place]) 03/14/2014   Adiposity 03/14/2014   Bone/cartilage disorder 03/14/2014   Hypercholesterolemia without hypertriglyceridemia 03/14/2014   Adult hypothyroidism 03/14/2014   Diabetes mellitus type 2, uncontrolled ([place]) 03/14/2014    Past Surgical History:  Procedure Laterality Date   ABDOMINAL HYSTERECTOMY     CESAREAN SECTION     2   CHOLECYSTECTOMY       OB History   No obstetric history on file.      Home Medications    Prior to  Admission medications   Medication Sig Start Date End Date Taking? Authorizing Provider  ALPRAZolam Duanne Moron) 0.5 MG tablet Take 1 tablet (0.5 mg total) by mouth 2 (two) times daily as needed for anxiety. 10/17/17  Yes [name], Cresenciano Lick, MD  amLODipine (NORVASC) 5 MG tablet TAKE ONE TABLET BY MOUTH DAILY Patient taking differently: Take 5 mg by mouth daily.  03/04/18  Yes [name], Cresenciano Lick, MD  cloNIDine (CATAPRES) 0.2 MG tablet TAKE ONE TABLET BY MOUTH TWICE A DAY Patient taking differently: Take 0.2 mg by mouth 2 (two) times daily.  07/31/18  Yes [name], Cresenciano Lick, MD  Insulin Glargine (LANTUS) 100 UNIT/ML Solostar Pen Inject 10 Units into the skin daily at 10 pm. Patient taking differently: Inject 20 Units into the skin daily at 10 pm.  07/07/17  Yes Charlynne Cousins, MD  Insulin Lispro (HUMALOG KWIKPEN ) Inject 10 Units into the skin 2 (two) times a day.    Yes [provider]  Insulin Pen Needle 31G X 6 MM MISC 1 Device by Does not apply route 2 (two) times daily. 07/07/17  Yes Charlynne Cousins, MD  olmesartan-hydrochlorothiazide (BENICAR HCT) 40-25 MG tablet TAKE 1 TABLET BY MOUTH DAILY 06/10/18  Yes [name], Cresenciano Lick, MD  rosuvastatin (CRESTOR) 40 MG tablet Take 1 tablet (40 mg total) by mouth daily. 10/17/17  Yes [name], Cresenciano Lick, MD  SitaGLIPtin-MetFORMIN HCl 50-1000 MG TB24 Take 2 tablets by mouth daily.    Yes [provider]    Family History Family History  Problem Relation Age of Onset   Diabetes Mother    Hypertension Mother    Congestive Heart Failure Mother    Hypertension Father    Pneumonia Father    Pancreatic cancer Brother     Social History Social History   Tobacco Use   Smoking status: Never Smoker   Smokeless tobacco: Never Used  Substance Use Topics   Alcohol use: No    Frequency: Never   Drug use: No     Allergies   Macrobid [nitrofurantoin macrocrystal]   Review of Systems Review of Systems  Constitutional: Positive for fever.  Negative for chills.  HENT: Negative for facial swelling and sore throat.   Respiratory: Negative for shortness of breath.   Cardiovascular: Negative for chest pain.  Gastrointestinal: Positive for nausea and vomiting. Negative for abdominal pain.  Genitourinary: Negative for dysuria.  Musculoskeletal: Negative for back pain.  Skin: Negative for rash and wound.  Neurological: Negative for headaches.  Psychiatric/Behavioral: Positive for confusion. The patient is not nervous/anxious.      Physical Exam Updated Vital Signs BP 132/64    Pulse (!) 135    Temp (!) 102.9 F (39.4 C) (Oral)    Resp (!) 43    Ht 4' 11.5" (1.511 m)    Wt 73 kg    SpO2 96%    BMI 31.97 kg/m   Physical Exam Vitals signs and nursing note reviewed.  Constitutional:      General: She is not in acute distress.    Appearance: She is well-developed. She is not diaphoretic.  HENT:     Head: Normocephalic and atraumatic.     Mouth/Throat:     Pharynx: No oropharyngeal exudate.  Eyes:     General: No scleral icterus.       Right eye: No discharge.        Left eye: No discharge.     Conjunctiva/sclera: Conjunctivae normal.     Pupils: Pupils are equal, round, and reactive to light.  Neck:     Musculoskeletal: Normal range of motion and neck supple.     Thyroid: No thyromegaly.  Cardiovascular:     Rate and Rhythm: Regular rhythm. Tachycardia present.     Heart sounds: Normal heart sounds. No murmur. No friction rub. No gallop.   Pulmonary:     Effort: Pulmonary effort is normal. No respiratory distress.     Breath sounds: Normal breath sounds. No stridor. No wheezing or rales.  Abdominal:     General: Bowel sounds are normal. There is no distension.     Palpations: Abdomen is soft.     Tenderness: There is no abdominal tenderness. There is no guarding or rebound.  Lymphadenopathy:     Cervical: No cervical adenopathy.  Skin:    General: Skin is warm and dry.     Coloration: Skin is not pale.      Findings: No rash.  Neurological:     Mental Status: She is alert. She is confused.     GCS: GCS eye subscore is 4. GCS verbal subscore is 5. GCS motor subscore is 6.     Coordination: Coordination normal.     Comments: Alert to self and POTUS; patient has difficulty answering questions, seems to think she knows other orientation questions, but unable to verbalize the answers  ED Treatments / Results  Labs (all labs ordered are listed, but only abnormal results are displayed) Labs Reviewed  LACTIC ACID, PLASMA - Abnormal; Notable for the following components:      Result Value   Lactic Acid, Venous 4.2 (*)    All other components within normal limits  LACTIC ACID, PLASMA - Abnormal; Notable for the following components:   Lactic Acid, Venous 4.0 (*)    All other components within normal limits  COMPREHENSIVE METABOLIC PANEL - Abnormal; Notable for the following components:   Sodium 129 (*)    CO2 15 (*)    Glucose, Bld 389 (*)    BUN 53 (*)    Creatinine, Ser 2.89 (*)    Calcium 7.4 (*)    Total Protein 5.7 (*)    Albumin 2.5 (*)    AST 67 (*)    ALT 87 (*)    Alkaline Phosphatase 193 (*)    Total Bilirubin 1.9 (*)    GFR calc non Af Amer 17 (*)    GFR calc Af Amer 19 (*)    All other components within normal limits  CBC WITH DIFFERENTIAL/PLATELET - Abnormal; Notable for the following components:   Platelets 65 (*)    Lymphs Abs 0.2 (*)    Abs Immature Granulocytes 2.15 (*)    All other components within normal limits  URINALYSIS, ROUTINE W REFLEX MICROSCOPIC - Abnormal; Notable for the following components:   Color, Urine AMBER (*)    APPearance TURBID (*)    Glucose, UA >=500 (*)    Hgb urine dipstick MODERATE (*)    Protein, ur >=300 (*)    Leukocytes,Ua MODERATE (*)    WBC, UA >50 (*)    Bacteria, UA MANY (*)    Non Squamous Epithelial 0-5 (*)    All other components within normal limits  CBG MONITORING, ED - Abnormal; Notable for the following components:    Glucose-Capillary 368 (*)    All other components within normal limits  POCT I-STAT EG7 - Abnormal; Notable for the following components:   pCO2, Ven 33.4 (*)    Bicarbonate 17.4 (*)    TCO2 18 (*)    Acid-base deficit 8.0 (*)    Sodium 132 (*)    Calcium, Ion 0.94 (*)    All other components within normal limits  CBG MONITORING, ED - Abnormal; Notable for the following components:   Glucose-Capillary 379 (*)    All other components within normal limits  CBG MONITORING, ED - Abnormal; Notable for the following components:   Glucose-Capillary 330 (*)    All other components within normal limits  CBG MONITORING, ED - Abnormal; Notable for the following components:   Glucose-Capillary 346 (*)    All other components within normal limits  SARS CORONAVIRUS 2  CULTURE, BLOOD (ROUTINE X 2)  CULTURE, BLOOD (ROUTINE X 2)  URINE CULTURE  TSH  BLOOD GAS, VENOUS  HIV ANTIBODY (ROUTINE TESTING W REFLEX)  HEMOGLOBIN A1C    EKG EKG Interpretation  Date/Time:  Tuesday October 06 2018 08:13:54 EDT Ventricular Rate:  135 PR Interval:    QRS Duration: 146 QT Interval:  379 QTC Calculation: 569 R Axis:   -171 Text Interpretation:  rapid, narrow complex QRS, aflutter w artefact or sinus tach Ventricular premature complex RBBB and LPFB Artifact Abnormal ECG Confirmed by Carmin Muskrat [phone]) on 10/06/2018 8:22:43 AM   Radiology Ct Head Wo Contrast  Result Date: 10/06/2018 CLINICAL DATA:  Nausea, vomiting and  fever for 4 days. Confusion since 12 noon yesterday. EXAM: CT HEAD WITHOUT CONTRAST TECHNIQUE: Contiguous axial images were obtained from the base of the skull through the vertex without intravenous contrast. COMPARISON:  None. FINDINGS: Brain: No evidence of acute infarction, hemorrhage, hydrocephalus, extra-axial collection or mass lesion/mass effect. Vascular: No hyperdense vessel or unexpected calcification. Skull: Intact.  No focal lesion. Sinuses/Orbits: Status post cataract  surgery.  Otherwise negative. Other: None. IMPRESSION: Negative head CT. Electronically Signed   By: Inge Rise M.D.   On: 10/06/2018 11:14   Dg Chest Port 1 View  Result Date: 10/06/2018 CLINICAL DATA:  Fever, tachypnea, history hypertension, diabetes mellitus EXAM: PORTABLE CHEST 1 VIEW COMPARISON:  Portable exam 0903 hours compared to 07/03/2017 FINDINGS: Borderline of cardiac silhouette, likely accentuated by low lung volumes and lordotic technique. Mediastinal contours and pulmonary vascularity normal. Hypoinflated lungs without infiltrate, pleural effusion or pneumothorax. Bones demineralized. IMPRESSION: No acute abnormalities. Electronically Signed   By: Lavonia Dana M.D.   On: 10/06/2018 09:39    Procedures .Critical Care Performed by: Frederica Kuster, PA-C Authorized by: Frederica Kuster, PA-C   Critical care provider statement:    Critical care time (minutes):  45   Critical care was necessary to treat or prevent imminent or life-threatening deterioration of the following conditions:  Sepsis and renal failure   Critical care was time spent personally by me on the following activities:  Discussions with consultants, evaluation of patient's response to treatment, examination of patient, ordering and performing treatments and interventions, ordering and review of laboratory studies, ordering and review of radiographic studies, pulse oximetry, re-evaluation of patient's condition, obtaining history from patient or surrogate and review of old charts   I assumed direction of critical care for this patient from another provider in my specialty: no     (including critical care time)  Medications Ordered in ED Medications  dextrose 5 %-0.45 % sodium chloride infusion (has no administration in time range)  enoxaparin (LOVENOX) injection 30 mg (has no administration in time range)  acetaminophen (TYLENOL) tablet 650 mg (has no administration in time range)  cefTRIAXone (ROCEPHIN) 2 g  in sodium chloride 0.9 % 100 mL IVPB (has no administration in time range)  insulin regular, human (MYXREDLIN) 100 units/ 100 mL infusion (has no administration in time range)  sodium bicarbonate 150 mEq in sterile water 1,000 mL infusion (has no administration in time range)  sodium bicarbonate injection 50 mEq (has no administration in time range)  ceFEPIme (MAXIPIME) 2 g in sodium chloride 0.9 % 100 mL IVPB (0 g Intravenous Stopped 10/06/18 0954)  metroNIDAZOLE (FLAGYL) IVPB 500 mg (0 mg Intravenous Stopped 10/06/18 1028)  sodium chloride 0.9 % bolus 1,000 mL (0 mLs Intravenous Stopped 10/06/18 0933)  vancomycin (VANCOCIN) 1,500 mg in sodium chloride 0.9 % 500 mL IVPB (1,500 mg Intravenous New Bag/Given 10/06/18 1033)  sodium chloride 0.9 % bolus 190 mL (0 mLs Intravenous Stopped 10/06/18 1054)  potassium chloride 10 mEq in 100 mL IVPB (10 mEq Intravenous New Bag/Given 10/06/18 1245)  sodium chloride 0.9 % bolus 1,000 mL (0 mLs Intravenous Stopped 10/06/18 1330)  acetaminophen (TYLENOL) tablet 650 mg (650 mg Oral Given 10/06/18 1334)     Initial Impression / Assessment and Plan / ED Course  I have reviewed the triage vital signs and the nursing notes.  Pertinent labs & imaging results that were available during my care of the patient were reviewed by me and considered in my medical decision making (see  chart for details).  Clinical Course as of Oct 05 1445  Tue Oct 06, 2018  1109 Sepsis reassessment performed following antibiotics and fluid resuscitation.  Patient's mental status is improving.     [AL]    Clinical Course User Index [AL] Frederica Kuster, PA-C       Patient presenting with altered mental status and fever.  Code sepsis called.  Broad-spectrum antibiotics initiated as well as weight-based fluid resuscitation.  Patient found to have UTI.  Patient initially declined any urinary symptoms, after fluids and antibiotics, patient's mentation has improved somewhat and she is able to  tell me that she has had some urinary frequency the past few days.  Initial lactic 4.2.  Repeat following fluids and antibiotics, 4.0.  Patient found to have AKI with BUN 53 and creatinine 2.89; CO2 15, anion gap 15.  LFTs mildly elevated at AST 67, ALT 87, alk phos 193, total bilirubin 1.9.  Patient with pH within normal limits and no ketones in the urine, however may have a nonketotic hyper osmolar state.  Insulin infusion initiated with dextrose PRN.  I discussed patient case with Dr. Lorin Mercy with [facility] who evaluated the patient and requests evaluation by critical care.  Dr. Tamala Julian with critical care will admit the patient.  I appreciate the blood consultants for their assistance with the patient. Patient's daughter, Caryl Pina, updated with plan of care.  Patient also evaluated by my attending, Dr. Vanita Panda, who guided the patient's management and agrees with plan.  Patient's daughter, Caryl Pina: [phone]  Final Clinical Impressions(s) / ED Diagnoses   Final diagnoses:  Sepsis with acute renal failure and septic shock, due to unspecified organism, unspecified acute renal failure type [facility])    ED Discharge Orders    None       Frederica Kuster, PA-C 10/06/18 1447    Carmin Muskrat, MD 10/09/18 [phone]

## 2018-10-07 ENCOUNTER — Inpatient Hospital Stay (HOSPITAL_COMMUNITY): Payer: No Typology Code available for payment source

## 2018-10-07 LAB — HEPATIC FUNCTION PANEL
ALT: 67 U/L — ABNORMAL HIGH (ref 0–44)
AST: 83 U/L — ABNORMAL HIGH (ref 15–41)
Albumin: 2 g/dL — ABNORMAL LOW (ref 3.5–5.0)
Alkaline Phosphatase: 243 U/L — ABNORMAL HIGH (ref 38–126)
Bilirubin, Direct: 2 mg/dL — ABNORMAL HIGH (ref 0.0–0.2)
Indirect Bilirubin: 1.2 mg/dL — ABNORMAL HIGH (ref 0.3–0.9)
Total Bilirubin: 3.2 mg/dL — ABNORMAL HIGH (ref 0.3–1.2)
Total Protein: 5 g/dL — ABNORMAL LOW (ref 6.5–8.1)

## 2018-10-07 LAB — BLOOD CULTURE ID PANEL (REFLEXED)

## 2018-10-07 LAB — GLUCOSE, CAPILLARY
Glucose-Capillary: 119 mg/dL — ABNORMAL HIGH (ref 70–99)
Glucose-Capillary: 122 mg/dL — ABNORMAL HIGH (ref 70–99)
Glucose-Capillary: 134 mg/dL — ABNORMAL HIGH (ref 70–99)
Glucose-Capillary: 133 mg/dL — ABNORMAL HIGH (ref 70–99)
Glucose-Capillary: 149 mg/dL — ABNORMAL HIGH (ref 70–99)
Glucose-Capillary: 156 mg/dL — ABNORMAL HIGH (ref 70–99)
Glucose-Capillary: 168 mg/dL — ABNORMAL HIGH (ref 70–99)
Glucose-Capillary: 170 mg/dL — ABNORMAL HIGH (ref 70–99)
Glucose-Capillary: 173 mg/dL — ABNORMAL HIGH (ref 70–99)
Glucose-Capillary: 228 mg/dL — ABNORMAL HIGH (ref 70–99)
Glucose-Capillary: 179 mg/dL — ABNORMAL HIGH (ref 70–99)
Glucose-Capillary: 179 mg/dL — ABNORMAL HIGH (ref 70–99)
Glucose-Capillary: 170 mg/dL — ABNORMAL HIGH (ref 70–99)
Glucose-Capillary: 177 mg/dL — ABNORMAL HIGH (ref 70–99)
Glucose-Capillary: 150 mg/dL — ABNORMAL HIGH (ref 70–99)
Glucose-Capillary: 139 mg/dL — ABNORMAL HIGH (ref 70–99)

## 2018-10-07 LAB — IRON AND TIBC
Iron: 7 ug/dL — ABNORMAL LOW (ref 28–170)
Saturation Ratios: 4 % — ABNORMAL LOW (ref 10.4–31.8)
TIBC: 167 ug/dL — ABNORMAL LOW (ref 250–450)
UIBC: 160 ug/dL

## 2018-10-07 LAB — BASIC METABOLIC PANEL
Anion gap: 12 (ref 5–15)
BUN: 53 mg/dL — ABNORMAL HIGH (ref 8–23)
CO2: 22 mmol/L (ref 22–32)
Calcium: 6.6 mg/dL — ABNORMAL LOW (ref 8.9–10.3)
Chloride: 98 mmol/L (ref 98–111)
Creatinine, Ser: 3.23 mg/dL — ABNORMAL HIGH (ref 0.44–1.00)
GFR calc Af Amer: 17 mL/min — ABNORMAL LOW (ref >60)
GFR calc non Af Amer: 15 mL/min — ABNORMAL LOW (ref >60)
Glucose, Bld: 180 mg/dL — ABNORMAL HIGH (ref 70–99)
Potassium: 3.5 mmol/L (ref 3.5–5.1)
Sodium: 132 mmol/L — ABNORMAL LOW (ref 135–145)

## 2018-10-07 LAB — CBC
HCT: 32.7 % — ABNORMAL LOW (ref 36.0–46.0)
Hemoglobin: 11.3 g/dL — ABNORMAL LOW (ref 12.0–15.0)
MCH: 28 pg (ref 26.0–34.0)
MCHC: 34.6 g/dL (ref 30.0–36.0)
MCV: 80.9 fL (ref 80.0–100.0)
Platelets: 36 10*3/uL — ABNORMAL LOW (ref 150–400)
RBC: 4.04 MIL/uL (ref 3.87–5.11)
RDW: 14.1 % (ref 11.5–15.5)
WBC: 8.8 10*3/uL (ref 4.0–10.5)
nRBC: 0 % (ref 0.0–0.2)

## 2018-10-07 LAB — LACTATE DEHYDROGENASE: LDH: 604 U/L — ABNORMAL HIGH (ref 98–192)

## 2018-10-07 LAB — PHOSPHORUS: Phosphorus: 2.1 mg/dL — ABNORMAL LOW (ref 2.5–4.6)

## 2018-10-07 LAB — MAGNESIUM: Magnesium: 1.6 mg/dL — ABNORMAL LOW (ref 1.7–2.4)

## 2018-10-07 MED ORDER — LACTATED RINGERS IV SOLN
INTRAVENOUS | Status: DC
  Administered 2018-10-07: 11:00:00 via INTRAVENOUS

## 2018-10-07 MED ORDER — INSULIN DETEMIR 100 UNIT/ML ~~LOC~~ SOLN
10.0000 [IU] | Freq: Two times a day (BID) | SUBCUTANEOUS | Status: DC
  Administered 2018-10-07 – 2018-10-10 (×7): 10 [IU] via SUBCUTANEOUS
  Filled 2018-10-07 (×8): qty 0.1

## 2018-10-07 MED ORDER — MAGNESIUM SULFATE 2 GM/50ML IV SOLN
2.0000 g | Freq: Once | INTRAVENOUS | Status: AC
  Administered 2018-10-07: 11:00:00 2 g via INTRAVENOUS
  Filled 2018-10-07: qty 50

## 2018-10-07 MED ORDER — INSULIN ASPART 100 UNIT/ML ~~LOC~~ SOLN
1.0000 [IU] | SUBCUTANEOUS | Status: DC
  Administered 2018-10-07: 1 [IU] via SUBCUTANEOUS
  Administered 2018-10-07 – 2018-10-09 (×3): 2 [IU] via SUBCUTANEOUS
  Administered 2018-10-09 (×3): 1 [IU] via SUBCUTANEOUS
  Administered 2018-10-10: 2 [IU] via SUBCUTANEOUS

## 2018-10-07 MED ORDER — SODIUM CHLORIDE 0.9 % IV SOLN
2.0000 g | INTRAVENOUS | Status: DC
  Administered 2018-10-07 – 2018-10-08 (×2): 2 g via INTRAVENOUS
  Filled 2018-10-07 (×3): qty 2

## 2018-10-07 MED ORDER — ALPRAZOLAM 0.25 MG PO TABS: 0.2500 mg | ORAL_TABLET | Freq: Two times a day (BID) | ORAL | Status: DC | PRN

## 2018-10-07 MED ORDER — LACTATED RINGERS IV BOLUS
1000.0000 mL | Freq: Once | INTRAVENOUS | Status: AC
  Administered 2018-10-07: 1000 mL via INTRAVENOUS

## 2018-10-07 MED ORDER — LACTATED RINGERS IV BOLUS
250.0000 mL | Freq: Once | INTRAVENOUS | Status: AC
  Administered 2018-10-07: 250 mL via INTRAVENOUS

## 2018-10-07 MED ORDER — DEXTROSE-NACL 5-0.45 % IV SOLN
INTRAVENOUS | Status: DC
  Administered 2018-10-07: 13:00:00 via INTRAVENOUS

## 2018-10-07 NOTE — Consult Note (Signed)
Reason for Consult:AKI Referring Physician: Dr. Gerre Scull is an 63 y.o. female.  HPI: 63 yr female with "years of DM and HTN" bought to ED with progressive confusion and lethargy. Found to have [place], and lactic acidosis, now confirmed Enterobacter sepsis, of urinary origin.  Stranding of R kidney on CT. Also Cr of 2.89 and today 3.23.  AG has normalized, but no urine.  On Cefapine for AB.  Cr .85 11/19.  She cannot give hx regarding kidneys and is quite confused.  On ARB and Metformen at home.  Has urine with >50 WBC, 21-50 RBC and >300mg  % protein.  None of these recorded before. Review of systems not obtained due to patient factors.   Past Medical History:  Diagnosis Date  . Diabetes mellitus without complication ([place])   . Hyperlipidemia   . Hypertension   . Hypothyroidism     Past Surgical History:  Procedure Laterality Date  . ABDOMINAL HYSTERECTOMY    . CESAREAN SECTION     2  . CHOLECYSTECTOMY      Family History  Problem Relation Age of Onset  . Diabetes Mother   . Hypertension Mother   . Congestive Heart Failure Mother   . Hypertension Father   . Pneumonia Father   . Pancreatic cancer Brother     Social History:  reports that she has never smoked. She has never used smokeless tobacco. She reports that she does not drink alcohol or use drugs.  Allergies:  Allergies  Allergen Reactions  . Macrobid [Nitrofurantoin Macrocrystal] Rash    Medications:  I have reviewed the patient's current medications. Prior to Admission:  Medications Prior to Admission  Medication Sig Dispense Refill Last Dose  . ALPRAZolam (XANAX) 0.5 MG tablet Take 1 tablet (0.5 mg total) by mouth 2 (two) times daily as needed for anxiety. 60 tablet 0 UNK  . amLODipine (NORVASC) 5 MG tablet TAKE ONE TABLET BY MOUTH DAILY (Patient taking differently: Take 5 mg by mouth daily. ) 90 tablet 2 UNK  . cloNIDine (CATAPRES) 0.2 MG tablet TAKE ONE TABLET BY MOUTH TWICE A DAY (Patient taking  differently: Take 0.2 mg by mouth 2 (two) times daily. ) 90 tablet 0 UNK  . Insulin Glargine (LANTUS) 100 UNIT/ML Solostar Pen Inject 10 Units into the skin daily at 10 pm. (Patient taking differently: Inject 20 Units into the skin daily at 10 pm. ) 15 mL 11 UNK  . Insulin Lispro (HUMALOG KWIKPEN [place]) Inject 10 Units into the skin 2 (two) times a day.    UNK  . Insulin Pen Needle 31G X 6 MM MISC 1 Device by Does not apply route 2 (two) times daily. 60 each 3 UNK  . olmesartan-hydrochlorothiazide (BENICAR HCT) 40-25 MG tablet TAKE 1 TABLET BY MOUTH DAILY 90 tablet 1 UNK  . rosuvastatin (CRESTOR) 40 MG tablet Take 1 tablet (40 mg total) by mouth daily. 90 tablet 3 UNK  . SitaGLIPtin-MetFORMIN HCl 50-1000 MG TB24 Take 2 tablets by mouth daily.    UNK    Results for orders placed or performed during the hospital encounter of 10/06/18 (from the past 48 hour(s))  CBG monitoring, ED     Status: Abnormal   Collection Time: 10/06/18  8:18 AM  Result Value Ref Range   Glucose-Capillary 368 (H) 70 - 99 mg/dL  Lactic acid, plasma     Status: Abnormal   Collection Time: 10/06/18  8:35 AM  Result Value Ref Range   Lactic Acid,  Venous 4.2 (HH) 0.5 - 1.9 mmol/L    Comment: CRITICAL RESULT CALLED TO, READ BACK BY AND VERIFIED WITH: T NORRIS RN [id number] BY A BENNETT Performed at [facility], [place] [address] [place], [place] [address]   Comprehensive metabolic panel     Status: Abnormal   Collection Time: 10/06/18  8:45 AM  Result Value Ref Range   Sodium 129 (L) 135 - 145 mmol/L   Potassium 4.2 3.5 - 5.1 mmol/L   Chloride 99 98 - 111 mmol/L   CO2 15 (L) 22 - 32 mmol/L   Glucose, Bld 389 (H) 70 - 99 mg/dL   BUN 53 (H) 8 - 23 mg/dL   Creatinine, Ser 2.89 (H) 0.44 - 1.00 mg/dL   Calcium 7.4 (L) 8.9 - 10.3 mg/dL   Total Protein 5.7 (L) 6.5 - 8.1 g/dL   Albumin 2.5 (L) 3.5 - 5.0 g/dL   AST 67 (H) 15 - 41 U/L   ALT 87 (H) 0 - 44 U/L   Alkaline Phosphatase 193 (H) 38 - 126 U/L   Total  Bilirubin 1.9 (H) 0.3 - 1.2 mg/dL   GFR calc non Af Amer 17 (L) >60 mL/min   GFR calc Af Amer 19 (L) >60 mL/min   Anion gap 15 5 - 15    Comment: Performed at [facility], [place] [address] [place], [place] [address]  CBC WITH DIFFERENTIAL     Status: Abnormal   Collection Time: 10/06/18  8:45 AM  Result Value Ref Range   WBC 10.3 4.0 - 10.5 K/uL   RBC 4.76 3.87 - 5.11 MIL/uL   Hemoglobin 13.3 12.0 - 15.0 g/dL   HCT 39.1 36.0 - 46.0 %   MCV 82.1 80.0 - 100.0 fL   MCH 27.9 26.0 - 34.0 pg   MCHC 34.0 30.0 - 36.0 g/dL   RDW 14.0 11.5 - 15.5 %   Platelets 65 (L) 150 - 400 K/uL    Comment: REPEATED TO VERIFY PLATELET COUNT CONFIRMED BY SMEAR Immature Platelet Fraction may be clinically indicated, consider ordering this additional test [id number]    nRBC 0.0 0.0 - 0.2 %   Neutrophils Relative % 70 %   Neutro Abs 7.2 1.7 - 7.7 K/uL   Lymphocytes Relative 2 %   Lymphs Abs 0.2 (L) 0.7 - 4.0 K/uL   Monocytes Relative 3 %   Monocytes Absolute 0.3 0.1 - 1.0 K/uL   Eosinophils Relative 4 %   Eosinophils Absolute 0.4 0.0 - 0.5 K/uL   Basophils Relative 0 %   Basophils Absolute 0.0 0.0 - 0.1 K/uL   WBC Morphology DOHLE BODIES     Comment: INCREASED BANDS (>20% BANDS)   Immature Granulocytes 21 %   Abs Immature Granulocytes 2.15 (H) 0.00 - 0.07 K/uL    Comment: Performed at [facility], [address] [place], [place] [address]  Blood Culture (routine x 2)     Status: None (Preliminary result)   Collection Time: 10/06/18  8:45 AM   Specimen: BLOOD  Result Value Ref Range   Specimen Description BLOOD LEFT ANTECUBITAL    Special Requests      BOTTLES DRAWN AEROBIC AND ANAEROBIC Blood Culture adequate volume   Culture  Setup Time      IN BOTH AEROBIC AND ANAEROBIC BOTTLES GRAM NEGATIVE RODS CRITICAL VALUE NOTED.  VALUE IS CONSISTENT WITH PREVIOUSLY REPORTED AND CALLED VALUE. Performed at [facility], [place] [address] [place], [place]  [address]    Culture PENDING     Report Status PENDING   TSH     Status: None   Collection Time: 10/06/18  8:45 AM  Result Value Ref Range   TSH 2.637 0.350 - 4.500 uIU/mL    Comment: Performed by a 3rd Generation assay with a functional sensitivity of <=0.01 uIU/mL. Performed at [facility], [place] [address], [place], [place] [address]   Hemoglobin A1c     Status: Abnormal   Collection Time: 10/06/18  8:45 AM  Result Value Ref Range   Hgb A1c MFr Bld 14.0 (H) 4.8 - 5.6 %    Comment: (NOTE) Pre diabetes:          5.7%-6.4% Diabetes:              >6.4% Glycemic control for   <7.0% adults with diabetes    Mean Plasma Glucose 355.1 mg/dL    Comment: Performed at [facility] [address] [place], [place] [address]  SARS Coronavirus 2     Status: None   Collection Time: 10/06/18  8:49 AM  Result Value Ref Range   SARS Coronavirus 2 NOT DETECTED NOT DETECTED    Comment: (NOTE) SARS-CoV-2 target nucleic acids are NOT DETECTED. The SARS-CoV-2 RNA is generally detectable in upper and lower respiratory specimens during the acute phase of infection.  Negative  results do not preclude SARS-CoV-2 infection, do not rule out co-infections with other pathogens, and should not be used as the sole basis for treatment or other patient management decisions.  Negative results must be combined with clinical observations, patient history, and epidemiological information. The expected result is Not Detected. Fact Sheet for Patients: http://www.biofiredefense.com/wp-content/uploads/2020/03/BIOFIRE-COVID -19-patients.pdf Fact Sheet for Healthcare Providers: http://www.biofiredefense.com/wp-content/uploads/2020/03/BIOFIRE-COVID -19-hcp.pdf This test is not yet approved or cleared by the [place] and  has been authorized for detection and/or diagnosis of SARS-CoV-2 by FDA under an Emergency Use Authorization (EUA).  This EUA will remain in effec t (meaning this test can be used) for the duration of  the  COVID-19 declaration under Section 564(b)(1) of the Act, 21 U.S.C. section 360bbb-3(b)(1), unless the authorization is terminated or revoked sooner. Performed at [facility], [place] [address], [place], [place] [address]   Blood Culture (routine x 2)     Status: None (Preliminary result)   Collection Time: 10/06/18  8:50 AM   Specimen: BLOOD  Result Value Ref Range   Specimen Description BLOOD BLOOD LEFT WRIST    Special Requests      BOTTLES DRAWN AEROBIC AND ANAEROBIC Blood Culture adequate volume   Culture  Setup Time      GRAM NEGATIVE RODS Organism ID to follow CRITICAL RESULT CALLED TO, READ BACK BY AND VERIFIED WITH: PHRMD L SEAY @0318  10/07/18 BY S GEZAHEGN IN BOTH AEROBIC AND ANAEROBIC BOTTLES Performed at [facility], [place] [address] [place], [place] [address]    Culture PENDING    Report Status PENDING   Blood Culture ID Panel (Reflexed)     Status: Abnormal   Collection Time: 10/06/18  8:50 AM  Result Value Ref Range   Enterococcus species NOT DETECTED NOT DETECTED   Listeria monocytogenes NOT DETECTED NOT DETECTED   Staphylococcus species NOT DETECTED NOT DETECTED   Staphylococcus aureus (BCID) NOT DETECTED NOT DETECTED   Streptococcus species NOT DETECTED NOT DETECTED   Streptococcus agalactiae NOT DETECTED NOT DETECTED   Streptococcus pneumoniae NOT DETECTED NOT DETECTED   Streptococcus pyogenes  NOT DETECTED NOT DETECTED   Acinetobacter baumannii NOT DETECTED NOT DETECTED   Enterobacteriaceae species DETECTED (A) NOT DETECTED    Comment: Enterobacteriaceae represent a large family of gram-negative bacteria, not a single organism. CRITICAL RESULT CALLED TO, READ BACK BY AND VERIFIED WITH: PHRMD L SEAY @0318  10/07/18 BY S GEZAHEGN    Enterobacter cloacae complex DETECTED (A) NOT DETECTED    Comment: CRITICAL RESULT CALLED TO, READ BACK BY AND VERIFIED WITH: PHRMD L SEAY @0318  10/07/18 BY S GEZAHEGN    Escherichia coli NOT DETECTED NOT DETECTED    Klebsiella oxytoca NOT DETECTED NOT DETECTED   Klebsiella pneumoniae NOT DETECTED NOT DETECTED   Proteus species NOT DETECTED NOT DETECTED   Serratia marcescens NOT DETECTED NOT DETECTED   Carbapenem resistance NOT DETECTED NOT DETECTED   Haemophilus influenzae NOT DETECTED NOT DETECTED   Neisseria meningitidis NOT DETECTED NOT DETECTED   Pseudomonas aeruginosa NOT DETECTED NOT DETECTED   Candida albicans NOT DETECTED NOT DETECTED   Candida glabrata NOT DETECTED NOT DETECTED   Candida krusei NOT DETECTED NOT DETECTED   Candida parapsilosis NOT DETECTED NOT DETECTED   Candida tropicalis NOT DETECTED NOT DETECTED    Comment: Performed at [facility], [address] [place], [place] [address]  Urinalysis, Routine w reflex microscopic     Status: Abnormal   Collection Time: 10/06/18  9:31 AM  Result Value Ref Range   Color, Urine AMBER (A) YELLOW    Comment: BIOCHEMICALS MAY BE AFFECTED BY COLOR   APPearance TURBID (A) CLEAR   Specific Gravity, Urine 1.015 1.005 - 1.030   pH 5.0 5.0 - 8.0   Glucose, UA >=500 (A) NEGATIVE mg/dL   Hgb urine dipstick MODERATE (A) NEGATIVE   Bilirubin Urine NEGATIVE NEGATIVE   Ketones, ur NEGATIVE NEGATIVE mg/dL   Protein, ur >=300 (A) NEGATIVE mg/dL   Nitrite NEGATIVE NEGATIVE   Leukocytes,Ua MODERATE (A) NEGATIVE   RBC / HPF 21-50 0 - 5 RBC/hpf   WBC, UA >50 (H) 0 - 5 WBC/hpf   Bacteria, UA MANY (A) NONE SEEN   Squamous Epithelial / LPF 6-10 0 - 5   WBC Clumps PRESENT    Mucus PRESENT    Granular Casts, UA PRESENT    Amorphous Crystal PRESENT    Non Squamous Epithelial 0-5 (A) NONE SEEN    Comment: Performed at [facility], [place] [address] [place], [place] [address]  Urine culture     Status: Abnormal (Preliminary result)   Collection Time: 10/06/18 10:10 AM   Specimen: Urine, Random  Result Value Ref Range   Specimen Description URINE, RANDOM    Special Requests Immunocompromised    Culture (A)     >=100,000 COLONIES/mL GRAM  NEGATIVE RODS IDENTIFICATION AND SUSCEPTIBILITIES TO FOLLOW Performed at [facility], [place] [address], [place], [place] [address]    Report Status PENDING   Lactic acid, plasma     Status: Abnormal   Collection Time: 10/06/18 11:04 AM  Result Value Ref Range   Lactic Acid, Venous 4.0 (HH) 0.5 - 1.9 mmol/L    Comment: CRITICAL RESULT CALLED TO, READ BACK BY AND VERIFIED WITH: T NORRIS RN [id number] BY A BENNETT Performed at [facility], [place] [address], [place], [place] [address]   POCT I-Stat EG7     Status: Abnormal   Collection Time: 10/06/18 11:15 AM  Result Value Ref Range   pH, Ven 7.323 7.250 - 7.430   pCO2, Ven  33.4 (L) 44.0 - 60.0 mmHg   pO2, Ven 36.0 32.0 - 45.0 mmHg   Bicarbonate 17.4 (L) 20.0 - 28.0 mmol/L   TCO2 18 (L) 22 - 32 mmol/L   O2 Saturation 65.0 %   Acid-base deficit 8.0 (H) 0.0 - 2.0 mmol/L   Sodium 132 (L) 135 - 145 mmol/L   Potassium 4.1 3.5 - 5.1 mmol/L   Calcium, Ion 0.94 (L) 1.15 - 1.40 mmol/L   HCT 38.0 36.0 - 46.0 %   Hemoglobin 12.9 12.0 - 15.0 g/dL   Patient temperature HIDE    Sample type VENOUS    Comment NOTIFIED PHYSICIAN   CBG monitoring, ED     Status: Abnormal   Collection Time: 10/06/18 11:17 AM  Result Value Ref Range   Glucose-Capillary 379 (H) 70 - 99 mg/dL  CBG monitoring, ED     Status: Abnormal   Collection Time: 10/06/18 12:34 PM  Result Value Ref Range   Glucose-Capillary 330 (H) 70 - 99 mg/dL   Comment 1 Notify RN    Comment 2 Document in Chart   CBG monitoring, ED     Status: Abnormal   Collection Time: 10/06/18  1:21 PM  Result Value Ref Range   Glucose-Capillary 346 (H) 70 - 99 mg/dL  CBG monitoring, ED     Status: Abnormal   Collection Time: 10/06/18  2:55 PM  Result Value Ref Range   Glucose-Capillary 280 (H) 70 - 99 mg/dL  Glucose, capillary     Status: Abnormal   Collection Time: 10/06/18  3:56 PM  Result Value Ref Range   Glucose-Capillary 228 (H) 70 - 99 mg/dL  Glucose, capillary     Status:  Abnormal   Collection Time: 10/06/18  5:09 PM  Result Value Ref Range   Glucose-Capillary 243 (H) 70 - 99 mg/dL  Glucose, capillary     Status: Abnormal   Collection Time: 10/06/18  6:09 PM  Result Value Ref Range   Glucose-Capillary 233 (H) 70 - 99 mg/dL  MRSA PCR Screening     Status: None   Collection Time: 10/06/18  6:11 PM   Specimen: Nasal Mucosa; Nasopharyngeal  Result Value Ref Range   MRSA by PCR NEGATIVE NEGATIVE    Comment:        The GeneXpert MRSA Assay (FDA approved for NASAL specimens only), is one component of a comprehensive MRSA colonization surveillance program. It is not intended to diagnose MRSA infection nor to guide or monitor treatment for MRSA infections. Performed at [facility], [place] [address] [place], [place] [address]   Glucose, capillary     Status: Abnormal   Collection Time: 10/06/18  6:51 PM  Result Value Ref Range   Glucose-Capillary 226 (H) 70 - 99 mg/dL  Glucose, capillary     Status: Abnormal   Collection Time: 10/06/18  7:55 PM  Result Value Ref Range   Glucose-Capillary 218 (H) 70 - 99 mg/dL  Glucose, capillary     Status: Abnormal   Collection Time: 10/06/18  8:56 PM  Result Value Ref Range   Glucose-Capillary 184 (H) 70 - 99 mg/dL  Glucose, capillary     Status: Abnormal   Collection Time: 10/06/18 10:00 PM  Result Value Ref Range   Glucose-Capillary 173 (H) 70 - 99 mg/dL  Glucose, capillary     Status: Abnormal   Collection Time: 10/06/18 11:01 PM  Result Value Ref Range   Glucose-Capillary 134 (H) 70 - 99 mg/dL  Glucose, capillary  Status: Abnormal   Collection Time: 10/07/18 12:04 AM  Result Value Ref Range   Glucose-Capillary 119 (H) 70 - 99 mg/dL  Glucose, capillary     Status: Abnormal   Collection Time: 10/07/18  1:00 AM  Result Value Ref Range   Glucose-Capillary 122 (H) 70 - 99 mg/dL  Glucose, capillary     Status: Abnormal   Collection Time: 10/07/18  2:01 AM  Result Value Ref Range    Glucose-Capillary 134 (H) 70 - 99 mg/dL  Glucose, capillary     Status: Abnormal   Collection Time: 10/07/18  3:02 AM  Result Value Ref Range   Glucose-Capillary 133 (H) 70 - 99 mg/dL  Glucose, capillary     Status: Abnormal   Collection Time: 10/07/18  4:28 AM  Result Value Ref Range   Glucose-Capillary 149 (H) 70 - 99 mg/dL  Glucose, capillary     Status: Abnormal   Collection Time: 10/07/18  5:37 AM  Result Value Ref Range   Glucose-Capillary 156 (H) 70 - 99 mg/dL  Glucose, capillary     Status: Abnormal   Collection Time: 10/07/18  6:41 AM  Result Value Ref Range   Glucose-Capillary 168 (H) 70 - 99 mg/dL  CBC     Status: Abnormal   Collection Time: 10/07/18  7:19 AM  Result Value Ref Range   WBC 8.8 4.0 - 10.5 K/uL   RBC 4.04 3.87 - 5.11 MIL/uL   Hemoglobin 11.3 (L) 12.0 - 15.0 g/dL   HCT 32.7 (L) 36.0 - 46.0 %   MCV 80.9 80.0 - 100.0 fL   MCH 28.0 26.0 - 34.0 pg   MCHC 34.6 30.0 - 36.0 g/dL   RDW 14.1 11.5 - 15.5 %   Platelets 36 (L) 150 - 400 K/uL    Comment: REPEATED TO VERIFY PLATELET COUNT CONFIRMED BY SMEAR Immature Platelet Fraction may be clinically indicated, consider ordering this additional test [id number]    nRBC 0.0 0.0 - 0.2 %    Comment: Performed at [facility], [place]. [address], [place], [place] [address]  Basic metabolic panel     Status: Abnormal   Collection Time: 10/07/18  7:19 AM  Result Value Ref Range   Sodium 132 (L) 135 - 145 mmol/L   Potassium 3.5 3.5 - 5.1 mmol/L   Chloride 98 98 - 111 mmol/L   CO2 22 22 - 32 mmol/L   Glucose, Bld 180 (H) 70 - 99 mg/dL   BUN 53 (H) 8 - 23 mg/dL   Creatinine, Ser 3.23 (H) 0.44 - 1.00 mg/dL   Calcium 6.6 (L) 8.9 - 10.3 mg/dL   GFR calc non Af Amer 15 (L) >60 mL/min   GFR calc Af Amer 17 (L) >60 mL/min   Anion gap 12 5 - 15    Comment: Performed at [facility] [address] [place], [place] [address]  Hepatic function panel     Status: Abnormal   Collection Time: 10/07/18  7:19 AM   Result Value Ref Range   Total Protein 5.0 (L) 6.5 - 8.1 g/dL   Albumin 2.0 (L) 3.5 - 5.0 g/dL   AST 83 (H) 15 - 41 U/L   ALT 67 (H) 0 - 44 U/L   Alkaline Phosphatase 243 (H) 38 - 126 U/L   Total Bilirubin 3.2 (H) 0.3 - 1.2 mg/dL   Bilirubin, Direct 2.0 (H) 0.0 - 0.2 mg/dL   Indirect Bilirubin 1.2 (H) 0.3 - 0.9 mg/dL  Comment: Performed at [facility], [place] [address], [place], [place] [address]  Magnesium     Status: Abnormal   Collection Time: 10/07/18  7:19 AM  Result Value Ref Range   Magnesium 1.6 (L) 1.7 - 2.4 mg/dL    Comment: Performed at [facility] [address] [place], [place] [address]  Phosphorus     Status: Abnormal   Collection Time: 10/07/18  7:19 AM  Result Value Ref Range   Phosphorus 2.1 (L) 2.5 - 4.6 mg/dL    Comment: Performed at [facility] [address] [place], [place] [address]  Glucose, capillary     Status: Abnormal   Collection Time: 10/07/18  7:42 AM  Result Value Ref Range   Glucose-Capillary 170 (H) 70 - 99 mg/dL  Glucose, capillary     Status: Abnormal   Collection Time: 10/07/18  8:48 AM  Result Value Ref Range   Glucose-Capillary 173 (H) 70 - 99 mg/dL  Glucose, capillary     Status: Abnormal   Collection Time: 10/07/18 11:30 AM  Result Value Ref Range   Glucose-Capillary 179 (H) 70 - 99 mg/dL    Ct Abdomen Pelvis Wo Contrast  Result Date: 10/06/2018 CLINICAL DATA:  Altered mental status, nausea, vomiting and fevers since 10/05/2018. EXAM: CT ABDOMEN AND PELVIS WITHOUT CONTRAST TECHNIQUE: Multidetector CT imaging of the abdomen and pelvis was performed following the standard protocol without IV contrast. COMPARISON:  CT pelvis 07/04/2017. FINDINGS: Lower chest: There is cardiomegaly. Tiny right pleural effusion is noted. Minimal dependent atelectasis is present. Hepatobiliary: The liver is diffusely low attenuating and measures 20 cm craniocaudal. No focal liver lesion. Status post cholecystectomy. Biliary tree is  unremarkable. Pancreas: Unremarkable. No pancreatic ductal dilatation or surrounding inflammatory changes. Spleen: Normal in size without focal abnormality. Adrenals/Urinary Tract: The adrenal glands appear normal. The right kidney appears edematous relative to the left. There is stranding about the right kidney and ureter. No stone is identified. Small phlebolith over the right psoas is unchanged compared to the prior CT. 0.4 cm nonobstructing stone left kidney noted. Urinary bladder appears normal. Stomach/Bowel: Stomach is within normal limits. Appendix appears normal. The appendix is not visualized but no evidence of appendicitis is seen. No evidence of bowel wall thickening, distention, or inflammatory changes. Vascular/Lymphatic: No significant vascular findings are present. No enlarged abdominal or pelvic lymph nodes. Reproductive: Status post hysterectomy. No adnexal masses. Other: There is a small volume of free pelvic fluid. Trace amount of fluid off the inferior margin of the liver is also identified. No focal fluid collection. Musculoskeletal: No fracture or focal bony lesion. Trace anterolisthesis L4 on L5 due to facet arthropathy noted. IMPRESSION: The right kidney appears edematous with stranding about the kidney and ureter most worrisome for pyelonephritis and possibly ureteritis. Negative for right urinary tract stone. 0.4 cm nonobstructing stone left kidney. Hepatomegaly and diffuse fatty infiltration of the liver. Cardiomegaly. Trace right pleural effusion. Electronically Signed   By: Inge Rise M.D.   On: 10/06/2018 15:44   Ct Head Wo Contrast  Result Date: 10/06/2018 CLINICAL DATA:  Nausea, vomiting and fever for 4 days. Confusion since 12 noon yesterday. EXAM: CT HEAD WITHOUT CONTRAST TECHNIQUE: Contiguous axial images were obtained from the base of the skull through the vertex without intravenous contrast. COMPARISON:  None. FINDINGS: Brain: No evidence of acute infarction,  hemorrhage, hydrocephalus, extra-axial collection or mass lesion/mass effect. Vascular: No hyperdense vessel or unexpected calcification. Skull: Intact.  No focal  lesion. Sinuses/Orbits: Status post cataract surgery.  Otherwise negative. Other: None. IMPRESSION: Negative head CT. Electronically Signed   By: Inge Rise M.D.   On: 10/06/2018 11:14   Dg Chest Port 1 View  Result Date: 10/06/2018 CLINICAL DATA:  Fever, tachypnea, history hypertension, diabetes mellitus EXAM: PORTABLE CHEST 1 VIEW COMPARISON:  Portable exam 0903 hours compared to 07/03/2017 FINDINGS: Borderline of cardiac silhouette, likely accentuated by low lung volumes and lordotic technique. Mediastinal contours and pulmonary vascularity normal. Hypoinflated lungs without infiltrate, pleural effusion or pneumothorax. Bones demineralized. IMPRESSION: No acute abnormalities. Electronically Signed   By: Lavonia Dana M.D.   On: 10/06/2018 09:39    ROS Blood pressure (!) 164/86, pulse (!) 133, temperature 99.8 F (37.7 C), temperature source Oral, resp. rate (!) 35, height 4' 11.5" (1.511 m), weight 77.5 kg, SpO2 96 %. Physical Exam Physical Examination: General appearance - Pale, , obese , eye closed, and open only transiently then falls asleep Mental status - confused, as above Eyes - pupils equal and reactive, extraocular eye movements intact Mouth - reddened,  Neck - adenopathy noted PCL Lymphatics - posterior cervical nodes Chest - rales noted bibasilar Heart - S1 and S2 normal, rate 120-140 reg Abdomen - obese, pos bs Neurological - moves all extrem to stim, EOMs intact, sym facies Extremities - pedal edema tr-1+ + Skin - pale, hemangiomata  Assessment/Plan: 1 AKI  Sepsis in setting of ABR and Metformen  Most likely.  Low platelets worrisome but probable sepsis.  No urine and vol replete . Acidemia resolving.  Need to montor for obstruct.  Do not want to place foley. Proteinuria needs to be watched, may be secondary to  pyelo 2 DM poor control 3 Hypertension: rising , do not tx at this time 4. Anemia evolving, check smear, LDH 5. Pyelo with sepsis on AB 6 Obesity 7 Confusion ?? Just sepsis 8 Low ptlt ? Sepsis vs other P AB, hold ivf now ,follow AG, U/S, follow ptlt, check LDH, hapto  Jennet Scroggin 10/07/2018, 1:20 PM

## 2018-10-07 NOTE — Progress Notes (Signed)
Rehab Admissions Coordinator Note:  Patient was screened by Cleatrice Burke for appropriateness for an Inpatient Acute Rehab Consult per PT recommendations.   At this time, we are recommending Inpatient Rehab consult. Please place order for consult.  Cleatrice Burke RN MSN 10/07/2018, 1:51 PM  I can be reached at [phone].

## 2018-10-07 NOTE — Progress Notes (Signed)
PHARMACY - PHYSICIAN COMMUNICATION CRITICAL VALUE ALERT - BLOOD CULTURE IDENTIFICATION (BCID)  Allison Patel is an 63 y.o. female who presented to [facility] on 10/06/2018 with a chief complaint of sepsis  Assessment:  2/4 BC positive for enterobacter  Name of physician (or Provider) Contacted: S Kamat  Current antibiotics: rocephin  Changes to prescribed antibiotics recommended:  Change back to cefepime  Results for orders placed or performed during the hospital encounter of 10/06/18  Blood Culture ID Panel (Reflexed) (Collected: 10/06/2018  8:50 AM)  Result Value Ref Range   Enterococcus species NOT DETECTED NOT DETECTED   Listeria monocytogenes NOT DETECTED NOT DETECTED   Staphylococcus species NOT DETECTED NOT DETECTED   Staphylococcus aureus (BCID) NOT DETECTED NOT DETECTED   Streptococcus species NOT DETECTED NOT DETECTED   Streptococcus agalactiae NOT DETECTED NOT DETECTED   Streptococcus pneumoniae NOT DETECTED NOT DETECTED   Streptococcus pyogenes NOT DETECTED NOT DETECTED   Acinetobacter baumannii NOT DETECTED NOT DETECTED   Enterobacteriaceae species DETECTED (A) NOT DETECTED   Enterobacter cloacae complex DETECTED (A) NOT DETECTED   Escherichia coli NOT DETECTED NOT DETECTED   Klebsiella oxytoca NOT DETECTED NOT DETECTED   Klebsiella pneumoniae NOT DETECTED NOT DETECTED   Proteus species NOT DETECTED NOT DETECTED   Serratia marcescens NOT DETECTED NOT DETECTED   Carbapenem resistance NOT DETECTED NOT DETECTED   Haemophilus influenzae NOT DETECTED NOT DETECTED   Neisseria meningitidis NOT DETECTED NOT DETECTED   Pseudomonas aeruginosa NOT DETECTED NOT DETECTED   Candida albicans NOT DETECTED NOT DETECTED   Candida glabrata NOT DETECTED NOT DETECTED   Candida krusei NOT DETECTED NOT DETECTED   Candida parapsilosis NOT DETECTED NOT DETECTED   Candida tropicalis NOT DETECTED NOT DETECTED    [name] 10/07/2018  3:29 AM

## 2018-10-07 NOTE — Progress Notes (Addendum)
NAME:  Allison Patel, MRN:  [id number], DOB:  1955-07-17, LOS: 1 ADMISSION DATE:  10/06/2018, CONSULTATION DATE:  10/06/18 REFERRING MD:  ER, CHIEF COMPLAINT:  confusion   Brief History   63 year old woman with hx DM, HTN p/w severe sepsis secondary to enterobacter pyelnonephritis with hematogenous spread.  History of present illness   63 year old woman with DM, HTN, prior gluteal abscess presenting with confusion since Sunday ~3 days.  Initially got better with PO fluids but got worse last night into day of admission.  Admission labs notable for +UA, actidosis, acute kidney injury, and lactic acidemia.  Lactate and mental status did not improve with fluids and abx in ER so intensivist asked to admit.  Past Medical History  DM2, A1c > 14 06/26/17 HTN Anxiety/depression [facility] Events   6/16 admission  Consults:  None  Procedures:  None  Significant Diagnostic Tests:  CT head 6/16 no acute issues CT A/P 6/16 pyelo  Micro Data:  Urine culture 6/16>>>GNR Blood cultures 6/16>>>enterobacter Covid 19 Neg HIV neg 2019  Antimicrobials:  Vanc/cefepime/flagyl x 1 in ER Ceftriaxone 10/06/18>>10/07/18 Cefepime 6/17>>>  Interim history/subjective:  Feels a little better today. Still having a lot of word finding difficulty. Enterobacter in blood, switched to cefepime.  Objective   Blood pressure (!) 146/116, pulse (!) 130, temperature 100 F (37.8 C), temperature source Oral, resp. rate (!) 37, height 4' 11.5" (1.511 m), weight 77.5 kg, SpO2 96 %.        Intake/Output Summary (Last 24 hours) at 10/07/2018 0931 Last data filed at 10/07/2018 0900 Gross per 24 hour  Intake 6201.16 ml  Output -  Net 6201.16 ml   Filed Weights   10/06/18 0820 10/07/18 0500  Weight: 73 kg 77.5 kg    Examination: General: 63 year old woman in NAD HENT: MM dry, poor dentition Lungs: Clear, less tachypneictoday Cardiovascular: Tachycardic, regular, ext warm, +SEM Abdomen: Soft,  +BS Extremities: No edema Neuro: Follow commands, still with lots of word finding difficult Skin: no rashes  Resolved Hospital Problem list   NA  Assessment & Plan:  # Severe sepsis secondary to enterobacter pyelonephritis: improved subjectively today but renal function worse and encephalopathy persistent # Acute kidney injury related to poor PO and infection, with worsening overnight and oliguria I think we are progressing to ATN unfortunately # Tachycardia reactive to fevers and sepsis, unchanged from prior admission, RBBB sinus tach, still present would not treat unless Bps dropping or symptomatic # Hyperglycemia without ketones in urine now transitioned to levemir with SSI # Word finding difficult initially suspected related to sepsis; it's a bit better today  # Transaminitis and elevated Alk phos with thrombocytopenia, suspicion for developing NASH with superimposed sepsis-induced depression.   # Heart murmur  - Continue cefepime, anticipate 14 days therapy - Okay to stop bicarb gtt - Abd [place], trend LFT/plts - Another LR bolus 1L, still appears dry, liberalize diet - Start LR at 150cc/hr, monitor FEN and UoP, going to ask nephrology to take a look at her I am hopeful things will pick back up but at high risk for needing HD at some point - Word finding difficult improved but still notable, if persistent probably warrants MRI - Regarding tachycardia, seems to run 80s-90s at her office visits, will see if boluses help, still suspect reactive - Levemir, SSI and start diabetic diet - PT/OT consults - Okay to go to progressive care w tele, appreciate hospitalists taking over tomorrow  Best  practice:  Diet: Diabetic renal diet Pain/Anxiety/Delirium protocol (if indicated): restart home benzodiazepines at half dose, ?contributing to tachycardia VAP protocol (if indicated): NA DVT prophylaxis: SCDs given thrombocytopenia GI prophylaxis: Not indicated at this time Glucose control: levemir  and SSI Mobility: up to chair, PT consult Code Status: full Family Communication: updated daughter today Disposition: progressive care with tele   Erskine Emery MD [facility] 10/06/18 9:31 AM Personal pager: [phone] If unanswered, please page CCM On-call: [phone]

## 2018-10-07 NOTE — Evaluation (Signed)
Occupational Therapy Evaluation Patient Details Name: Allison Patel MRN: [id number] DOB: [birth year] Today's Date: 10/07/2018    History of Present Illness 63 year old woman with DM, HTN, prior gluteal abscess presenting with confusion since Sunday ~3 days. hx DM, HTN p/w severe sepsis secondary to enterobacter pyelnonephritis with hematogenous spread.   Clinical Impression   Pt was independent prior to admission. Presents with impaired cognition, generalized weakness and decreased standing balance requiring min assist for transfers and to take a few steps to chair. Pt needs min to total assist for ADL. Pt with HR to 143 with activity and utilizing 2L 02. Pt likely to progress well with intensive rehab in CIR. Will follow acutely.    Follow Up Recommendations  CIR    Equipment Recommendations  3 in 1 bedside commode    Recommendations for Other Services       Precautions / Restrictions Precautions Precautions: Fall Precaution Comments: incontinent of bowel Restrictions Weight Bearing Restrictions: No      Mobility Bed Mobility Overal bed mobility: Needs Assistance Bed Mobility: Rolling;Supine to Sit Rolling: Min guard   Supine to sit: Min assist     General bed mobility comments: A little assist to get started - to move LEs off bed and for elevation of trunk.   Transfers Overall transfer level: Needs assistance Equipment used: 1 person hand held assist Transfers: Sit to/from [facility] Sit to Stand: Min assist;+2 safety/equipment Stand pivot transfers: Min assist;+2 safety/equipment       General transfer comment: Pt was able to stand with min assist to power up and pivot to 3N1.  Then took a few pivotal steps to recliner. Slightly unsteady on feet.     Balance Overall balance assessment: Needs assistance Sitting-balance support: No upper extremity supported;Feet supported Sitting balance-Leahy Scale: Fair     Standing balance support:  Bilateral upper extremity supported;During functional activity Standing balance-Leahy Scale: Poor Standing balance comment: Pt was able to stand with bil UE support needing steadying assist.                            ADL either performed or assessed with clinical judgement   ADL Overall ADL's : Needs assistance/impaired Eating/Feeding: Minimal assistance;Sitting   Grooming: Minimal assistance;Maximal assistance;Sitting;Brushing hair;Wash/dry face;Wash/dry hands   Upper Body Bathing: Moderate assistance;Sitting   Lower Body Bathing: Maximal assistance;Sit to/from stand   Upper Body Dressing : Moderate assistance;Sitting   Lower Body Dressing: Maximal assistance;Sit to/from stand   Toilet Transfer: Minimal assistance;Stand-pivot;BSC   Toileting- Clothing Manipulation and Hygiene: Total assistance;Sit to/from stand               Vision Patient Visual Report: No change from baseline       Perception     Praxis      Pertinent Vitals/Pain Pain Assessment: No/denies pain     Hand Dominance Right   Extremity/Trunk Assessment Upper Extremity Assessment Upper Extremity Assessment: Generalized weakness   Lower Extremity Assessment Lower Extremity Assessment: Defer to PT evaluation   Cervical / Trunk Assessment Cervical / Trunk Assessment: Kyphotic   Communication Communication Communication: Other (comment)(non sensical conversation at times, can answer basic questio)   Cognition Arousal/Alertness: Awake/alert Behavior During Therapy: Flat affect Overall Cognitive Status: Impaired/Different from baseline Area of Impairment: Orientation;Following commands;Awareness;Safety/judgement;Problem solving;Memory                 Orientation Level: Disoriented to;Place;Time;Situation(pt able to choose correctly from 2  options )   Memory: Decreased short-term memory Following Commands: Follows one step commands inconsistently;Follows one step commands with  increased time Safety/Judgement: Decreased awareness of safety;Decreased awareness of deficits   Problem Solving: Slow processing;Decreased initiation;Difficulty sequencing;Requires verbal cues;Requires tactile cues General Comments: eyes closed frequently during session, slow response speed   General Comments       Exercises     Shoulder Instructions      Home Living Family/patient expects to be discharged to:: Private residence Living Arrangements: Spouse/significant other Available Help at Discharge: Family;Available 24 hours/day(husband) Type of Home: House Home Access: Level entry     Home Layout: One level     Bathroom Shower/Tub: Occupational psychologist: Handicapped height     Home Equipment: None          Prior Functioning/Environment Level of Independence: Independent                 OT Problem List: Decreased strength;Decreased activity tolerance;Impaired balance (sitting and/or standing);Decreased cognition;Decreased knowledge of use of DME or AE      OT Treatment/Interventions: Self-care/ADL training;DME and/or AE instruction;Patient/family education;Balance training;Cognitive remediation/compensation;Therapeutic activities    OT Goals(Current goals can be found in the care plan section) Acute Rehab OT Goals Patient Stated Goal: to go home OT Goal Formulation: With patient Time For Goal Achievement: 10/21/18 Potential to Achieve Goals: Good ADL Goals Pt Will Perform Grooming: with supervision;standing Pt Will Perform Upper Body Dressing: with set-up;with supervision;sitting Pt Will Perform Lower Body Dressing: with set-up;with supervision;sit to/from stand Pt Will Transfer to Toilet: with supervision;ambulating;bedside commode Pt Will Perform Toileting - Clothing Manipulation and hygiene: with supervision;sit to/from stand Additional ADL Goal #1: Pt will identify and gather items necessary for ADL around her room with supervision and AD  as needed.  OT Frequency: Min 2X/week   Barriers to D/C:            Co-evaluation PT/OT/SLP Co-Evaluation/Treatment: Yes Reason for Co-Treatment: For patient/therapist safety PT goals addressed during session: Mobility/safety with mobility OT goals addressed during session: ADL's and self-care      AM-PAC OT "6 Clicks" Daily Activity     Outcome Measure Help from another person eating meals?: A Little Help from another person taking care of personal grooming?: A Lot Help from another person toileting, which includes using toliet, bedpan, or urinal?: Total Help from another person bathing (including washing, rinsing, drying)?: A Lot Help from another person to put on and taking off regular upper body clothing?: A Lot Help from another person to put on and taking off regular lower body clothing?: Total 6 Click Score: 11   End of Session Equipment Utilized During Treatment: Gait belt Nurse Communication: Mobility status;Other (comment)(aware of BM)  Activity Tolerance: Patient tolerated treatment well Patient left: in chair;with call bell/phone within reach;with chair alarm set  OT Visit Diagnosis: Unsteadiness on feet (R26.81);Other symptoms and signs involving cognitive function;Muscle weakness (generalized) (M62.81)                Time: 4132-4401 OT Time Calculation (min): 20 min Charges:  OT General Charges $OT Visit: 1 Visit OT Evaluation $OT Eval Moderate Complexity: 1 Mod  Nestor Lewandowsky, OTR/L Acute Rehabilitation Services Pager: [phone] Office: [phone]  Allison Patel 10/07/2018, 1:59 PM

## 2018-10-07 NOTE — Progress Notes (Signed)
eLink Physician-Brief Progress Note Patient Name: Allison Patel DOB: 04-Mar-1956 MRN: [id number]   Date of Service  10/07/2018  HPI/Events of Note  Oliguria  eICU Interventions  LR bolus x 1     Intervention Category Intermediate Interventions: Oliguria - evaluation and management  Allison Patel 10/07/2018, 2:27 AM

## 2018-10-07 NOTE — Progress Notes (Signed)
eLink Physician-Brief Progress Note Patient Name: Allison Patel DOB: July 14, 1955 MRN: [id number]   Date of Service  10/07/2018  HPI/Events of Note  Notified by pharmacy of blood culture + for enterobacter. On rocephin. Pharmacist recommended switch to cefepime given hospital data  eICU Interventions  Cefepime to be re-ordered     Intervention Category Intermediate Interventions: Infection - evaluation and management  Deangleo Passage G Maggie Senseney 10/07/2018, 3:27 AM

## 2018-10-07 NOTE — Evaluation (Addendum)
Physical Therapy Evaluation Patient Details Name: Allison Patel MRN: [id number] DOB: 01-25-1956 Today's Date: 10/07/2018   History of Present Illness  63 year old woman with DM, HTN, prior gluteal abscess presenting with confusion since Sunday ~3 days. hx DM, HTN p/w severe sepsis secondary to enterobacter pyelnonephritis with hematogenous spread.  Clinical Impression  Pt admitted with above diagnosis. Pt currently with functional limitations due to the deficits listed below (see PT Problem List). Pt was able to ambulate a few steps to the recliner from the 3N1 with +2 min HHA for support.  Pt slightly unsteady and overall was limited by confusion as well.  VSS except HR 127-143 bpm with activity.  On 2LO2.  May need CIR if confusion and instability persist.   Pt will benefit from skilled PT to increase their independence and safety with mobility to allow discharge to the venue listed below.      Follow Up Recommendations CIR;Supervision/Assistance - 24 hour    Equipment Recommendations  Rolling walker with 5" wheels;3in1 (PT)    Recommendations for Other Services       Precautions / Restrictions Precautions Precautions: Fall Restrictions Weight Bearing Restrictions: No      Mobility  Bed Mobility Overal bed mobility: Needs Assistance Bed Mobility: Rolling;Supine to Sit Rolling: Min guard   Supine to sit: Min assist     General bed mobility comments: A little assist to get started - to move LEs off bed and for elevation of trunk.   Transfers Overall transfer level: Needs assistance Equipment used: 2 person hand held assist Transfers: Sit to/from [facility] Sit to Stand: Min assist;+2 safety/equipment Stand pivot transfers: Min assist;+2 safety/equipment       General transfer comment: Pt was able to stand with min assist to power up and pivot to 3N1.  Then took a few pivotal steps to recliner. Slightly unsteady on feet.   Ambulation/Gait                 Stairs            Wheelchair Mobility    Modified Rankin (Stroke Patients Only)       Balance Overall balance assessment: Needs assistance Sitting-balance support: No upper extremity supported;Feet supported Sitting balance-Leahy Scale: Fair     Standing balance support: Bilateral upper extremity supported;During functional activity Standing balance-Leahy Scale: Poor Standing balance comment: Pt was able to stand with bil UE support needing steadying assist.                              Pertinent Vitals/Pain Pain Assessment: No/denies pain    Home Living Family/patient expects to be discharged to:: Private residence Living Arrangements: Spouse/significant other Available Help at Discharge: Family;Available 24 hours/day(husband) Type of Home: House Home Access: Level entry     Home Layout: One level Home Equipment: None      Prior Function Level of Independence: Independent               Hand Dominance   Dominant Hand: Right    Extremity/Trunk Assessment   Upper Extremity Assessment Upper Extremity Assessment: Defer to OT evaluation    Lower Extremity Assessment Lower Extremity Assessment: Generalized weakness    Cervical / Trunk Assessment Cervical / Trunk Assessment: Kyphotic  Communication   Communication: No difficulties  Cognition Arousal/Alertness: Awake/alert Behavior During Therapy: Flat affect Overall Cognitive Status: Impaired/Different from baseline Area of Impairment: Orientation;Following commands;Awareness;Safety/judgement;Problem solving;Memory  Orientation Level: Disoriented to;Place;Time;Situation(had to give pt options for her to say hospital, June, etc)   Memory: Decreased short-term memory Following Commands: Follows one step commands inconsistently;Follows one step commands with increased time Safety/Judgement: Decreased awareness of safety;Decreased awareness of deficits    Problem Solving: Slow processing;Decreased initiation;Difficulty sequencing;Requires verbal cues;Requires tactile cues        General Comments      Exercises     Assessment/Plan    PT Assessment Patient needs continued PT services  PT Problem List Decreased activity tolerance;Decreased balance;Decreased mobility;Decreased knowledge of use of DME;Decreased safety awareness;Decreased knowledge of precautions       PT Treatment Interventions DME instruction;Gait training;Functional mobility training;Therapeutic activities;Therapeutic exercise;Balance training;Patient/family education    PT Goals (Current goals can be found in the Care Plan section)  Acute Rehab PT Goals Patient Stated Goal: to go home PT Goal Formulation: With patient Time For Goal Achievement: 10/21/18 Potential to Achieve Goals: Good    Frequency Min 3X/week   Barriers to discharge Decreased caregiver support      Co-evaluation PT/OT/SLP Co-Evaluation/Treatment: Yes Reason for Co-Treatment: For patient/therapist safety PT goals addressed during session: Mobility/safety with mobility         AM-PAC PT "6 Clicks" Mobility  Outcome Measure Help needed turning from your back to your side while in a flat bed without using bedrails?: A Little Help needed moving from lying on your back to sitting on the side of a flat bed without using bedrails?: A Little Help needed moving to and from a bed to a chair (including a wheelchair)?: A Little Help needed standing up from a chair using your arms (e.g., wheelchair or bedside chair)?: A Little Help needed to walk in hospital room?: A Little Help needed climbing 3-5 steps with a railing? : A Lot 6 Click Score: 17    End of Session Equipment Utilized During Treatment: Gait belt;Oxygen Activity Tolerance: Patient limited by fatigue Patient left: in chair;with call bell/phone within reach;with chair alarm set Nurse Communication: Mobility status PT Visit  Diagnosis: Muscle weakness (generalized) (M62.81);Unsteadiness on feet (R26.81)    Time: 1047-1106 PT Time Calculation (min) (ACUTE ONLY): 19 min   Charges:   PT Evaluation $PT Eval Moderate Complexity: [place] Pager:  [phone]  Office:  [phone]    Denice Paradise 10/07/2018, 1:23 PM

## 2018-10-08 ENCOUNTER — Telehealth: Payer: Self-pay | Admitting: Internal Medicine

## 2018-10-08 DIAGNOSIS — I10 Essential (primary) hypertension: Secondary | ICD-10-CM

## 2018-10-08 DIAGNOSIS — E039 Hypothyroidism, unspecified: Secondary | ICD-10-CM

## 2018-10-08 DIAGNOSIS — E7800 Pure hypercholesterolemia, unspecified: Secondary | ICD-10-CM

## 2018-10-08 LAB — RENAL FUNCTION PANEL
Albumin: 1.9 g/dL — ABNORMAL LOW (ref 3.5–5.0)
Anion gap: 13 (ref 5–15)
BUN: 57 mg/dL — ABNORMAL HIGH (ref 8–23)
CO2: 22 mmol/L (ref 22–32)
Calcium: 6.9 mg/dL — ABNORMAL LOW (ref 8.9–10.3)
Chloride: 98 mmol/L (ref 98–111)
Creatinine, Ser: 3.71 mg/dL — ABNORMAL HIGH (ref 0.44–1.00)
GFR calc Af Amer: 14 mL/min — ABNORMAL LOW (ref >60)
GFR calc non Af Amer: 12 mL/min — ABNORMAL LOW (ref >60)
Glucose, Bld: 100 mg/dL — ABNORMAL HIGH (ref 70–99)
Phosphorus: 3.2 mg/dL (ref 2.5–4.6)
Potassium: 3.3 mmol/L — ABNORMAL LOW (ref 3.5–5.1)
Sodium: 133 mmol/L — ABNORMAL LOW (ref 135–145)

## 2018-10-08 LAB — FERRITIN: Ferritin: 740 ng/mL — ABNORMAL HIGH (ref 11–307)

## 2018-10-08 LAB — HEPATIC FUNCTION PANEL
ALT: 61 U/L — ABNORMAL HIGH (ref 0–44)
AST: 85 U/L — ABNORMAL HIGH (ref 15–41)
Albumin: 1.8 g/dL — ABNORMAL LOW (ref 3.5–5.0)
Alkaline Phosphatase: 248 U/L — ABNORMAL HIGH (ref 38–126)
Bilirubin, Direct: 2.2 mg/dL — ABNORMAL HIGH (ref 0.0–0.2)
Indirect Bilirubin: 1.3 mg/dL — ABNORMAL HIGH (ref 0.3–0.9)
Total Bilirubin: 3.5 mg/dL — ABNORMAL HIGH (ref 0.3–1.2)
Total Protein: 4.9 g/dL — ABNORMAL LOW (ref 6.5–8.1)

## 2018-10-08 LAB — GLUCOSE, CAPILLARY
Glucose-Capillary: 103 mg/dL — ABNORMAL HIGH (ref 70–99)
Glucose-Capillary: 105 mg/dL — ABNORMAL HIGH (ref 70–99)
Glucose-Capillary: 115 mg/dL — ABNORMAL HIGH (ref 70–99)
Glucose-Capillary: 119 mg/dL — ABNORMAL HIGH (ref 70–99)
Glucose-Capillary: 81 mg/dL (ref 70–99)
Glucose-Capillary: 92 mg/dL (ref 70–99)
Glucose-Capillary: 97 mg/dL (ref 70–99)

## 2018-10-08 LAB — CBC
HCT: 31.2 % — ABNORMAL LOW (ref 36.0–46.0)
Hemoglobin: 10.8 g/dL — ABNORMAL LOW (ref 12.0–15.0)
MCH: 28.1 pg (ref 26.0–34.0)
MCHC: 34.6 g/dL (ref 30.0–36.0)
MCV: 81 fL (ref 80.0–100.0)
Platelets: 38 10*3/uL — ABNORMAL LOW (ref 150–400)
RBC: 3.85 MIL/uL — ABNORMAL LOW (ref 3.87–5.11)
RDW: 14.5 % (ref 11.5–15.5)
WBC: 6.5 10*3/uL (ref 4.0–10.5)
nRBC: 0 % (ref 0.0–0.2)

## 2018-10-08 LAB — HIV ANTIBODY (ROUTINE TESTING W REFLEX): HIV Screen 4th Generation wRfx: NONREACTIVE

## 2018-10-08 LAB — PATHOLOGIST SMEAR REVIEW: Path Review: 6182020

## 2018-10-08 LAB — HAPTOGLOBIN: Haptoglobin: 324 mg/dL (ref 37–355)

## 2018-10-08 MED ORDER — POTASSIUM CHLORIDE CRYS ER 20 MEQ PO TBCR
20.0000 meq | EXTENDED_RELEASE_TABLET | Freq: Once | ORAL | Status: AC
  Administered 2018-10-08: 20 meq via ORAL
  Filled 2018-10-08: qty 1

## 2018-10-08 NOTE — Telephone Encounter (Signed)
The records are available in [place] and Dr. Jimmy Footman has access to that at the hospital. He is an excellent doctor.

## 2018-10-08 NOTE — Progress Notes (Signed)
RN assisted pt to [facility]. Pt voided 58mLs urine. Will continue to monitor.

## 2018-10-08 NOTE — Progress Notes (Addendum)
Inpatient Rehab Admissions:  Inpatient Rehab Consult received.  I met with patient at the bedside for rehabilitation assessment and to discuss goals and expectations of an inpatient rehab admission.  Therapy notes some confusion; she was able to answer all of my questions appropriately.  When asked about the possibility of 24/7 support at discharge she notes that "everybody works." Will need to confirm with husband that family can provide 24/7 supervision before proceeding with admission. I will attempt to contact him today.   Addendum: I was able to speak to pt's husband, Jenny Reichmann.  He confirms that family is not able to provide 24/7 supervision due to work obligations.  If pt's cognition improves to baseline, we could possibly consider her; otherwise, she will likely need to pursue SNF.  Jenny Reichmann is aware.  Will follow for a few more days to observe whether confusion clears up before making a determination.  Signed: Shann Medal, PT, DPT Admissions Coordinator [phone] 10/08/18  12:34 PM

## 2018-10-08 NOTE — Progress Notes (Signed)
Patient voided in BSC 450 mL of amber colored urine. RN will continue to monitor.

## 2018-10-08 NOTE — Progress Notes (Signed)
Bladder scan on pt resulted 440 mLs. RN encouraged pt to use BSC to empty bladder. Pt was compliant and was able to void 500 mLs urine total. Pt was returned to bed. Tolerated mobility well with no complaints. Purewick in place. Will continue to monitor.

## 2018-10-08 NOTE — Plan of Care (Signed)

## 2018-10-08 NOTE — Progress Notes (Signed)
Subjective: Interval History: has no complaint, feels better.  Objective: Vital signs in last 24 hours: Temp:  [98.4 F (36.9 C)-99.8 F (37.7 C)] 98.4 F (36.9 C) (06/18 0337) Pulse Rate:  [110-133] 110 (06/18 0337) Resp:  [22-37] 32 (06/18 0337) BP: (139-169)/(70-116) 154/81 (06/18 0337) SpO2:  [93 %-98 %] 97 % (06/18 0337) Weight:  [80.3 kg] 80.3 kg (06/18 0504) Weight change: 7.271 kg  Intake/Output from previous day: 06/17 0701 - 06/18 0700 In: 2594.8 [P.O.:240; I.V.:1205.2; IV Piggyback:1149.6] Out: 1000 [Urine:1000] Intake/Output this shift: No intake/output data recorded.  General appearance: alert, cooperative, no distress, moderately obese and pale Resp: diminished breath sounds bilaterally and rales bibasilar Cardio: S1, S2 normal and systolic murmur: systolic ejection 2/6, crescendo and decrescendo at 2nd left intercostal space GI: obese, pos bs, soft, Extremities: edema 2 +  Lab Results: Recent Labs    10/06/18 0845 10/06/18 1115 10/07/18 0719  WBC 10.3  --  8.8  HGB 13.3 12.9 11.3*  HCT 39.1 38.0 32.7*  PLT 65*  --  36*   BMET:  Recent Labs    10/06/18 0845 10/06/18 1115 10/07/18 0719  NA 129* 132* 132*  K 4.2 4.1 3.5  CL 99  --  98  CO2 15*  --  22  GLUCOSE 389*  --  180*  BUN 53*  --  53*  CREATININE 2.89*  --  3.23*  CALCIUM 7.4*  --  6.6*   No results for input(s): PTH in the last 72 hours. Iron Studies:  Recent Labs    10/07/18 1426  IRON 7*  TIBC 167*    Studies/Results: Ct Abdomen Pelvis Wo Contrast  Result Date: 10/06/2018 CLINICAL DATA:  Altered mental status, nausea, vomiting and fevers since 10/05/2018. EXAM: CT ABDOMEN AND PELVIS WITHOUT CONTRAST TECHNIQUE: Multidetector CT imaging of the abdomen and pelvis was performed following the standard protocol without IV contrast. COMPARISON:  CT pelvis 07/04/2017. FINDINGS: Lower chest: There is cardiomegaly. Tiny right pleural effusion is noted. Minimal dependent atelectasis is  present. Hepatobiliary: The liver is diffusely low attenuating and measures 20 cm craniocaudal. No focal liver lesion. Status post cholecystectomy. Biliary tree is unremarkable. Pancreas: Unremarkable. No pancreatic ductal dilatation or surrounding inflammatory changes. Spleen: Normal in size without focal abnormality. Adrenals/Urinary Tract: The adrenal glands appear normal. The right kidney appears edematous relative to the left. There is stranding about the right kidney and ureter. No stone is identified. Small phlebolith over the right psoas is unchanged compared to the prior CT. 0.4 cm nonobstructing stone left kidney noted. Urinary bladder appears normal. Stomach/Bowel: Stomach is within normal limits. Appendix appears normal. The appendix is not visualized but no evidence of appendicitis is seen. No evidence of bowel wall thickening, distention, or inflammatory changes. Vascular/Lymphatic: No significant vascular findings are present. No enlarged abdominal or pelvic lymph nodes. Reproductive: Status post hysterectomy. No adnexal masses. Other: There is a small volume of free pelvic fluid. Trace amount of fluid off the inferior margin of the liver is also identified. No focal fluid collection. Musculoskeletal: No fracture or focal bony lesion. Trace anterolisthesis L4 on L5 due to facet arthropathy noted. IMPRESSION: The right kidney appears edematous with stranding about the kidney and ureter most worrisome for pyelonephritis and possibly ureteritis. Negative for right urinary tract stone. 0.4 cm nonobstructing stone left kidney. Hepatomegaly and diffuse fatty infiltration of the liver. Cardiomegaly. Trace right pleural effusion. Electronically Signed   By: Inge Rise M.D.   On: 10/06/2018 15:44   Ct  Head Wo Contrast  Result Date: 10/06/2018 CLINICAL DATA:  Nausea, vomiting and fever for 4 days. Confusion since 12 noon yesterday. EXAM: CT HEAD WITHOUT CONTRAST TECHNIQUE: Contiguous axial images were  obtained from the base of the skull through the vertex without intravenous contrast. COMPARISON:  None. FINDINGS: Brain: No evidence of acute infarction, hemorrhage, hydrocephalus, extra-axial collection or mass lesion/mass effect. Vascular: No hyperdense vessel or unexpected calcification. Skull: Intact.  No focal lesion. Sinuses/Orbits: Status post cataract surgery.  Otherwise negative. Other: None. IMPRESSION: Negative head CT. Electronically Signed   By: Inge Rise M.D.   On: 10/06/2018 11:14   US Abdomen Complete  Result Date: 10/07/2018 CLINICAL DATA:  Abnormal LFTs EXAM: ABDOMEN ULTRASOUND COMPLETE COMPARISON:  None. FINDINGS: Gallbladder: Surgically absent. Common bile duct: Diameter: 3.3 mm Liver: Increased heterogeneous echotexture with no focal mass. Portal vein is patent on color Doppler imaging with normal direction of blood flow towards the liver. IVC: No abnormality visualized. Pancreas: Visualized portion unremarkable. Spleen: Size and appearance within normal limits. Right Kidney: Length: 11.8 cm. Echogenicity within normal limits. No mass or hydronephrosis visualized. Left Kidney: Length: 11.3 cm. Contains a nonshadowing stone in the mid to lower pole. Abdominal aorta: Obscured distally by shadowing bowel gas. No aneurysm seen. Other findings: Small right pleural effusion.  Small ascites. IMPRESSION: 1. Small right pleural effusion and ascites. 2. Increased heterogeneous echotexture in the liver is nonspecific but often seen with hepatic steatosis. 3. No other abnormalities identified. Electronically Signed   By: Dorise Bullion III M.D   On: 10/07/2018 14:18   Dg Chest Port 1 View  Result Date: 10/06/2018 CLINICAL DATA:  Fever, tachypnea, history hypertension, diabetes mellitus EXAM: PORTABLE CHEST 1 VIEW COMPARISON:  Portable exam 0903 hours compared to 07/03/2017 FINDINGS: Borderline of cardiac silhouette, likely accentuated by low lung volumes and lordotic technique. Mediastinal  contours and pulmonary vascularity normal. Hypoinflated lungs without infiltrate, pleural effusion or pneumothorax. Bones demineralized. IMPRESSION: No acute abnormalities. Electronically Signed   By: Lavonia Dana M.D.   On: 10/06/2018 09:39    I have reviewed the patient's current medications.  Assessment/Plan: 1 AKI nonoliguric, chem pending.  MS much improved . Renal size ok . Suspect sepsis with enteobact, and ARB/Metformen 2 Anemia 3 Enterbact pyelo on Ab 4 DM per primary 5 obesity 6 Low ptlt suspect sepsis P check chem, follow vol , ptlt, cont AB    LOS: 2 days   Jeneen Rinks Osman Calzadilla 10/08/2018,7:48 AM

## 2018-10-08 NOTE — Progress Notes (Signed)
Pt voided in bsc approximately 500 mL amber colored cloudy urine. RN will continue to monitor.

## 2018-10-08 NOTE — Telephone Encounter (Signed)
[name] [phone]  Caryl Pina called to ask if her mother Allison Patel had any previous kidney issues or damage in the past, she stated that Dr Deterding had called to ask. Caryl Pina would like for you to call and talk with her about her mom. Kidney functions are not improving.

## 2018-10-08 NOTE — Discharge Instructions (Signed)
Glucose Products:  [employer] glucose products raise low blood sugar fast. Tablets are free of fat, caffeine, sodium and gluten. They are portable and easy to carry, making it easier for people with diabetes to BE PREPARED for lows.  Glucose Tablets Available in 6 flavors  10 ct...................................... $1.00  50 ct...................................... $3.98 Glucose Shot..................................$1.48 Glucose Gel....................................$3.44  Alcohol Swabs Alcohol swabs are used to sterilize your injection site. All of our swabs are individually wrapped for maximum safety, convenience and moisture retention. [employer] Alcohol Swabs  100 ct Swabs..............................$1.00  400 ct Swabs..............................$3.74

## 2018-10-08 NOTE — Progress Notes (Signed)
Inpatient Diabetes Program   AACE/ADA: New Consensus Statement on Inpatient Glycemic Control (2015)  Target Ranges:  Prepandial:   less than 140 mg/dL      Peak postprandial:   less than 180 mg/dL (1-2 hours)      Critically ill patients:  140 - 180 mg/dL     Spoke with patient about diabetes and home regimen for diabetes control. Patient reports that she is followed by Dr. Buddy Duty, Endocrinology, for diabetes management and has not seen him in awhile.  Patient reports not taking insulin as prescribed and maybe checking her glucose 2 times a week. Patient says when she takes her Humalog she guesses how much to take. She also takes her Lantus maybe 2-3 times a week.   Discussed compliance with home medications. Patient asked what she was on here because her glucose looked good. I told her she was on the equivalent of her home dose of basal insulin and on a similar correction scale. Discussed current A1c level of 14%.  Discussed glucose and A1c goals. Discussed importance of checking CBGs and maintaining good CBG control to prevent long-term and short-term complications. Explained how hyperglycemia leads to damage within blood vessels which lead to the common complications seen with uncontrolled diabetes.   Discussed impact of nutrition, exercise, stress, sickness, and medications on diabetes control. Discussed carbohydrates, carbohydrate goals per day and meal, along with portion sizes. Encouraged patient to check glucose 2 times per day (fasting and alternating second check). Patient verbalized understanding of information discussed and has no further questions at this time related to diabetes.  D/c patient on home regimen and she knows to follow up with Dr. Buddy Duty post hospitalization.  Thanks,  Tama Headings RN, MSN, BC-ADM Inpatient Diabetes Coordinator Team Pager [phone] (8a-5p)

## 2018-10-08 NOTE — Progress Notes (Signed)
PROGRESS NOTE    Allison Patel  XTK:[id number] DOB: June 14, 1955 DOA: 10/06/2018 PCP: Elby Showers, MD   Brief Narrative: Allison Patel is a 63 y.o. female with a history of diabetes, hypertension, hypothyroidism. Patient presented with confusion and found to have sepsis secondary to pyelonephritis. On antibiotics.   Assessment & Plan:   Principal Problem:   Severe sepsis ([place]) Active Problems:   Benign essential HTN   Hypercholesterolemia without hypertriglyceridemia   Adult hypothyroidism   Diabetes mellitus type 2, uncontrolled ([place])   Severe sepsis Present on admission with fever, tachycardia. Physiology improving. Started on empiric Cefepime, vancomycin and metronidazole; now on Cefepime. -Continue Cefepime  Acute pyelonephritis Urine and blood cultures significant for  [facility] consulted and are on board. Patient is non-oliguric. Creatinine rising. UOP of 1L in last 24 hours. In setting of severe sepsis in addition to ARB/metformin use as an outpatient -Nephrology recommendations -Daily metabolic panel  Anemia Acute. Unknown etiology but iron studies suggests possible anemia of chronic disease vs iron deficiency. Ferritin not obtained. Mild. No obvious source of bleeding.  -Trend CBC -Ferritin  Thrombocytopenia Acute. Secondary to sepsis -Trend CBC  Diabetes mellitus, type 2 On sitagliptin-metformin, Lantus and Humalog as an outpatient. Last hemoglobin A1C of 14% on 6/16. -Discontinue q4 hour Novolog and switch to SSI qAC -Continue Levemir 10 units BID  Essential hypertension On olmesartan-hydrochlorothiazide, clonidine, amlodipine as an outpatient. BP is mildly elevated while inpatient -Watch BP for now since soft BPs  Acute respiratory failure with hypoxia Chest x-ray unremarkable. No symptoms. -Wean to room air  Anxiety Patient is on Xanax as an outpatient -Continue Xanax  Hyperlipidemia On Crestor as an outpatient  Morbid  obesity Body mass index is 35.16 kg/m.    DVT prophylaxis: SCDs Code Status:   Code Status: Full Code Family Communication: None Disposition Plan: Possible CIR   Consultants:   PCCM  Nephrology  Procedures:   None  Antimicrobials:  Vancomycin (6/16)  Flagyl (6/16)  Cefepime (6/16>>    Subjective: No issues this morning.  Objective: Vitals:   10/08/18 0015 10/08/18 0337 10/08/18 0504 10/08/18 0800  BP: (!) 143/85 (!) 154/81  (!) 155/90  Pulse: (!) 112 (!) 110  (!) 106  Resp: (!) 26 (!) 32  (!) 33  Temp: 99 F (37.2 C) 98.4 F (36.9 C)    TempSrc: Oral Oral    SpO2: 98% 97%  94%  Weight:   80.3 kg   Height:        Intake/Output Summary (Last 24 hours) at 10/08/2018 0854 Last data filed at 10/08/2018 0657 Gross per 24 hour  Intake 2393.15 ml  Output 1000 ml  Net 1393.15 ml   Filed Weights   10/06/18 0820 10/07/18 0500 10/08/18 0504  Weight: 73 kg 77.5 kg 80.3 kg    Examination:  General exam: Appears calm and comfortable Respiratory system: Clear to auscultation. Respiratory effort normal. Cardiovascular system: S1 & S2 heard, RRR. No murmurs, rubs, gallops or clicks. Gastrointestinal system: Abdomen is nondistended, soft and nontender. No organomegaly or masses felt. Normal bowel sounds heard. Central nervous system: Alert and oriented. No focal neurological deficits. Extremities: No edema. No calf tenderness Skin: No cyanosis. No rashes Psychiatry: Judgement and insight appear normal. Mood & affect appropriate.     Data Reviewed: I have personally reviewed following labs and imaging studies  CBC: Recent Labs  Lab 10/06/18 0845 10/06/18 1115 10/07/18 0719  WBC 10.3  --  8.8  NEUTROABS 7.2  --   --  HGB 13.3 12.9 11.3*  HCT 39.1 38.0 32.7*  MCV 82.1  --  80.9  PLT 65*  --  36*   Basic Metabolic Panel: Recent Labs  Lab 10/06/18 0845 10/06/18 1115 10/07/18 0719  NA 129* 132* 132*  K 4.2 4.1 3.5  CL 99  --  98  CO2 15*  --  22   GLUCOSE 389*  --  180*  BUN 53*  --  53*  CREATININE 2.89*  --  3.23*  CALCIUM 7.4*  --  6.6*  MG  --   --  1.6*  PHOS  --   --  2.1*   GFR: Estimated Creatinine Clearance: 16.8 mL/min (A) (by C-G formula based on SCr of 3.23 mg/dL (H)). Liver Function Tests: Recent Labs  Lab 10/06/18 0845 10/07/18 0719 10/08/18 0237  AST 67* 83* 85*  ALT 87* 67* 61*  ALKPHOS 193* 243* 248*  BILITOT 1.9* 3.2* 3.5*  PROT 5.7* 5.0* 4.9*  ALBUMIN 2.5* 2.0* 1.8*   No results for input(s): LIPASE, AMYLASE in the last 168 hours. No results for input(s): AMMONIA in the last 168 hours. Coagulation Profile: No results for input(s): INR, PROTIME in the last 168 hours. Cardiac Enzymes: No results for input(s): CKTOTAL, CKMB, CKMBINDEX, TROPONINI in the last 168 hours. BNP (last 3 results) No results for input(s): PROBNP in the last 8760 hours. HbA1C: Recent Labs    10/06/18 0845  HGBA1C 14.0*   CBG: Recent Labs  Lab 10/07/18 1553 10/07/18 1837 10/07/18 2029 10/08/18 0000 10/08/18 0341  GLUCAP 170* 150* 139* 115* 103*   Lipid Profile: No results for input(s): CHOL, HDL, LDLCALC, TRIG, CHOLHDL, LDLDIRECT in the last 72 hours. Thyroid Function Tests: Recent Labs    10/06/18 0845  TSH 2.637   Anemia Panel: Recent Labs    10/07/18 1426  TIBC 167*  IRON 7*   Sepsis Labs: Recent Labs  Lab 10/06/18 0835 10/06/18 1104  LATICACIDVEN 4.2* 4.0*    Recent Results (from the past 240 hour(s))  Blood Culture (routine x 2)     Status: None (Preliminary result)   Collection Time: 10/06/18  8:45 AM   Specimen: BLOOD  Result Value Ref Range Status   Specimen Description BLOOD LEFT ANTECUBITAL  Final   Special Requests   Final    BOTTLES DRAWN AEROBIC AND ANAEROBIC Blood Culture adequate volume   Culture  Setup Time   Final    IN BOTH AEROBIC AND ANAEROBIC BOTTLES GRAM NEGATIVE RODS CRITICAL VALUE NOTED.  VALUE IS CONSISTENT WITH PREVIOUSLY REPORTED AND CALLED VALUE. Performed at  [facility], [place] [address], [place],  [address]    Culture GRAM NEGATIVE RODS  Final   Report Status PENDING  Incomplete  SARS Coronavirus 2     Status: None   Collection Time: 10/06/18  8:49 AM  Result Value Ref Range Status   SARS Coronavirus 2 NOT DETECTED NOT DETECTED Final    Comment: (NOTE) SARS-CoV-2 target nucleic acids are NOT DETECTED. The SARS-CoV-2 RNA is generally detectable in upper and lower respiratory specimens during the acute phase of infection.  Negative  results do not preclude SARS-CoV-2 infection, do not rule out co-infections with other pathogens, and should not be used as the sole basis for treatment or other patient management decisions.  Negative results must be combined with clinical observations, patient history, and epidemiological information. The expected result is Not Detected. Fact Sheet for Patients: http://www.biofiredefense.com/wp-content/uploads/2020/03/BIOFIRE-COVID -19-patients.pdf Fact Sheet for Healthcare Providers: http://www.biofiredefense.com/wp-content/uploads/2020/03/BIOFIRE-COVID -19-hcp.pdf  This test is not yet approved or cleared by the [place] and  has been authorized for detection and/or diagnosis of SARS-CoV-2 by FDA under an Emergency Use Authorization (EUA).  This EUA will remain in effec t (meaning this test can be used) for the duration of  the COVID-19 declaration under Section 564(b)(1) of the Act, 21 U.S.C. section 360bbb-3(b)(1), unless the authorization is terminated or revoked sooner. Performed at [facility], [place] [address], [place], [place] [address]   Blood Culture (routine x 2)     Status: None (Preliminary result)   Collection Time: 10/06/18  8:50 AM   Specimen: BLOOD  Result Value Ref Range Status   Specimen Description BLOOD BLOOD LEFT WRIST  Final   Special Requests   Final    BOTTLES DRAWN AEROBIC AND ANAEROBIC Blood Culture adequate volume   Culture  Setup Time   Final     GRAM NEGATIVE RODS CRITICAL RESULT CALLED TO, READ BACK BY AND VERIFIED WITH: PHRMD L SEAY @0318  10/07/18 BY S GEZAHEGN IN BOTH AEROBIC AND ANAEROBIC BOTTLES    Culture   Final    NO GROWTH 1 DAY Performed at [facility], [place] [address], [place], [place] [address]    Report Status PENDING  Incomplete  Blood Culture ID Panel (Reflexed)     Status: Abnormal   Collection Time: 10/06/18  8:50 AM  Result Value Ref Range Status   Enterococcus species NOT DETECTED NOT DETECTED Final   Listeria monocytogenes NOT DETECTED NOT DETECTED Final   Staphylococcus species NOT DETECTED NOT DETECTED Final   Staphylococcus aureus (BCID) NOT DETECTED NOT DETECTED Final   Streptococcus species NOT DETECTED NOT DETECTED Final   Streptococcus agalactiae NOT DETECTED NOT DETECTED Final   Streptococcus pneumoniae NOT DETECTED NOT DETECTED Final   Streptococcus pyogenes NOT DETECTED NOT DETECTED Final   Acinetobacter baumannii NOT DETECTED NOT DETECTED Final   Enterobacteriaceae species DETECTED (A) NOT DETECTED Final    Comment: Enterobacteriaceae represent a large family of gram-negative bacteria, not a single organism. CRITICAL RESULT CALLED TO, READ BACK BY AND VERIFIED WITH: PHRMD L SEAY @0318  10/07/18 BY S GEZAHEGN    Enterobacter cloacae complex DETECTED (A) NOT DETECTED Final    Comment: CRITICAL RESULT CALLED TO, READ BACK BY AND VERIFIED WITH: PHRMD L SEAY @0318  10/07/18 BY S GEZAHEGN    Escherichia coli NOT DETECTED NOT DETECTED Final   Klebsiella oxytoca NOT DETECTED NOT DETECTED Final   Klebsiella pneumoniae NOT DETECTED NOT DETECTED Final   Proteus species NOT DETECTED NOT DETECTED Final   Serratia marcescens NOT DETECTED NOT DETECTED Final   Carbapenem resistance NOT DETECTED NOT DETECTED Final   Haemophilus influenzae NOT DETECTED NOT DETECTED Final   Neisseria meningitidis NOT DETECTED NOT DETECTED Final   Pseudomonas aeruginosa NOT DETECTED NOT DETECTED Final   Candida albicans  NOT DETECTED NOT DETECTED Final   Candida glabrata NOT DETECTED NOT DETECTED Final   Candida krusei NOT DETECTED NOT DETECTED Final   Candida parapsilosis NOT DETECTED NOT DETECTED Final   Candida tropicalis NOT DETECTED NOT DETECTED Final    Comment: Performed at [facility], [place] [address] [place], [place] [address]  Urine culture     Status: Abnormal (Preliminary result)   Collection Time: 10/06/18 10:10 AM   Specimen: Urine, Random  Result Value Ref Range Status   Specimen Description URINE, RANDOM  Final   Special Requests Immunocompromised  Final   Culture (A)  Final    >=100,000 COLONIES/mL ENTEROBACTER CLOACAE CULTURE REINCUBATED FOR BETTER GROWTH Performed at [facility], [place] [address], [place], [place] [address]    Report Status PENDING  Incomplete   Organism ID, Bacteria ENTEROBACTER CLOACAE (A)  Final      Susceptibility   Enterobacter cloacae - MIC*    CEFAZOLIN >=64 RESISTANT Resistant     CEFTRIAXONE <=1 SENSITIVE Sensitive     CIPROFLOXACIN <=0.25 SENSITIVE Sensitive     GENTAMICIN <=1 SENSITIVE Sensitive     IMIPENEM 0.5 SENSITIVE Sensitive     NITROFURANTOIN 32 SENSITIVE Sensitive     TRIMETH/SULFA <=20 SENSITIVE Sensitive     PIP/TAZO <=4 SENSITIVE Sensitive     * >=100,000 COLONIES/mL ENTEROBACTER CLOACAE  MRSA PCR Screening     Status: None   Collection Time: 10/06/18  6:11 PM   Specimen: Nasal Mucosa; Nasopharyngeal  Result Value Ref Range Status   MRSA by PCR NEGATIVE NEGATIVE Final    Comment:        The GeneXpert MRSA Assay (FDA approved for NASAL specimens only), is one component of a comprehensive MRSA colonization surveillance program. It is not intended to diagnose MRSA infection nor to guide or monitor treatment for MRSA infections. Performed at [facility], [place] [address] [place], [place] [address]          Radiology Studies: Ct Abdomen Pelvis Wo Contrast  Result Date: 10/06/2018 CLINICAL DATA:  Altered  mental status, nausea, vomiting and fevers since 10/05/2018. EXAM: CT ABDOMEN AND PELVIS WITHOUT CONTRAST TECHNIQUE: Multidetector CT imaging of the abdomen and pelvis was performed following the standard protocol without IV contrast. COMPARISON:  CT pelvis 07/04/2017. FINDINGS: Lower chest: There is cardiomegaly. Tiny right pleural effusion is noted. Minimal dependent atelectasis is present. Hepatobiliary: The liver is diffusely low attenuating and measures 20 cm craniocaudal. No focal liver lesion. Status post cholecystectomy. Biliary tree is unremarkable. Pancreas: Unremarkable. No pancreatic ductal dilatation or surrounding inflammatory changes. Spleen: Normal in size without focal abnormality. Adrenals/Urinary Tract: The adrenal glands appear normal. The right kidney appears edematous relative to the left. There is stranding about the right kidney and ureter. No stone is identified. Small phlebolith over the right psoas is unchanged compared to the prior CT. 0.4 cm nonobstructing stone left kidney noted. Urinary bladder appears normal. Stomach/Bowel: Stomach is within normal limits. Appendix appears normal. The appendix is not visualized but no evidence of appendicitis is seen. No evidence of bowel wall thickening, distention, or inflammatory changes. Vascular/Lymphatic: No significant vascular findings are present. No enlarged abdominal or pelvic lymph nodes. Reproductive: Status post hysterectomy. No adnexal masses. Other: There is a small volume of free pelvic fluid. Trace amount of fluid off the inferior margin of the liver is also identified. No focal fluid collection. Musculoskeletal: No fracture or focal bony lesion. Trace anterolisthesis L4 on L5 due to facet arthropathy noted. IMPRESSION: The right kidney appears edematous with stranding about the kidney and ureter most worrisome for pyelonephritis and possibly ureteritis. Negative for right urinary tract stone. 0.4 cm nonobstructing stone left kidney.  Hepatomegaly and diffuse fatty infiltration of the liver. Cardiomegaly. Trace right pleural effusion. Electronically Signed   By: Inge Rise M.D.   On: 10/06/2018 15:44   Ct Head Wo Contrast  Result Date: 10/06/2018 CLINICAL DATA:  Nausea, vomiting and fever for 4 days. Confusion since 12 noon yesterday. EXAM: CT HEAD WITHOUT CONTRAST TECHNIQUE: Contiguous axial images were obtained from the base of the skull through  the vertex without intravenous contrast. COMPARISON:  None. FINDINGS: Brain: No evidence of acute infarction, hemorrhage, hydrocephalus, extra-axial collection or mass lesion/mass effect. Vascular: No hyperdense vessel or unexpected calcification. Skull: Intact.  No focal lesion. Sinuses/Orbits: Status post cataract surgery.  Otherwise negative. Other: None. IMPRESSION: Negative head CT. Electronically Signed   By: Inge Rise M.D.   On: 10/06/2018 11:14   US Abdomen Complete  Result Date: 10/07/2018 CLINICAL DATA:  Abnormal LFTs EXAM: ABDOMEN ULTRASOUND COMPLETE COMPARISON:  None. FINDINGS: Gallbladder: Surgically absent. Common bile duct: Diameter: 3.3 mm Liver: Increased heterogeneous echotexture with no focal mass. Portal vein is patent on color Doppler imaging with normal direction of blood flow towards the liver. IVC: No abnormality visualized. Pancreas: Visualized portion unremarkable. Spleen: Size and appearance within normal limits. Right Kidney: Length: 11.8 cm. Echogenicity within normal limits. No mass or hydronephrosis visualized. Left Kidney: Length: 11.3 cm. Contains a nonshadowing stone in the mid to lower pole. Abdominal aorta: Obscured distally by shadowing bowel gas. No aneurysm seen. Other findings: Small right pleural effusion.  Small ascites. IMPRESSION: 1. Small right pleural effusion and ascites. 2. Increased heterogeneous echotexture in the liver is nonspecific but often seen with hepatic steatosis. 3. No other abnormalities identified. Electronically Signed    By: Dorise Bullion III M.D   On: 10/07/2018 14:18   Dg Chest Port 1 View  Result Date: 10/06/2018 CLINICAL DATA:  Fever, tachypnea, history hypertension, diabetes mellitus EXAM: PORTABLE CHEST 1 VIEW COMPARISON:  Portable exam 0903 hours compared to 07/03/2017 FINDINGS: Borderline of cardiac silhouette, likely accentuated by low lung volumes and lordotic technique. Mediastinal contours and pulmonary vascularity normal. Hypoinflated lungs without infiltrate, pleural effusion or pneumothorax. Bones demineralized. IMPRESSION: No acute abnormalities. Electronically Signed   By: Lavonia Dana M.D.   On: 10/06/2018 09:39        Scheduled Meds:  insulin aspart  1-3 Units Subcutaneous Q4H   insulin detemir  10 Units Subcutaneous Q12H   Continuous Infusions:  ceFEPime (MAXIPIME) IV Stopped (10/07/18 0926)   dextrose 5 % and 0.45% NaCl 10 mL/hr at 10/07/18 2200   insulin Stopped (10/07/18 1053)     LOS: 2 days     Cordelia Poche, MD [facility] 10/08/2018, 8:54 AM  If 7PM-7AM, please contact night-coverage [email or web address]

## 2018-10-09 LAB — HEPATIC FUNCTION PANEL
ALT: 61 U/L — ABNORMAL HIGH (ref 0–44)
AST: 79 U/L — ABNORMAL HIGH (ref 15–41)
Albumin: 1.6 g/dL — ABNORMAL LOW (ref 3.5–5.0)
Alkaline Phosphatase: 268 U/L — ABNORMAL HIGH (ref 38–126)
Bilirubin, Direct: 2.3 mg/dL — ABNORMAL HIGH (ref 0.0–0.2)
Indirect Bilirubin: 1.6 mg/dL — ABNORMAL HIGH (ref 0.3–0.9)
Total Bilirubin: 3.9 mg/dL — ABNORMAL HIGH (ref 0.3–1.2)
Total Protein: 5.2 g/dL — ABNORMAL LOW (ref 6.5–8.1)

## 2018-10-09 LAB — GLUCOSE, CAPILLARY
Glucose-Capillary: 91 mg/dL (ref 70–99)
Glucose-Capillary: 126 mg/dL — ABNORMAL HIGH (ref 70–99)
Glucose-Capillary: 129 mg/dL — ABNORMAL HIGH (ref 70–99)
Glucose-Capillary: 140 mg/dL — ABNORMAL HIGH (ref 70–99)
Glucose-Capillary: 137 mg/dL — ABNORMAL HIGH (ref 70–99)
Glucose-Capillary: 111 mg/dL — ABNORMAL HIGH (ref 70–99)

## 2018-10-09 LAB — CBC
HCT: 29.1 % — ABNORMAL LOW (ref 36.0–46.0)
Hemoglobin: 10.1 g/dL — ABNORMAL LOW (ref 12.0–15.0)
MCH: 27.7 pg (ref 26.0–34.0)
MCHC: 34.7 g/dL (ref 30.0–36.0)
MCV: 79.9 fL — ABNORMAL LOW (ref 80.0–100.0)
Platelets: 43 10*3/uL — ABNORMAL LOW (ref 150–400)
RBC: 3.64 MIL/uL — ABNORMAL LOW (ref 3.87–5.11)
RDW: 14.2 % (ref 11.5–15.5)
WBC: 7.2 10*3/uL (ref 4.0–10.5)
nRBC: 0 % (ref 0.0–0.2)

## 2018-10-09 LAB — RENAL FUNCTION PANEL
Albumin: 1.6 g/dL — ABNORMAL LOW (ref 3.5–5.0)
Anion gap: 12 (ref 5–15)
BUN: 57 mg/dL — ABNORMAL HIGH (ref 8–23)
CO2: 22 mmol/L (ref 22–32)
Calcium: 7.3 mg/dL — ABNORMAL LOW (ref 8.9–10.3)
Chloride: 97 mmol/L — ABNORMAL LOW (ref 98–111)
Creatinine, Ser: 3.34 mg/dL — ABNORMAL HIGH (ref 0.44–1.00)
GFR calc Af Amer: 16 mL/min — ABNORMAL LOW (ref >60)
GFR calc non Af Amer: 14 mL/min — ABNORMAL LOW (ref >60)
Glucose, Bld: 98 mg/dL (ref 70–99)
Phosphorus: 3 mg/dL (ref 2.5–4.6)
Potassium: 3.2 mmol/L — ABNORMAL LOW (ref 3.5–5.1)
Sodium: 131 mmol/L — ABNORMAL LOW (ref 135–145)

## 2018-10-09 LAB — CULTURE, BLOOD (ROUTINE X 2)
Special Requests: ADEQUATE
Special Requests: ADEQUATE

## 2018-10-09 MED ORDER — SODIUM CHLORIDE 0.9 % IV SOLN
2.0000 g | INTRAVENOUS | Status: DC
  Administered 2018-10-09 – 2018-10-12 (×4): 2 g via INTRAVENOUS
  Filled 2018-10-09 (×4): qty 2

## 2018-10-09 MED ORDER — AMLODIPINE BESYLATE 5 MG PO TABS
5.0000 mg | ORAL_TABLET | Freq: Every day | ORAL | Status: DC
  Administered 2018-10-09 – 2018-10-11 (×3): 5 mg via ORAL
  Filled 2018-10-09 (×3): qty 1

## 2018-10-09 MED ORDER — SODIUM CHLORIDE 0.9 % IV SOLN: 2.0000 g | INTRAVENOUS | Status: DC

## 2018-10-09 MED ORDER — HYDRALAZINE HCL 20 MG/ML IJ SOLN: 5.0000 mg | Freq: Four times a day (QID) | INTRAMUSCULAR | Status: DC | PRN

## 2018-10-09 MED ORDER — CLONIDINE HCL 0.2 MG PO TABS
0.2000 mg | ORAL_TABLET | Freq: Two times a day (BID) | ORAL | Status: DC
  Administered 2018-10-09 – 2018-10-23 (×29): 0.2 mg via ORAL
  Filled 2018-10-09 (×29): qty 1

## 2018-10-09 NOTE — Progress Notes (Signed)
Occupational Therapy Treatment Patient Details Name: Allison Patel MRN: [id number] DOB: 1955-07-11 Today's Date: 10/09/2018    History of present illness 63 year old woman with DM, HTN, prior gluteal abscess presenting with confusion since Sunday ~3 days. hx DM, HTN p/w severe sepsis secondary to enterobacter pyelnonephritis with hematogenous spread.   OT comments  Pt seen in conjunction with PT.  Pt is making excellent progress toward goals.  She is able to perform ADLs standing at sink for ~15 mins with min A for balance.  She requires min A +2 to ambulate to BR and mod A for peri hygiene.  She continues with very flat affect, as well as cognitive deficits including deficits with attention, sequencing, and mild memory.  The short blessed test was administered.  She scored 8/28 which indicates questionable cognitive impairment.  Based on progress to date, feel she would benefit from CIR   Follow Up Recommendations  CIR    Equipment Recommendations  3 in 1 bedside commode    Recommendations for Other Services      Precautions / Restrictions Precautions Precautions: Fall       Mobility Bed Mobility Overal bed mobility: Needs Assistance Bed Mobility: Sit to Supine       Sit to supine: Supervision      Transfers Overall transfer level: Needs assistance Equipment used: 1 person hand held assist Transfers: Sit to/from Stand;Stand Pivot Transfers Sit to Stand: Min assist Stand pivot transfers: Min assist       General transfer comment: requires assist for balance     Balance Overall balance assessment: Needs assistance Sitting-balance support: No upper extremity supported;Feet supported Sitting balance-Leahy Scale: Fair     Standing balance support: No upper extremity supported;Single extremity supported;During functional activity Standing balance-Leahy Scale: Poor Standing balance comment: requires UE support and min A for standing balance                             ADL either performed or assessed with clinical judgement   ADL Overall ADL's : Needs assistance/impaired     Grooming: Wash/dry hands;Wash/dry face;Oral care;Brushing hair;Minimal assistance;Standing Grooming Details (indicate cue type and reason): Pt stood for ~15 mins at sink to perform grooming      Lower Body Bathing: Moderate assistance;Sit to/from stand           Toilet Transfer: Minimal assistance;+2 for physical assistance;+2 for safety/equipment;Ambulation;Comfort height toilet;RW Armed forces technical officer Details (indicate cue type and reason): assist for balance  Toileting- Clothing Manipulation and Hygiene: Moderate assistance;Sit to/from stand       Functional mobility during ADLs: Minimal assistance;+2 for physical assistance;+2 for safety/equipment;Rolling walker       Vision       Perception     Praxis      Cognition Arousal/Alertness: Awake/alert Behavior During Therapy: Flat affect Overall Cognitive Status: Impaired/Different from baseline Area of Impairment: Attention;Memory;Following commands;Awareness;Problem solving                   Current Attention Level: Selective Memory: Decreased short-term memory Following Commands: Follows one step commands consistently;Follows multi-step commands inconsistently;Follows multi-step commands with increased time Safety/Judgement: Decreased awareness of deficits;Decreased awareness of safety Awareness: Intellectual Problem Solving: Slow processing;Decreased initiation;Difficulty sequencing;Requires verbal cues General Comments: Short blessed test was administered.  She scored 8/28 which demonstrates questionable impairment.  She demonstrated mild memory deficit, as well as deficit with attention, and sequencing  Exercises     Shoulder Instructions       General Comments HR to 121; 02 sats >92% on RA with activity     Pertinent Vitals/ Pain       Pain Assessment: No/denies  pain  Home Living                                          Prior Functioning/Environment              Frequency  Min 2X/week        Progress Toward Goals  OT Goals(current goals can now be found in the care plan section)  Progress towards OT goals: Progressing toward goals     Plan Discharge plan remains appropriate    Co-evaluation    PT/OT/SLP Co-Evaluation/Treatment: Yes Reason for Co-Treatment: Necessary to address cognition/behavior during functional activity;For patient/therapist safety;To address functional/ADL transfers   OT goals addressed during session: ADL's and self-care      AM-PAC OT "6 Clicks" Daily Activity     Outcome Measure   Help from another person eating meals?: None Help from another person taking care of personal grooming?: A Little Help from another person toileting, which includes using toliet, bedpan, or urinal?: A Lot Help from another person bathing (including washing, rinsing, drying)?: A Lot Help from another person to put on and taking off regular upper body clothing?: A Lot Help from another person to put on and taking off regular lower body clothing?: A Lot 6 Click Score: 15    End of Session Equipment Utilized During Treatment: Gait belt;Rolling walker  OT Visit Diagnosis: Unsteadiness on feet (R26.81);Other symptoms and signs involving cognitive function;Muscle weakness (generalized) (M62.81)   Activity Tolerance Patient tolerated treatment well   Patient Left in bed;with call bell/phone within reach;with bed alarm set   Nurse Communication Mobility status        Time: 0867-6195 OT Time Calculation (min): 29 min  Charges: OT General Charges $OT Visit: 1 Visit OT Treatments $Self Care/Home Management : 8-22 mins  Allison Patel, OTR/L [place] Pager [phone] Office [phone]    Allison Patel M 10/09/2018, 1:12 PM

## 2018-10-09 NOTE — Progress Notes (Signed)
Physical Therapy Treatment Patient Details Name: Allison Patel MRN: [id number] DOB: [birth year] Today's Date: 10/09/2018    History of Present Illness 63 year old woman with DM, HTN, prior gluteal abscess presenting with confusion since Sunday ~3 days. hx DM, HTN p/w severe sepsis secondary to enterobacter pyelnonephritis with hematogenous spread.    PT Comments    Pt admitted with above diagnosis. Pt currently with functional limitations due to the deficits listed below (see PT Problem List). Pt was able to ambulate with rollator with min guard assist but needed min  Assist of 2 for safety when walking without device.  Pt is a great rehab candidate as she will need all 3 disciplines and is showing progress.  Pt will benefit from skilled PT to increase their independence and safety with mobility to allow discharge to the venue listed below.     Follow Up Recommendations  CIR;Supervision/Assistance - 24 hour     Equipment Recommendations  Rolling walker with 5" wheels;3in1 (PT)    Recommendations for Other Services       Precautions / Restrictions Precautions Precautions: Fall Restrictions Weight Bearing Restrictions: No    Mobility  Bed Mobility Overal bed mobility: Needs Assistance Bed Mobility: Sit to Supine       Sit to supine: Supervision      Transfers Overall transfer level: Needs assistance Equipment used: 1 person hand held assist Transfers: Sit to/from Stand;Stand Pivot Transfers Sit to Stand: Min assist Stand pivot transfers: Min assist       General transfer comment: requires assist for balance   Ambulation/Gait Ambulation/Gait assistance: Min assist;+2 safety/equipment Gait Distance (Feet): 180 Feet Assistive device: 4-wheeled walker Gait Pattern/deviations: Step-through pattern;Decreased stride length;Staggering left;Wide base of support   Gait velocity interpretation: <1.31 ft/sec, indicative of household ambulator General Gait Details: Pt was  able to progress ambulation to hallway.  Used rollator and was steady overall wtih rollator needing cues to incr speed at times.  Pt ambulated with out the rollator as well with several LOB unless she had bil UE support.  LOB to left more than right.     Stairs             Wheelchair Mobility    Modified Rankin (Stroke Patients Only)       Balance Overall balance assessment: Needs assistance Sitting-balance support: No upper extremity supported;Feet supported Sitting balance-Leahy Scale: Fair     Standing balance support: No upper extremity supported;Single extremity supported;During functional activity Standing balance-Leahy Scale: Poor Standing balance comment: requires UE support and min A for standing balance.  Stood at sink and brushed teeth and combed hair.  OT got tangles out of back of pts hair as well with pt standing a total of at least 10 minutes.  Was demonstrating fatigue after standing but still walked to bathroom after this and used the bathroom.                             Cognition Arousal/Alertness: Awake/alert Behavior During Therapy: Flat affect Overall Cognitive Status: Impaired/Different from baseline Area of Impairment: Attention;Memory;Following commands;Awareness;Problem solving                   Current Attention Level: Selective Memory: Decreased short-term memory Following Commands: Follows one step commands consistently;Follows multi-step commands inconsistently;Follows multi-step commands with increased time Safety/Judgement: Decreased awareness of deficits;Decreased awareness of safety Awareness: Intellectual Problem Solving: Slow processing;Decreased initiation;Difficulty sequencing;Requires verbal cues General Comments: Short  blessed test was administered.  She scored 8/28 which demonstrates questionable impairment.  She demonstrated mild memory deficit, as well as deficit with attention, and sequencing       Exercises  General Exercises - Lower Extremity Ankle Circles/Pumps: AROM;Both;10 reps;Seated Long Arc Quad: AROM;Both;10 reps;Seated    General Comments General comments (skin integrity, edema, etc.): HR to 121 bpm from 92 bpm, sats >92% on RA.  Nurse agreed that O2 could be removed.  144/99      Pertinent Vitals/Pain Pain Assessment: No/denies pain    Home Living                      Prior Function            PT Goals (current goals can now be found in the care plan section) Acute Rehab PT Goals Patient Stated Goal: to go home Progress towards PT goals: Progressing toward goals    Frequency    Min 3X/week      PT Plan Current plan remains appropriate    Co-evaluation PT/OT/SLP Co-Evaluation/Treatment: Yes Reason for Co-Treatment: Necessary to address cognition/behavior during functional activity;For patient/therapist safety PT goals addressed during session: Mobility/safety with mobility OT goals addressed during session: ADL's and self-care      AM-PAC PT "6 Clicks" Mobility   Outcome Measure  Help needed turning from your back to your side while in a flat bed without using bedrails?: A Little Help needed moving from lying on your back to sitting on the side of a flat bed without using bedrails?: A Little Help needed moving to and from a bed to a chair (including a wheelchair)?: A Little Help needed standing up from a chair using your arms (e.g., wheelchair or bedside chair)?: A Little Help needed to walk in hospital room?: A Little Help needed climbing 3-5 steps with a railing? : A Lot 6 Click Score: 17    End of Session Equipment Utilized During Treatment: Gait belt Activity Tolerance: Patient limited by fatigue Patient left: with call bell/phone within reach;in bed;with bed alarm set Nurse Communication: Mobility status PT Visit Diagnosis: Muscle weakness (generalized) (M62.81);Unsteadiness on feet (R26.81)     Time: 1517-6160 PT Time Calculation (min)  (ACUTE ONLY): 41 min  Charges:  $Gait Training: 8-22 mins $Therapeutic Exercise: 8-22 mins                     [place] Pager:  [phone]  Office:  [place] 10/09/2018, 3:42 PM

## 2018-10-09 NOTE — Progress Notes (Signed)
[facility]    NEPHROLOGY PROGRESS NOTE  SUBJECTIVE: Patient seen and examined.  In good spirits today.  Currently denies headaches, fevers, chills, chest pain, shortness of breath, nausea, vomiting, diarrhea or dysuria.  The remainder of her 10 point review of systems was negative.  No acute events noted.    OBJECTIVE:  Vitals:   10/09/18 0721 10/09/18 1109  BP: (!) 157/87 (!) 144/66  Pulse: (!) 102 94  Resp:  (!) 25  Temp: 98.2 F (36.8 C) 98.9 F (37.2 C)  SpO2:  94%    Intake/Output Summary (Last 24 hours) at 10/09/2018 1321 Last data filed at 10/09/2018 1245 Gross per 24 hour  Intake 794.96 ml  Output 1300 ml  Net -505.04 ml      General:  AAOx3 NAD HEENT: MMM [place] AT anicteric sclera Neck:  No JVD, no adenopathy CV:  Heart RRR  Lungs: Lung sounds with scattered rales Abd:  abd SNT/ND with normal BS GU:  Bladder non-palpable Extremities: +1 bilateral lower extremity edema. Skin:  No skin rash  MEDICATIONS:  . amLODipine  5 mg Oral Daily  . cloNIDine  0.2 mg Oral BID  . insulin aspart  1-3 Units Subcutaneous Q4H  . insulin detemir  10 Units Subcutaneous Q12H       LABS:   CBC Latest Ref Rng & Units 10/09/2018 10/08/2018 10/07/2018  WBC 4.0 - 10.5 K/uL 7.2 6.5 8.8  Hemoglobin 12.0 - 15.0 g/dL 10.1(L) 10.8(L) 11.3(L)  Hematocrit 36.0 - 46.0 % 29.1(L) 31.2(L) 32.7(L)  Platelets 150 - 400 K/uL 43(L) 38(L) 36(L)    CMP Latest Ref Rng & Units 10/09/2018 10/08/2018 10/07/2018  Glucose 70 - 99 mg/dL 98 100(H) 180(H)  BUN 8 - 23 mg/dL 57(H) 57(H) 53(H)  Creatinine 0.44 - 1.00 mg/dL 3.34(H) 3.71(H) 3.23(H)  Sodium 135 - 145 mmol/L 131(L) 133(L) 132(L)  Potassium 3.5 - 5.1 mmol/L 3.2(L) 3.3(L) 3.5  Chloride 98 - 111 mmol/L 97(L) 98 98  CO2 22 - 32 mmol/L 22 22 22   Calcium 8.9 - 10.3 mg/dL 7.3(L) 6.9(L) 6.6(L)  Total Protein 6.5 - 8.1 g/dL 5.2(L) 4.9(L) 5.0(L)  Total Bilirubin 0.3 - 1.2 mg/dL 3.9(H) 3.5(H) 3.2(H)  Alkaline Phos 38 - 126 U/L 268(H)  248(H) 243(H)  AST 15 - 41 U/L 79(H) 85(H) 83(H)  ALT 0 - 44 U/L 61(H) 61(H) 67(H)    Lab Results  Component Value Date   CALCIUM 7.3 (L) 10/09/2018   CAION 0.94 (L) 10/06/2018   PHOS 3.0 10/09/2018       Component Value Date/Time   COLORURINE AMBER (A) 10/06/2018 0931   APPEARANCEUR TURBID (A) 10/06/2018 0931   LABSPEC 1.015 10/06/2018 0931   PHURINE 5.0 10/06/2018 0931   GLUCOSEU >=500 (A) 10/06/2018 0931   HGBUR MODERATE (A) 10/06/2018 0931   BILIRUBINUR NEGATIVE 10/06/2018 0931   BILIRUBINUR NEG 07/03/2017 1527   KETONESUR NEGATIVE 10/06/2018 0931   PROTEINUR >=300 (A) 10/06/2018 0931   UROBILINOGEN 0.2 07/03/2017 1527   NITRITE NEGATIVE 10/06/2018 0931   LEUKOCYTESUR MODERATE (A) 10/06/2018 0931      Component Value Date/Time   HCO3 17.4 (L) 10/06/2018 1115   TCO2 18 (L) 10/06/2018 1115   ACIDBASEDEF 8.0 (H) 10/06/2018 1115   O2SAT 65.0 10/06/2018 1115       Component Value Date/Time   IRON 7 (L) 10/07/2018 1426   TIBC 167 (L) 10/07/2018 1426   FERRITIN 740 (H) 10/08/2018 0957   IRONPCTSAT 4 (L) 10/07/2018 1426  ASSESSMENT/PLAN:    63 year old female patient with past medical history of diabetes, hypertension, and hypothyroidism presented for sepsis secondary to pyelonephritis.  1.  Baseline serum creatinine 0.85 from 03/16/2018.  2.  Acute kidney injury.  Likely on the basis of urosepsis.  Urinalysis consistent with infection.  Renal ultrasound revealed no evidence of obstruction.  There is a stone in the mid to lower left kidney.  Urine output is improving.  Serum creatinine is improving as well.  Sepsis is resolving.  Would continue to follow off of IV fluids.  3.  Pyelonephritis.  Antibiotics per primary team.  Is clinically improving.  4.  Diabetes mellitus.  Continue insulin.  Needs tight diabetes control.  Blood sugars have significantly improved during this hospitalization.  5.  Anemia.  Hemoglobin is above 10.  Will monitor.  6.   Thrombocytopenia.  Likely related to sepsis.  No evidence of thrombotic microangiopathy.   [place], DO, [place]

## 2018-10-09 NOTE — Progress Notes (Addendum)
PROGRESS NOTE    Allison Patel  TZG:[id number] DOB: 1955-12-09 DOA: 10/06/2018 PCP: Elby Showers, MD   Brief Narrative: Allison Patel is a 63 y.o. female with a history of diabetes, hypertension, hypothyroidism. Patient presented with confusion and found to have sepsis secondary to pyelonephritis. On antibiotics.   Assessment & Plan:   Principal Problem:   Severe sepsis ([place]) Active Problems:   Benign essential HTN   Hypercholesterolemia without hypertriglyceridemia   Adult hypothyroidism   Diabetes mellitus type 2, uncontrolled ([place])   Severe sepsis Present on admission with fever, tachycardia. Physiology improving. Started on empiric Cefepime, vancomycin and metronidazole; now on Cefepime. Associated elevated liver enzymes. -Continue Cefepime  Acute pyelonephritis Urine and blood cultures significant for enterobacter cloacae. -Management above  [facility] consulted and are on board. Patient is non-oliguric. Creatinine rising. UOP of 1.8 L in last 24 hours. In setting of severe sepsis in addition to ARB/metformin use as an outpatient -Nephrology recommendations -Daily metabolic panel  Anemia Acute. Unknown etiology but iron studies suggests anemia of chronic disease. Ferritin elevated. -Trend CBC -Ferritin  Thrombocytopenia Acute. Secondary to sepsis -Trend CBC  Diabetes mellitus, type 2 On sitagliptin-metformin, Lantus and Humalog as an outpatient. Last hemoglobin A1C of 14% on 6/16. -SSI -Continue Levemir 10 units BID  Essential hypertension On olmesartan-hydrochlorothiazide, clonidine, amlodipine as an outpatient. BP elevated now -Restart home amlodipine/clonidine -Hydralazine prn  Acute respiratory failure with hypoxia Chest x-ray unremarkable. No symptoms. -Wean to room air  Anxiety Patient is on Xanax as an outpatient -Continue Xanax  Hyperlipidemia On Crestor as an outpatient. Holding.  Morbid obesity Body mass index is 34.89 kg/m.     DVT prophylaxis: SCDs Code Status:   Code Status: Full Code Family Communication: None Disposition Plan: Possible CIR   Consultants:   PCCM  Nephrology  Procedures:   None  Antimicrobials:  Vancomycin (6/16)  Flagyl (6/16)  Cefepime (6/16>>    Subjective: Feels fine today.  Objective: Vitals:   10/08/18 2332 10/09/18 0343 10/09/18 0500 10/09/18 0721  BP: (!) 175/86 (!) 165/83  (!) 157/87  Pulse: (!) 101 (!) 101  (!) 102  Resp: (!) 23 (!) 27    Temp: 98 F (36.7 C) 98.7 F (37.1 C)  98.2 F (36.8 C)  TempSrc: Oral Oral  Oral  SpO2: 98% 98%    Weight:   79.7 kg   Height:        Intake/Output Summary (Last 24 hours) at 10/09/2018 1104 Last data filed at 10/09/2018 0812 Gross per 24 hour  Intake 554.96 ml  Output 1300 ml  Net -745.04 ml   Filed Weights   10/07/18 0500 10/08/18 0504 10/09/18 0500  Weight: 77.5 kg 80.3 kg 79.7 kg    Examination:  General exam: Appears calm and comfortable Respiratory system: Clear to auscultation. Respiratory effort normal. Cardiovascular system: S1 & S2 heard, RRR. No murmurs, rubs, gallops or clicks. Gastrointestinal system: Abdomen is nondistended, soft and nontender. No organomegaly or masses felt. Normal bowel sounds heard. Central nervous system: Alert and oriented. No focal neurological deficits. Extremities: No edema. No calf tenderness Skin: No cyanosis. No rashes Psychiatry: Judgement and insight appear normal. Flat affect    Data Reviewed: I have personally reviewed following labs and imaging studies  CBC: Recent Labs  Lab 10/06/18 0845 10/06/18 1115 10/07/18 0719 10/08/18 0957 10/09/18 0301  WBC 10.3  --  8.8 6.5 7.2  NEUTROABS 7.2  --   --   --   --  HGB 13.3 12.9 11.3* 10.8* 10.1*  HCT 39.1 38.0 32.7* 31.2* 29.1*  MCV 82.1  --  80.9 81.0 79.9*  PLT 65*  --  36* 38* 43*   Basic Metabolic Panel: Recent Labs  Lab 10/06/18 0845 10/06/18 1115 10/07/18 0719 10/08/18 0957 10/09/18  0301  NA 129* 132* 132* 133* 131*  K 4.2 4.1 3.5 3.3* 3.2*  CL 99  --  98 98 97*  CO2 15*  --  22 22 22   GLUCOSE 389*  --  180* 100* 98  BUN 53*  --  53* 57* 57*  CREATININE 2.89*  --  3.23* 3.71* 3.34*  CALCIUM 7.4*  --  6.6* 6.9* 7.3*  MG  --   --  1.6*  --   --   PHOS  --   --  2.1* 3.2 3.0   GFR: Estimated Creatinine Clearance: 16.1 mL/min (A) (by C-G formula based on SCr of 3.34 mg/dL (H)). Liver Function Tests: Recent Labs  Lab 10/06/18 0845 10/07/18 0719 10/08/18 0237 10/08/18 0957 10/09/18 0301  AST 67* 83* 85*  --  79*  ALT 87* 67* 61*  --  61*  ALKPHOS 193* 243* 248*  --  268*  BILITOT 1.9* 3.2* 3.5*  --  3.9*  PROT 5.7* 5.0* 4.9*  --  5.2*  ALBUMIN 2.5* 2.0* 1.8* 1.9* 1.6*  1.6*   No results for input(s): LIPASE, AMYLASE in the last 168 hours. No results for input(s): AMMONIA in the last 168 hours. Coagulation Profile: No results for input(s): INR, PROTIME in the last 168 hours. Cardiac Enzymes: No results for input(s): CKTOTAL, CKMB, CKMBINDEX, TROPONINI in the last 168 hours. BNP (last 3 results) No results for input(s): PROBNP in the last 8760 hours. HbA1C: No results for input(s): HGBA1C in the last 72 hours. CBG: Recent Labs  Lab 10/08/18 1553 10/08/18 1954 10/08/18 2344 10/09/18 0345 10/09/18 0855  GLUCAP 97 105* 119* 91 126*   Lipid Profile: No results for input(s): CHOL, HDL, LDLCALC, TRIG, CHOLHDL, LDLDIRECT in the last 72 hours. Thyroid Function Tests: No results for input(s): TSH, T4TOTAL, FREET4, T3FREE, THYROIDAB in the last 72 hours. Anemia Panel: Recent Labs    10/07/18 1426 10/08/18 0957  FERRITIN  --  740*  TIBC 167*  --   IRON 7*  --    Sepsis Labs: Recent Labs  Lab 10/06/18 0835 10/06/18 1104  LATICACIDVEN 4.2* 4.0*    Recent Results (from the past 240 hour(s))  Blood Culture (routine x 2)     Status: Abnormal   Collection Time: 10/06/18  8:45 AM   Specimen: BLOOD  Result Value Ref Range Status   Specimen  Description BLOOD LEFT ANTECUBITAL  Final   Special Requests   Final    BOTTLES DRAWN AEROBIC AND ANAEROBIC Blood Culture adequate volume   Culture  Setup Time   Final    IN BOTH AEROBIC AND ANAEROBIC BOTTLES GRAM NEGATIVE RODS CRITICAL VALUE NOTED.  VALUE IS CONSISTENT WITH PREVIOUSLY REPORTED AND CALLED VALUE.    Culture (A)  Final    ENTEROBACTER CLOACAE SUSCEPTIBILITIES PERFORMED ON PREVIOUS CULTURE WITHIN THE LAST 5 DAYS. Performed at [facility], [place] [address] [place], [place] [address]    Report Status 10/09/2018 FINAL  Final  SARS Coronavirus 2     Status: None   Collection Time: 10/06/18  8:49 AM  Result Value Ref Range Status   SARS Coronavirus 2 NOT DETECTED NOT DETECTED Final    Comment: (NOTE)  SARS-CoV-2 target nucleic acids are NOT DETECTED. The SARS-CoV-2 RNA is generally detectable in upper and lower respiratory specimens during the acute phase of infection.  Negative  results do not preclude SARS-CoV-2 infection, do not rule out co-infections with other pathogens, and should not be used as the sole basis for treatment or other patient management decisions.  Negative results must be combined with clinical observations, patient history, and epidemiological information. The expected result is Not Detected. Fact Sheet for Patients: http://www.biofiredefense.com/wp-content/uploads/2020/03/BIOFIRE-COVID -19-patients.pdf Fact Sheet for Healthcare Providers: http://www.biofiredefense.com/wp-content/uploads/2020/03/BIOFIRE-COVID -19-hcp.pdf This test is not yet approved or cleared by the [place] and  has been authorized for detection and/or diagnosis of SARS-CoV-2 by FDA under an Emergency Use Authorization (EUA).  This EUA will remain in effec t (meaning this test can be used) for the duration of  the COVID-19 declaration under Section 564(b)(1) of the Act, 21 U.S.C. section 360bbb-3(b)(1), unless the authorization is terminated or revoked sooner.  Performed at [facility], [place] [address] [place], [place] [address]   Blood Culture (routine x 2)     Status: Abnormal   Collection Time: 10/06/18  8:50 AM   Specimen: BLOOD  Result Value Ref Range Status   Specimen Description BLOOD BLOOD LEFT WRIST  Final   Special Requests   Final    BOTTLES DRAWN AEROBIC AND ANAEROBIC Blood Culture adequate volume   Culture  Setup Time   Final    GRAM NEGATIVE RODS CRITICAL RESULT CALLED TO, READ BACK BY AND VERIFIED WITH: PHRMD L SEAY @0318  10/07/18 BY S GEZAHEGN IN BOTH AEROBIC AND ANAEROBIC BOTTLES Performed at [facility], [place] [address], [place], [place] [address]    Culture ENTEROBACTER CLOACAE (A)  Final   Report Status 10/09/2018 FINAL  Final   Organism ID, Bacteria ENTEROBACTER CLOACAE  Final      Susceptibility   Enterobacter cloacae - MIC*    CEFAZOLIN RESISTANT Resistant     CEFEPIME <=1 SENSITIVE Sensitive     CEFTAZIDIME <=1 SENSITIVE Sensitive     CEFTRIAXONE <=1 SENSITIVE Sensitive     CIPROFLOXACIN <=0.25 SENSITIVE Sensitive     GENTAMICIN <=1 SENSITIVE Sensitive     IMIPENEM <=0.25 SENSITIVE Sensitive     TRIMETH/SULFA <=20 SENSITIVE Sensitive     PIP/TAZO <=4 SENSITIVE Sensitive     * ENTEROBACTER CLOACAE  Blood Culture ID Panel (Reflexed)     Status: Abnormal   Collection Time: 10/06/18  8:50 AM  Result Value Ref Range Status   Enterococcus species NOT DETECTED NOT DETECTED Final   Listeria monocytogenes NOT DETECTED NOT DETECTED Final   Staphylococcus species NOT DETECTED NOT DETECTED Final   Staphylococcus aureus (BCID) NOT DETECTED NOT DETECTED Final   Streptococcus species NOT DETECTED NOT DETECTED Final   Streptococcus agalactiae NOT DETECTED NOT DETECTED Final   Streptococcus pneumoniae NOT DETECTED NOT DETECTED Final   Streptococcus pyogenes NOT DETECTED NOT DETECTED Final   Acinetobacter baumannii NOT DETECTED NOT DETECTED Final   Enterobacteriaceae species DETECTED (A) NOT DETECTED Final     Comment: Enterobacteriaceae represent a large family of gram-negative bacteria, not a single organism. CRITICAL RESULT CALLED TO, READ BACK BY AND VERIFIED WITH: PHRMD L SEAY @0318  10/07/18 BY S GEZAHEGN    Enterobacter cloacae complex DETECTED (A) NOT DETECTED Final    Comment: CRITICAL RESULT CALLED TO, READ BACK BY AND VERIFIED WITH: PHRMD L SEAY @0318  10/07/18 BY S GEZAHEGN    Escherichia coli NOT DETECTED NOT DETECTED Final  Klebsiella oxytoca NOT DETECTED NOT DETECTED Final   Klebsiella pneumoniae NOT DETECTED NOT DETECTED Final   Proteus species NOT DETECTED NOT DETECTED Final   Serratia marcescens NOT DETECTED NOT DETECTED Final   Carbapenem resistance NOT DETECTED NOT DETECTED Final   Haemophilus influenzae NOT DETECTED NOT DETECTED Final   Neisseria meningitidis NOT DETECTED NOT DETECTED Final   Pseudomonas aeruginosa NOT DETECTED NOT DETECTED Final   Candida albicans NOT DETECTED NOT DETECTED Final   Candida glabrata NOT DETECTED NOT DETECTED Final   Candida krusei NOT DETECTED NOT DETECTED Final   Candida parapsilosis NOT DETECTED NOT DETECTED Final   Candida tropicalis NOT DETECTED NOT DETECTED Final    Comment: Performed at [facility], [place] [address], [place], [place] [address]  Urine culture     Status: Abnormal (Preliminary result)   Collection Time: 10/06/18 10:10 AM   Specimen: Urine, Random  Result Value Ref Range Status   Specimen Description URINE, RANDOM  Final   Special Requests Immunocompromised  Final   Culture (A)  Final    >=100,000 COLONIES/mL ENTEROBACTER CLOACAE CULTURE REINCUBATED FOR BETTER GROWTH Performed at [facility], [place] [address], [place], [place] [address]    Report Status PENDING  Incomplete   Organism ID, Bacteria ENTEROBACTER CLOACAE (A)  Final      Susceptibility   Enterobacter cloacae - MIC*    CEFAZOLIN >=64 RESISTANT Resistant     CEFTRIAXONE <=1 SENSITIVE Sensitive     CIPROFLOXACIN <=0.25 SENSITIVE Sensitive      GENTAMICIN <=1 SENSITIVE Sensitive     IMIPENEM 0.5 SENSITIVE Sensitive     NITROFURANTOIN 32 SENSITIVE Sensitive     TRIMETH/SULFA <=20 SENSITIVE Sensitive     PIP/TAZO <=4 SENSITIVE Sensitive     * >=100,000 COLONIES/mL ENTEROBACTER CLOACAE  MRSA PCR Screening     Status: None   Collection Time: 10/06/18  6:11 PM   Specimen: Nasal Mucosa; Nasopharyngeal  Result Value Ref Range Status   MRSA by PCR NEGATIVE NEGATIVE Final    Comment:        The GeneXpert MRSA Assay (FDA approved for NASAL specimens only), is one component of a comprehensive MRSA colonization surveillance program. It is not intended to diagnose MRSA infection nor to guide or monitor treatment for MRSA infections. Performed at [facility], [place] [address] [place], [place] [address]          Radiology Studies: US Abdomen Complete  Result Date: 10/07/2018 CLINICAL DATA:  Abnormal LFTs EXAM: ABDOMEN ULTRASOUND COMPLETE COMPARISON:  None. FINDINGS: Gallbladder: Surgically absent. Common bile duct: Diameter: 3.3 mm Liver: Increased heterogeneous echotexture with no focal mass. Portal vein is patent on color Doppler imaging with normal direction of blood flow towards the liver. IVC: No abnormality visualized. Pancreas: Visualized portion unremarkable. Spleen: Size and appearance within normal limits. Right Kidney: Length: 11.8 cm. Echogenicity within normal limits. No mass or hydronephrosis visualized. Left Kidney: Length: 11.3 cm. Contains a nonshadowing stone in the mid to lower pole. Abdominal aorta: Obscured distally by shadowing bowel gas. No aneurysm seen. Other findings: Small right pleural effusion.  Small ascites. IMPRESSION: 1. Small right pleural effusion and ascites. 2. Increased heterogeneous echotexture in the liver is nonspecific but often seen with hepatic steatosis. 3. No other abnormalities identified. Electronically Signed   By: Dorise Bullion III M.D   On: 10/07/2018 14:18        Scheduled  Meds: . insulin aspart  1-3 Units Subcutaneous Q4H  .  insulin detemir  10 Units Subcutaneous Q12H   Continuous Infusions: . ceFEPime (MAXIPIME) IV 2 g (10/09/18 0934)  . dextrose 5 % and 0.45% NaCl 10 mL/hr at 10/09/18 0600  . insulin Stopped (10/07/18 1053)     LOS: 3 days     Cordelia Poche, MD [facility] 10/09/2018, 11:04 AM  If 7PM-7AM, please contact night-coverage [email or web address]

## 2018-10-10 ENCOUNTER — Inpatient Hospital Stay (HOSPITAL_COMMUNITY): Payer: No Typology Code available for payment source

## 2018-10-10 LAB — HEPATIC FUNCTION PANEL
ALT: 51 U/L — ABNORMAL HIGH (ref 0–44)
AST: 46 U/L — ABNORMAL HIGH (ref 15–41)
Albumin: 1.5 g/dL — ABNORMAL LOW (ref 3.5–5.0)
Alkaline Phosphatase: 270 U/L — ABNORMAL HIGH (ref 38–126)
Bilirubin, Direct: 1.4 mg/dL — ABNORMAL HIGH (ref 0.0–0.2)
Indirect Bilirubin: 1.5 mg/dL — ABNORMAL HIGH (ref 0.3–0.9)
Total Bilirubin: 2.9 mg/dL — ABNORMAL HIGH (ref 0.3–1.2)
Total Protein: 5.1 g/dL — ABNORMAL LOW (ref 6.5–8.1)

## 2018-10-10 LAB — RENAL FUNCTION PANEL
Albumin: 1.4 g/dL — ABNORMAL LOW (ref 3.5–5.0)
Anion gap: 10 (ref 5–15)
BUN: 60 mg/dL — ABNORMAL HIGH (ref 8–23)
CO2: 23 mmol/L (ref 22–32)
Calcium: 7.3 mg/dL — ABNORMAL LOW (ref 8.9–10.3)
Chloride: 97 mmol/L — ABNORMAL LOW (ref 98–111)
Creatinine, Ser: 3.22 mg/dL — ABNORMAL HIGH (ref 0.44–1.00)
GFR calc Af Amer: 17 mL/min — ABNORMAL LOW (ref >60)
GFR calc non Af Amer: 15 mL/min — ABNORMAL LOW (ref >60)
Glucose, Bld: 83 mg/dL (ref 70–99)
Phosphorus: 2.1 mg/dL — ABNORMAL LOW (ref 2.5–4.6)
Potassium: 3.2 mmol/L — ABNORMAL LOW (ref 3.5–5.1)
Sodium: 130 mmol/L — ABNORMAL LOW (ref 135–145)

## 2018-10-10 LAB — GLUCOSE, CAPILLARY
Glucose-Capillary: 79 mg/dL (ref 70–99)
Glucose-Capillary: 94 mg/dL (ref 70–99)
Glucose-Capillary: 158 mg/dL — ABNORMAL HIGH (ref 70–99)
Glucose-Capillary: 326 mg/dL — ABNORMAL HIGH (ref 70–99)
Glucose-Capillary: 163 mg/dL — ABNORMAL HIGH (ref 70–99)

## 2018-10-10 LAB — URINE CULTURE: Culture: >=100000 — AB

## 2018-10-10 LAB — CBC
HCT: 26.3 % — ABNORMAL LOW (ref 36.0–46.0)
Hemoglobin: 9.2 g/dL — ABNORMAL LOW (ref 12.0–15.0)
MCH: 27.9 pg (ref 26.0–34.0)
MCHC: 35 g/dL (ref 30.0–36.0)
MCV: 79.7 fL — ABNORMAL LOW (ref 80.0–100.0)
Platelets: 58 10*3/uL — ABNORMAL LOW (ref 150–400)
RBC: 3.3 MIL/uL — ABNORMAL LOW (ref 3.87–5.11)
RDW: 14.1 % (ref 11.5–15.5)
WBC: 8.3 10*3/uL (ref 4.0–10.5)
nRBC: 0 % (ref 0.0–0.2)

## 2018-10-10 MED ORDER — INSULIN ASPART 100 UNIT/ML ~~LOC~~ SOLN
0.0000 [IU] | Freq: Three times a day (TID) | SUBCUTANEOUS | Status: DC
  Administered 2018-10-10: 7 [IU] via SUBCUTANEOUS
  Administered 2018-10-11: 12:00:00 3 [IU] via SUBCUTANEOUS
  Administered 2018-10-11: 06:00:00 2 [IU] via SUBCUTANEOUS
  Administered 2018-10-11 – 2018-10-12 (×2): 3 [IU] via SUBCUTANEOUS
  Administered 2018-10-12: 12:00:00 5 [IU] via SUBCUTANEOUS
  Administered 2018-10-12 – 2018-10-14 (×5): 2 [IU] via SUBCUTANEOUS
  Administered 2018-10-14: 3 [IU] via SUBCUTANEOUS
  Administered 2018-10-14: 07:00:00 2 [IU] via SUBCUTANEOUS
  Administered 2018-10-15 – 2018-10-17 (×6): 1 [IU] via SUBCUTANEOUS
  Administered 2018-10-17 (×2): 2 [IU] via SUBCUTANEOUS
  Administered 2018-10-18 (×2): 1 [IU] via SUBCUTANEOUS
  Administered 2018-10-18: 06:00:00 2 [IU] via SUBCUTANEOUS
  Administered 2018-10-19: 1 [IU] via SUBCUTANEOUS
  Administered 2018-10-19: 17:00:00 2 [IU] via SUBCUTANEOUS
  Administered 2018-10-19: 07:00:00 1 [IU] via SUBCUTANEOUS
  Administered 2018-10-20 – 2018-10-22 (×3): 2 [IU] via SUBCUTANEOUS

## 2018-10-10 MED ORDER — POTASSIUM CHLORIDE CRYS ER 20 MEQ PO TBCR
40.0000 meq | EXTENDED_RELEASE_TABLET | Freq: Once | ORAL | Status: AC
  Administered 2018-10-10: 40 meq via ORAL
  Filled 2018-10-10: qty 2

## 2018-10-10 NOTE — Progress Notes (Signed)
Pharmacy Antibiotic Note  Allison Patel is a 63 y.o. female admitted on 10/06/2018 with sepsis/bacteremia. Pharmacy has been consulted for cefepime dosing. Pt is now afebrile with Tmax 99.5 and WBC is WNL. Scr is slightly improved from peak but remains above baseline at 3.22 (baseline 0.8-0.9).   Plan: Continue cefepime 2gm IV Q24H F/u renal fxn, C&S, clinical status, and length of therapy  Height: 4' 11.5" (151.1 cm) Weight: 175 lb 14.8 oz (79.8 kg) IBW/kg (Calculated) : 44.35  Temp (24hrs), Avg:98.7 F (37.1 C), Min:98 F (36.7 C), Max:99.5 F (37.5 C)  Recent Labs  Lab 10/06/18 0835 10/06/18 0845 10/06/18 1104 10/07/18 0719 10/08/18 0957 10/09/18 0301 10/10/18 0306  WBC  --  10.3  --  8.8 6.5 7.2 8.3  CREATININE  --  2.89*  --  3.23* 3.71* 3.34* 3.22*  LATICACIDVEN 4.2*  --  4.0*  --   --   --   --     Estimated Creatinine Clearance: 16.8 mL/min (A) (by C-G formula based on SCr of 3.22 mg/dL (H)).    Allergies  Allergen Reactions  . Macrobid [Nitrofurantoin Macrocrystal] Rash    Antimicrobials this admission: Vanc 6/16>>6/16 Cefepime 6/16>>6/16, 6/17>> Ceftriaxone 6/16>>6/16 Flagyl x 1 6/16  Dose adjustments this admission: N/A  Microbiology results: 6/16 UCx: Enterobacter Cloacae 6/16 BCx: Enterobacter- S cefepime   Thank you for allowing pharmacy to be a part of this patient's care.  Claiborne Billings, PharmD PGY2 Cardiology Pharmacy Resident Please check AMION for all Pharmacist numbers by unit 10/10/2018 1:02 PM

## 2018-10-10 NOTE — Progress Notes (Signed)
[facility]    NEPHROLOGY PROGRESS NOTE  SUBJECTIVE: Patient seen and examined.  Currently denies headaches, fevers, chills, chest pain, shortness of breath, nausea, vomiting, diarrhea or dysuria.  The remainder of her 10 point review of systems was negative.  No acute events noted.    OBJECTIVE:  Vitals:   10/10/18 0325 10/10/18 0724  BP: 133/69 140/78  Pulse: 88 89  Resp: (!) 23 (!) 33  Temp: 98.6 F (37 C) 98 F (36.7 C)  SpO2: 91% 97%    Intake/Output Summary (Last 24 hours) at 10/10/2018 1052 Last data filed at 10/10/2018 0900 Gross per 24 hour  Intake 1297.32 ml  Output 1650 ml  Net -352.68 ml      General:  AAOx3 NAD HEENT: MMM [place] AT anicteric sclera Neck:  No JVD, no adenopathy CV:  Heart RRR  Lungs: Lung sounds with scattered rales Abd:  abd SNT/ND with normal BS GU:  Bladder non-palpable Extremities: +1 bilateral lower extremity edema. Skin:  No skin rash  MEDICATIONS:  . amLODipine  5 mg Oral Daily  . cloNIDine  0.2 mg Oral BID  . insulin aspart  1-3 Units Subcutaneous Q4H  . insulin detemir  10 Units Subcutaneous Q12H       LABS:   CBC Latest Ref Rng & Units 10/10/2018 10/09/2018 10/08/2018  WBC 4.0 - 10.5 K/uL 8.3 7.2 6.5  Hemoglobin 12.0 - 15.0 g/dL 9.2(L) 10.1(L) 10.8(L)  Hematocrit 36.0 - 46.0 % 26.3(L) 29.1(L) 31.2(L)  Platelets 150 - 400 K/uL 58(L) 43(L) 38(L)    CMP Latest Ref Rng & Units 10/10/2018 10/09/2018 10/08/2018  Glucose 70 - 99 mg/dL 83 98 100(H)  BUN 8 - 23 mg/dL 60(H) 57(H) 57(H)  Creatinine 0.44 - 1.00 mg/dL 3.22(H) 3.34(H) 3.71(H)  Sodium 135 - 145 mmol/L 130(L) 131(L) 133(L)  Potassium 3.5 - 5.1 mmol/L 3.2(L) 3.2(L) 3.3(L)  Chloride 98 - 111 mmol/L 97(L) 97(L) 98  CO2 22 - 32 mmol/L 23 22 22   Calcium 8.9 - 10.3 mg/dL 7.3(L) 7.3(L) 6.9(L)  Total Protein 6.5 - 8.1 g/dL 5.1(L) 5.2(L) 4.9(L)  Total Bilirubin 0.3 - 1.2 mg/dL 2.9(H) 3.9(H) 3.5(H)  Alkaline Phos 38 - 126 U/L 270(H) 268(H) 248(H)  AST 15 - 41 U/L  46(H) 79(H) 85(H)  ALT 0 - 44 U/L 51(H) 61(H) 61(H)    Lab Results  Component Value Date   CALCIUM 7.3 (L) 10/10/2018   CAION 0.94 (L) 10/06/2018   PHOS 2.1 (L) 10/10/2018       Component Value Date/Time   COLORURINE AMBER (A) 10/06/2018 0931   APPEARANCEUR TURBID (A) 10/06/2018 0931   LABSPEC 1.015 10/06/2018 0931   PHURINE 5.0 10/06/2018 0931   GLUCOSEU >=500 (A) 10/06/2018 0931   HGBUR MODERATE (A) 10/06/2018 0931   BILIRUBINUR NEGATIVE 10/06/2018 0931   BILIRUBINUR NEG 07/03/2017 1527   KETONESUR NEGATIVE 10/06/2018 0931   PROTEINUR >=300 (A) 10/06/2018 0931   UROBILINOGEN 0.2 07/03/2017 1527   NITRITE NEGATIVE 10/06/2018 0931   LEUKOCYTESUR MODERATE (A) 10/06/2018 0931      Component Value Date/Time   HCO3 17.4 (L) 10/06/2018 1115   TCO2 18 (L) 10/06/2018 1115   ACIDBASEDEF 8.0 (H) 10/06/2018 1115   O2SAT 65.0 10/06/2018 1115       Component Value Date/Time   IRON 7 (L) 10/07/2018 1426   TIBC 167 (L) 10/07/2018 1426   FERRITIN 740 (H) 10/08/2018 0957   IRONPCTSAT 4 (L) 10/07/2018 1426       ASSESSMENT/PLAN:  63 year old female patient with past medical history of diabetes, hypertension, and hypothyroidism presented for sepsis secondary to pyelonephritis.  1.  Baseline serum creatinine 0.85 from 03/16/2018.  2.  Acute kidney injury.  Likely on the basis of urosepsis.  Urinalysis consistent with infection.  Renal ultrasound revealed no evidence of obstruction.  There is a stone in the mid to lower left kidney.  Urine output is improving.  Serum creatinine is improving as well.  Sepsis is resolving.  Would continue to follow off of IV fluids.  3.  Pyelonephritis.  Antibiotics per primary team.  Is clinically improving.  4.  Diabetes mellitus.  Continue insulin.  Needs tight diabetes control.  Blood sugars have significantly improved during this hospitalization.  5.  Anemia.  Hemoglobin is stable.  Will monitor.  6.  Thrombocytopenia.  Likely related to  sepsis.  No evidence of thrombotic microangiopathy.   [place], DO, [place]

## 2018-10-10 NOTE — Progress Notes (Addendum)
PROGRESS NOTE    Allison Patel  TIW:[id number] DOB: Sep 03, 1955 DOA: 10/06/2018 PCP: Elby Showers, MD   Brief Narrative: Allison Patel is a 63 y.o. female with a history of diabetes, hypertension, hypothyroidism. Patient presented with confusion and found to have sepsis secondary to pyelonephritis. On antibiotics.   Assessment & Plan:   Principal Problem:   Severe sepsis ([place]) Active Problems:   Benign essential HTN   Hypercholesterolemia without hypertriglyceridemia   Adult hypothyroidism   Diabetes mellitus type 2, uncontrolled ([place])   Severe sepsis Present on admission with fever, tachycardia. Physiology improving. Started on empiric Cefepime, vancomycin and metronidazole; now on Cefepime. Associated elevated liver enzymes. -Continue Cefepime  Acute pyelonephritis Urine and blood cultures significant for enterobacter cloacae. -Management above  [facility] consulted and are on board. Patient is non-oliguric. Creatinine now dropping slowly. UOP of 1.3 L in last 24 hours. In setting of severe sepsis in addition to ARB/metformin use as an outpatient -Nephrology recommendations -Daily metabolic panel  Anemia Acute but mostly stable. Unknown etiology but iron studies suggests anemia of chronic disease. Ferritin elevated. -Trend CBC -FOBT  Thrombocytopenia Acute. Secondary to sepsis. Improving -Trend CBC  Diabetes mellitus, type 2 On sitagliptin-metformin, Lantus and Humalog as an outpatient. Last hemoglobin A1C of 14% on 6/16. -SSI -Continue Levemir 10 units BID  Essential hypertension On olmesartan-hydrochlorothiazide, clonidine, amlodipine as an outpatient. BP elevated now -Continue amlodipine/clonidine -Hydralazine prn  Acute respiratory failure with hypoxia Chest x-ray unremarkable. Tachypnea. No other symptoms. No acidosis on BMP. -Wean to room air -Repeat chest x-ray today -May need ABG  Anxiety Patient is on Xanax as an outpatient -Continue  Xanax  Hyperlipidemia On Crestor as an outpatient. Holding.  Murmur Per patient, this is not new. Possible AV murmur. Asymptomatic. Outpatient follow-up/management.  Morbid obesity Body mass index is 34.94 kg/m.   DVT prophylaxis: SCDs Code Status:   Code Status: Full Code Family Communication: None Disposition Plan: Likely CIR   Consultants:   PCCM  Nephrology  Procedures:   None  Antimicrobials:  Vancomycin (6/16)  Flagyl (6/16)  Cefepime (6/16>>    Subjective: No issues overnight. No dyspnea or chest pain. Feels better  Objective: Vitals:   10/09/18 2300 10/10/18 0325 10/10/18 0500 10/10/18 0724  BP: 124/64 133/69  140/78  Pulse: 90 88  89  Resp: (!) 27 (!) 23  (!) 33  Temp: 98.7 F (37.1 C) 98.6 F (37 C)  98 F (36.7 C)  TempSrc: Oral Oral  Oral  SpO2: 94% 91%  97%  Weight:   79.8 kg   Height:        Intake/Output Summary (Last 24 hours) at 10/10/2018 0820 Last data filed at 10/10/2018 0724 Gross per 24 hour  Intake 1297.32 ml  Output 1300 ml  Net -2.68 ml   Filed Weights   10/08/18 0504 10/09/18 0500 10/10/18 0500  Weight: 80.3 kg 79.7 kg 79.8 kg    Examination:  General exam: Appears calm and comfortable Respiratory system: Clear to auscultation. Respiratory effort normal. Cardiovascular system: S1 & S2 heard, RRR. 2/6 systolic murmur heard best at LUSB Gastrointestinal system: Abdomen is nondistended, soft and nontender. No organomegaly or masses felt. Normal bowel sounds heard. Central nervous system: Alert and oriented. No focal neurological deficits. Extremities: No edema. No calf tenderness Skin: No cyanosis. No rashes Psychiatry: Judgement and insight appear normal. Mood & affect appropriate.    Data Reviewed: I have personally reviewed following labs and imaging studies  CBC: Recent Labs  Lab 10/06/18 0845 10/06/18 1115 10/07/18 0719 10/08/18 0957 10/09/18 0301 10/10/18 0306  WBC 10.3  --  8.8 6.5 7.2 8.3   NEUTROABS 7.2  --   --   --   --   --   HGB 13.3 12.9 11.3* 10.8* 10.1* 9.2*  HCT 39.1 38.0 32.7* 31.2* 29.1* 26.3*  MCV 82.1  --  80.9 81.0 79.9* 79.7*  PLT 65*  --  36* 38* 43* 58*   Basic Metabolic Panel: Recent Labs  Lab 10/06/18 0845 10/06/18 1115 10/07/18 0719 10/08/18 0957 10/09/18 0301 10/10/18 0306  NA 129* 132* 132* 133* 131* 130*  K 4.2 4.1 3.5 3.3* 3.2* 3.2*  CL 99  --  98 98 97* 97*  CO2 15*  --  22 22 22 23   GLUCOSE 389*  --  180* 100* 98 83  BUN 53*  --  53* 57* 57* 60*  CREATININE 2.89*  --  3.23* 3.71* 3.34* 3.22*  CALCIUM 7.4*  --  6.6* 6.9* 7.3* 7.3*  MG  --   --  1.6*  --   --   --   PHOS  --   --  2.1* 3.2 3.0 2.1*   GFR: Estimated Creatinine Clearance: 16.8 mL/min (A) (by C-G formula based on SCr of 3.22 mg/dL (H)). Liver Function Tests: Recent Labs  Lab 10/06/18 0845 10/07/18 0719 10/08/18 0237 10/08/18 0957 10/09/18 0301 10/10/18 0306  AST 67* 83* 85*  --  79* 46*  ALT 87* 67* 61*  --  61* 51*  ALKPHOS 193* 243* 248*  --  268* 270*  BILITOT 1.9* 3.2* 3.5*  --  3.9* 2.9*  PROT 5.7* 5.0* 4.9*  --  5.2* 5.1*  ALBUMIN 2.5* 2.0* 1.8* 1.9* 1.6*  1.6* 1.5*  1.4*   No results for input(s): LIPASE, AMYLASE in the last 168 hours. No results for input(s): AMMONIA in the last 168 hours. Coagulation Profile: No results for input(s): INR, PROTIME in the last 168 hours. Cardiac Enzymes: No results for input(s): CKTOTAL, CKMB, CKMBINDEX, TROPONINI in the last 168 hours. BNP (last 3 results) No results for input(s): PROBNP in the last 8760 hours. HbA1C: No results for input(s): HGBA1C in the last 72 hours. CBG: Recent Labs  Lab 10/09/18 1610 10/09/18 2008 10/09/18 2304 10/10/18 0322 10/10/18 0742  GLUCAP 140* 137* 111* 79 94   Lipid Profile: No results for input(s): CHOL, HDL, LDLCALC, TRIG, CHOLHDL, LDLDIRECT in the last 72 hours. Thyroid Function Tests: No results for input(s): TSH, T4TOTAL, FREET4, T3FREE, THYROIDAB in the last 72 hours.  Anemia Panel: Recent Labs    10/07/18 1426 10/08/18 0957  FERRITIN  --  740*  TIBC 167*  --   IRON 7*  --    Sepsis Labs: Recent Labs  Lab 10/06/18 0835 10/06/18 1104  LATICACIDVEN 4.2* 4.0*    Recent Results (from the past 240 hour(s))  Blood Culture (routine x 2)     Status: Abnormal   Collection Time: 10/06/18  8:45 AM   Specimen: BLOOD  Result Value Ref Range Status   Specimen Description BLOOD LEFT ANTECUBITAL  Final   Special Requests   Final    BOTTLES DRAWN AEROBIC AND ANAEROBIC Blood Culture adequate volume   Culture  Setup Time   Final    IN BOTH AEROBIC AND ANAEROBIC BOTTLES GRAM NEGATIVE RODS CRITICAL VALUE NOTED.  VALUE IS CONSISTENT WITH PREVIOUSLY REPORTED AND CALLED VALUE.    Culture (A)  Final    ENTEROBACTER CLOACAE SUSCEPTIBILITIES  PERFORMED ON PREVIOUS CULTURE WITHIN THE LAST 5 DAYS. Performed at [facility], [place] [address] [place], [place] [address]    Report Status 10/09/2018 FINAL  Final  SARS Coronavirus 2     Status: None   Collection Time: 10/06/18  8:49 AM  Result Value Ref Range Status   SARS Coronavirus 2 NOT DETECTED NOT DETECTED Final    Comment: (NOTE) SARS-CoV-2 target nucleic acids are NOT DETECTED. The SARS-CoV-2 RNA is generally detectable in upper and lower respiratory specimens during the acute phase of infection.  Negative  results do not preclude SARS-CoV-2 infection, do not rule out co-infections with other pathogens, and should not be used as the sole basis for treatment or other patient management decisions.  Negative results must be combined with clinical observations, patient history, and epidemiological information. The expected result is Not Detected. Fact Sheet for Patients: http://www.biofiredefense.com/wp-content/uploads/2020/03/BIOFIRE-COVID -19-patients.pdf Fact Sheet for Healthcare Providers: http://www.biofiredefense.com/wp-content/uploads/2020/03/BIOFIRE-COVID -19-hcp.pdf This test is not yet  approved or cleared by the [place] and  has been authorized for detection and/or diagnosis of SARS-CoV-2 by FDA under an Emergency Use Authorization (EUA).  This EUA will remain in effec t (meaning this test can be used) for the duration of  the COVID-19 declaration under Section 564(b)(1) of the Act, 21 U.S.C. section 360bbb-3(b)(1), unless the authorization is terminated or revoked sooner. Performed at [facility], [place] [address], [place], [place] [address]   Blood Culture (routine x 2)     Status: Abnormal   Collection Time: 10/06/18  8:50 AM   Specimen: BLOOD  Result Value Ref Range Status   Specimen Description BLOOD BLOOD LEFT WRIST  Final   Special Requests   Final    BOTTLES DRAWN AEROBIC AND ANAEROBIC Blood Culture adequate volume   Culture  Setup Time   Final    GRAM NEGATIVE RODS CRITICAL RESULT CALLED TO, READ BACK BY AND VERIFIED WITH: PHRMD L SEAY @0318  10/07/18 BY S GEZAHEGN IN BOTH AEROBIC AND ANAEROBIC BOTTLES Performed at [facility], [place] [address] [place], [place] [address]    Culture ENTEROBACTER CLOACAE (A)  Final   Report Status 10/09/2018 FINAL  Final   Organism ID, Bacteria ENTEROBACTER CLOACAE  Final      Susceptibility   Enterobacter cloacae - MIC*    CEFAZOLIN RESISTANT Resistant     CEFEPIME <=1 SENSITIVE Sensitive     CEFTAZIDIME <=1 SENSITIVE Sensitive     CEFTRIAXONE <=1 SENSITIVE Sensitive     CIPROFLOXACIN <=0.25 SENSITIVE Sensitive     GENTAMICIN <=1 SENSITIVE Sensitive     IMIPENEM <=0.25 SENSITIVE Sensitive     TRIMETH/SULFA <=20 SENSITIVE Sensitive     PIP/TAZO <=4 SENSITIVE Sensitive     * ENTEROBACTER CLOACAE  Blood Culture ID Panel (Reflexed)     Status: Abnormal   Collection Time: 10/06/18  8:50 AM  Result Value Ref Range Status   Enterococcus species NOT DETECTED NOT DETECTED Final   Listeria monocytogenes NOT DETECTED NOT DETECTED Final   Staphylococcus species NOT DETECTED NOT DETECTED Final    Staphylococcus aureus (BCID) NOT DETECTED NOT DETECTED Final   Streptococcus species NOT DETECTED NOT DETECTED Final   Streptococcus agalactiae NOT DETECTED NOT DETECTED Final   Streptococcus pneumoniae NOT DETECTED NOT DETECTED Final   Streptococcus pyogenes NOT DETECTED NOT DETECTED Final   Acinetobacter baumannii NOT DETECTED NOT DETECTED Final   Enterobacteriaceae species DETECTED (A) NOT DETECTED Final    Comment: Enterobacteriaceae represent a large  family of gram-negative bacteria, not a single organism. CRITICAL RESULT CALLED TO, READ BACK BY AND VERIFIED WITH: PHRMD L SEAY @0318  10/07/18 BY S GEZAHEGN    Enterobacter cloacae complex DETECTED (A) NOT DETECTED Final    Comment: CRITICAL RESULT CALLED TO, READ BACK BY AND VERIFIED WITH: PHRMD L SEAY @0318  10/07/18 BY S GEZAHEGN    Escherichia coli NOT DETECTED NOT DETECTED Final   Klebsiella oxytoca NOT DETECTED NOT DETECTED Final   Klebsiella pneumoniae NOT DETECTED NOT DETECTED Final   Proteus species NOT DETECTED NOT DETECTED Final   Serratia marcescens NOT DETECTED NOT DETECTED Final   Carbapenem resistance NOT DETECTED NOT DETECTED Final   Haemophilus influenzae NOT DETECTED NOT DETECTED Final   Neisseria meningitidis NOT DETECTED NOT DETECTED Final   Pseudomonas aeruginosa NOT DETECTED NOT DETECTED Final   Candida albicans NOT DETECTED NOT DETECTED Final   Candida glabrata NOT DETECTED NOT DETECTED Final   Candida krusei NOT DETECTED NOT DETECTED Final   Candida parapsilosis NOT DETECTED NOT DETECTED Final   Candida tropicalis NOT DETECTED NOT DETECTED Final    Comment: Performed at [facility], [place] [address], [place], [place] [address]  Urine culture     Status: Abnormal (Preliminary result)   Collection Time: 10/06/18 10:10 AM   Specimen: Urine, Random  Result Value Ref Range Status   Specimen Description URINE, RANDOM  Final   Special Requests Immunocompromised  Final   Culture (A)  Final    >=100,000  COLONIES/mL ENTEROBACTER CLOACAE CULTURE REINCUBATED FOR BETTER GROWTH Performed at [facility], [place] [address] [place], [place] [address]    Report Status PENDING  Incomplete   Organism ID, Bacteria ENTEROBACTER CLOACAE (A)  Final      Susceptibility   Enterobacter cloacae - MIC*    CEFAZOLIN >=64 RESISTANT Resistant     CEFTRIAXONE <=1 SENSITIVE Sensitive     CIPROFLOXACIN <=0.25 SENSITIVE Sensitive     GENTAMICIN <=1 SENSITIVE Sensitive     IMIPENEM 0.5 SENSITIVE Sensitive     NITROFURANTOIN 32 SENSITIVE Sensitive     TRIMETH/SULFA <=20 SENSITIVE Sensitive     PIP/TAZO <=4 SENSITIVE Sensitive     * >=100,000 COLONIES/mL ENTEROBACTER CLOACAE  MRSA PCR Screening     Status: None   Collection Time: 10/06/18  6:11 PM   Specimen: Nasal Mucosa; Nasopharyngeal  Result Value Ref Range Status   MRSA by PCR NEGATIVE NEGATIVE Final    Comment:        The GeneXpert MRSA Assay (FDA approved for NASAL specimens only), is one component of a comprehensive MRSA colonization surveillance program. It is not intended to diagnose MRSA infection nor to guide or monitor treatment for MRSA infections. Performed at [facility], [place] [address] [place],  [address]          Radiology Studies: No results found.      Scheduled Meds: . amLODipine  5 mg Oral Daily  . cloNIDine  0.2 mg Oral BID  . insulin aspart  1-3 Units Subcutaneous Q4H  . insulin detemir  10 Units Subcutaneous Q12H  . potassium chloride  40 mEq Oral Once   Continuous Infusions: . ceFEPime (MAXIPIME) IV Stopped (10/09/18 1005)  . dextrose 5 % and 0.45% NaCl 10 mL/hr at 10/09/18 1814  . insulin Stopped (10/07/18 1053)     LOS: 4 days     Cordelia Poche, MD [facility] 10/10/2018, 8:20 AM  If 7PM-7AM, please contact night-coverage [email or web address]

## 2018-10-10 NOTE — Plan of Care (Signed)
Bladder scan shows 0 ml, pt is able to get out of bed and sit on chair. IVABX contd, pt's output since 7 am is 350 cc so far, Chest X-ray done. Denies pain, Will continue to monitor   Problem: Clinical Measurements: Goal: Will remain free from infection Outcome: Progressing   Problem: Clinical Measurements: Goal: Respiratory complications will improve Outcome: Progressing   Problem: Activity: Goal: Risk for activity intolerance will decrease Outcome: Progressing   Problem: Nutrition: Goal: Adequate nutrition will be maintained Outcome: Progressing   Problem: Safety: Goal: Ability to remain free from injury will improve Outcome: Progressing   Problem: Skin Integrity: Goal: Risk for impaired skin integrity will decrease Outcome: Progressing

## 2018-10-11 LAB — CBC
HCT: 26.3 % — ABNORMAL LOW (ref 36.0–46.0)
Hemoglobin: 8.9 g/dL — ABNORMAL LOW (ref 12.0–15.0)
MCH: 27.6 pg (ref 26.0–34.0)
MCHC: 33.8 g/dL (ref 30.0–36.0)
MCV: 81.7 fL (ref 80.0–100.0)
Platelets: 95 10*3/uL — ABNORMAL LOW (ref 150–400)
RBC: 3.22 MIL/uL — ABNORMAL LOW (ref 3.87–5.11)
RDW: 14.4 % (ref 11.5–15.5)
WBC: 9.1 10*3/uL (ref 4.0–10.5)
nRBC: 0 % (ref 0.0–0.2)

## 2018-10-11 LAB — RENAL FUNCTION PANEL
Albumin: 1.4 g/dL — ABNORMAL LOW (ref 3.5–5.0)
Anion gap: 10 (ref 5–15)
BUN: 68 mg/dL — ABNORMAL HIGH (ref 8–23)
CO2: 20 mmol/L — ABNORMAL LOW (ref 22–32)
Calcium: 7.3 mg/dL — ABNORMAL LOW (ref 8.9–10.3)
Chloride: 99 mmol/L (ref 98–111)
Creatinine, Ser: 3.27 mg/dL — ABNORMAL HIGH (ref 0.44–1.00)
GFR calc Af Amer: 17 mL/min — ABNORMAL LOW (ref >60)
GFR calc non Af Amer: 14 mL/min — ABNORMAL LOW (ref >60)
Glucose, Bld: 166 mg/dL — ABNORMAL HIGH (ref 70–99)
Phosphorus: 1.8 mg/dL — ABNORMAL LOW (ref 2.5–4.6)
Potassium: 4.1 mmol/L (ref 3.5–5.1)
Sodium: 129 mmol/L — ABNORMAL LOW (ref 135–145)

## 2018-10-11 LAB — GLUCOSE, CAPILLARY
Glucose-Capillary: 178 mg/dL — ABNORMAL HIGH (ref 70–99)
Glucose-Capillary: 228 mg/dL — ABNORMAL HIGH (ref 70–99)
Glucose-Capillary: 234 mg/dL — ABNORMAL HIGH (ref 70–99)
Glucose-Capillary: 253 mg/dL — ABNORMAL HIGH (ref 70–99)

## 2018-10-11 MED ORDER — POLYETHYLENE GLYCOL 3350 17 G PO PACK
17.0000 g | PACK | Freq: Every day | ORAL | Status: DC
  Administered 2018-10-11 – 2018-10-18 (×8): 17 g via ORAL
  Filled 2018-10-11 (×13): qty 1

## 2018-10-11 MED ORDER — AMLODIPINE BESYLATE 10 MG PO TABS
10.0000 mg | ORAL_TABLET | Freq: Every day | ORAL | Status: DC
  Administered 2018-10-12 – 2018-10-23 (×12): 10 mg via ORAL
  Filled 2018-10-11 (×12): qty 1

## 2018-10-11 NOTE — Plan of Care (Signed)

## 2018-10-11 NOTE — Progress Notes (Signed)
Unable to collect stool occult sample on AM shift, called MD and added Miralax 17gm OD, per verbal order.  Palma Holter, RN

## 2018-10-11 NOTE — Progress Notes (Signed)
[facility]    NEPHROLOGY PROGRESS NOTE  SUBJECTIVE: Patient seen and examined.  Currently denies headaches, fevers, chills, chest pain, shortness of breath, nausea, vomiting, diarrhea or dysuria.  The remainder of her 10 point review of systems was negative.  No acute events noted.    OBJECTIVE:  Vitals:   10/11/18 0718 10/11/18 1056  BP: (!) 156/66 135/71  Pulse: 100 96  Resp: (!) 24 (!) 22  Temp: 98.3 F (36.8 C) 99.1 F (37.3 C)  SpO2: 92% 98%    Intake/Output Summary (Last 24 hours) at 10/11/2018 1343 Last data filed at 10/11/2018 1243 Gross per 24 hour  Intake 720 ml  Output 850 ml  Net -130 ml      General:  AAOx3 NAD HEENT: MMM [place] AT anicteric sclera Neck:  No JVD, no adenopathy CV:  Heart RRR  Lungs: Lung sounds with scattered rales Abd:  abd SNT/ND with normal BS GU:  Bladder non-palpable Extremities: +1 bilateral lower extremity edema. Skin:  No skin rash  MEDICATIONS:  . amLODipine  5 mg Oral Daily  . cloNIDine  0.2 mg Oral BID  . insulin aspart  0-9 Units Subcutaneous TID WC       LABS:   CBC Latest Ref Rng & Units 10/11/2018 10/10/2018 10/09/2018  WBC 4.0 - 10.5 K/uL 9.1 8.3 7.2  Hemoglobin 12.0 - 15.0 g/dL 8.9(L) 9.2(L) 10.1(L)  Hematocrit 36.0 - 46.0 % 26.3(L) 26.3(L) 29.1(L)  Platelets 150 - 400 K/uL 95(L) 58(L) 43(L)    CMP Latest Ref Rng & Units 10/11/2018 10/10/2018 10/09/2018  Glucose 70 - 99 mg/dL 166(H) 83 98  BUN 8 - 23 mg/dL 68(H) 60(H) 57(H)  Creatinine 0.44 - 1.00 mg/dL 3.27(H) 3.22(H) 3.34(H)  Sodium 135 - 145 mmol/L 129(L) 130(L) 131(L)  Potassium 3.5 - 5.1 mmol/L 4.1 3.2(L) 3.2(L)  Chloride 98 - 111 mmol/L 99 97(L) 97(L)  CO2 22 - 32 mmol/L 20(L) 23 22  Calcium 8.9 - 10.3 mg/dL 7.3(L) 7.3(L) 7.3(L)  Total Protein 6.5 - 8.1 g/dL - 5.1(L) 5.2(L)  Total Bilirubin 0.3 - 1.2 mg/dL - 2.9(H) 3.9(H)  Alkaline Phos 38 - 126 U/L - 270(H) 268(H)  AST 15 - 41 U/L - 46(H) 79(H)  ALT 0 - 44 U/L - 51(H) 61(H)    Lab  Results  Component Value Date   CALCIUM 7.3 (L) 10/11/2018   CAION 0.94 (L) 10/06/2018   PHOS 1.8 (L) 10/11/2018       Component Value Date/Time   COLORURINE AMBER (A) 10/06/2018 0931   APPEARANCEUR TURBID (A) 10/06/2018 0931   LABSPEC 1.015 10/06/2018 0931   PHURINE 5.0 10/06/2018 0931   GLUCOSEU >=500 (A) 10/06/2018 0931   HGBUR MODERATE (A) 10/06/2018 0931   BILIRUBINUR NEGATIVE 10/06/2018 0931   BILIRUBINUR NEG 07/03/2017 1527   KETONESUR NEGATIVE 10/06/2018 0931   PROTEINUR >=300 (A) 10/06/2018 0931   UROBILINOGEN 0.2 07/03/2017 1527   NITRITE NEGATIVE 10/06/2018 0931   LEUKOCYTESUR MODERATE (A) 10/06/2018 0931      Component Value Date/Time   HCO3 17.4 (L) 10/06/2018 1115   TCO2 18 (L) 10/06/2018 1115   ACIDBASEDEF 8.0 (H) 10/06/2018 1115   O2SAT 65.0 10/06/2018 1115       Component Value Date/Time   IRON 7 (L) 10/07/2018 1426   TIBC 167 (L) 10/07/2018 1426   FERRITIN 740 (H) 10/08/2018 0957   IRONPCTSAT 4 (L) 10/07/2018 1426       ASSESSMENT/PLAN:    63 year old female patient with past medical history  of diabetes, hypertension, and hypothyroidism presented for sepsis secondary to pyelonephritis.  1.  Baseline serum creatinine 0.85 from 03/16/2018.  2.  Acute kidney injury.  Likely on the basis of urosepsis.  Urinalysis consistent with infection.  Renal ultrasound revealed no evidence of obstruction.  There is a stone in the mid to lower left kidney.  Urine output is improving.  Serum creatinine is improving as well.  Sepsis is resolving.  Would continue to follow off of IV fluids.  3.  Pyelonephritis.  Antibiotics per primary team.  Is clinically improving.  4.  Diabetes mellitus.  Continue insulin.  Needs tight diabetes control.  Blood sugars have significantly improved during this hospitalization.  Hold metformin  5.  Anemia.  Hemoglobin is stable.  Will monitor.  6.  Thrombocytopenia.  Likely related to sepsis.  No evidence of thrombotic  microangiopathy.  7.  Hypertension.  Increase amlodipine to 10mg  daily.   [place], DO, [place]

## 2018-10-11 NOTE — Progress Notes (Addendum)
PROGRESS NOTE    SHERLYNN TOURVILLE  AYT:[id number] DOB: Feb 03, 1956 DOA: 10/06/2018 PCP: Elby Showers, MD   Brief Narrative: Allison Patel is a 63 y.o. female with a history of diabetes, hypertension, hypothyroidism. Patient presented with confusion and found to have sepsis secondary to pyelonephritis. On antibiotics.   Assessment & Plan:   Principal Problem:   Severe sepsis ([place]) Active Problems:   Benign essential HTN   Hypercholesterolemia without hypertriglyceridemia   Adult hypothyroidism   Diabetes mellitus type 2, uncontrolled (HCC)   Severe sepsis? MODS on admit Physiology improved-narrowed to Cefepime, stop date 6/22 Follow periodic LFT--might need work-up OP if perssits Acute pyelonephritis 2/2 enterobacter cloacae. Management above AKI, ? ATN 2/2 PTA metformin/Sepsis Non oliguric--some Low level acidosis still likely renally mediated Hopeful for recovery--appreciate nephro input Anemia Acute but mostly stable. Unknown etiology but iron studies? anemia of chronic disease, FOBT never done? Trend CBC Thrombocytopenia Acute. Secondary to sepsis. Improving Trend CBC Diabetes mellitus, type 2 On sitagliptin-metformin, Lantus and Humalog as an outpatient. Last hemoglobin A1C of 14% on 6/16. SSI--continue Levemir 10 units BID--CBG's now 228--234 Essential hypertension olmesartan-hydrochlorothiazide, clonidine, amlodipine as an outpatient. BP elevated now Continue amlodipine/clonidine Hydralazine prn  Acute respiratory failure with hypoxia Chest x-ray unremarkable. Tachypnea.  Wean to room air Anxiety Patient is on Xanax as an outpatient Continue Xanax Hyperlipidemia On Crestor as an outpatient. Holding. Murmur Per patient, this is not new.Outpatient follow-up/management with [facility] Morbid obesity Body mass index is 35.03 kg/m.   DVT prophylaxis: SCDs Code Status:   Code Status: Full Code Family Communication: None Disposition Plan: Likely CIR    Consultants:   PCCM  Nephrology  Procedures:   None  Antimicrobials:  Vancomycin (6/16)  Flagyl (6/16)  Cefepime (6/16>>    Subjective:  Sleepy right now but walked 200 ft No cp No fever High fucntioning at home  Objective: Vitals:   10/11/18 0718 10/11/18 1056 10/11/18 1200 10/11/18 1512  BP: (!) 156/66 135/71  138/70  Pulse: 100 96  90  Resp: (!) 24 (!) 22 (!) 27 13  Temp: 98.3 F (36.8 C) 99.1 F (37.3 C)  98.7 F (37.1 C)  TempSrc: Oral Oral  Oral  SpO2: 92% 98%  98%  Weight:      Height:        Intake/Output Summary (Last 24 hours) at 10/11/2018 1639 Last data filed at 10/11/2018 1243 Gross per 24 hour  Intake 720 ml  Output 1500 ml  Net -780 ml   Filed Weights   10/09/18 0500 10/10/18 0500 10/11/18 0500  Weight: 79.7 kg 79.8 kg 80 kg    Examination:  Awake coherent in nad eomi ncat-flat affect cta b no added sound abd soft no rebound no guard No le edema Neuro intact   Data Reviewed: I have personally reviewed following labs and imaging studies  CBC: Recent Labs  Lab 10/06/18 0845  10/07/18 0719 10/08/18 0957 10/09/18 0301 10/10/18 0306 10/11/18 0226  WBC 10.3  --  8.8 6.5 7.2 8.3 9.1  NEUTROABS 7.2  --   --   --   --   --   --   HGB 13.3   < > 11.3* 10.8* 10.1* 9.2* 8.9*  HCT 39.1   < > 32.7* 31.2* 29.1* 26.3* 26.3*  MCV 82.1  --  80.9 81.0 79.9* 79.7* 81.7  PLT 65*  --  36* 38* 43* 58* 95*   < > = values in this interval not displayed.  Basic Metabolic Panel: Recent Labs  Lab 10/07/18 0719 10/08/18 0957 10/09/18 0301 10/10/18 0306 10/11/18 0226  NA 132* 133* 131* 130* 129*  K 3.5 3.3* 3.2* 3.2* 4.1  CL 98 98 97* 97* 99  CO2 22 22 22 23  20*  GLUCOSE 180* 100* 98 83 166*  BUN 53* 57* 57* 60* 68*  CREATININE 3.23* 3.71* 3.34* 3.22* 3.27*  CALCIUM 6.6* 6.9* 7.3* 7.3* 7.3*  MG 1.6*  --   --   --   --   PHOS 2.1* 3.2 3.0 2.1* 1.8*   GFR: Estimated Creatinine Clearance: 16.5 mL/min (A) (by C-G formula based on SCr  of 3.27 mg/dL (H)). Liver Function Tests: Recent Labs  Lab 10/06/18 0845 10/07/18 0719 10/08/18 0237 10/08/18 0957 10/09/18 0301 10/10/18 0306 10/11/18 0226  AST 67* 83* 85*  --  79* 46*  --   ALT 87* 67* 61*  --  61* 51*  --   ALKPHOS 193* 243* 248*  --  268* 270*  --   BILITOT 1.9* 3.2* 3.5*  --  3.9* 2.9*  --   PROT 5.7* 5.0* 4.9*  --  5.2* 5.1*  --   ALBUMIN 2.5* 2.0* 1.8* 1.9* 1.6*  1.6* 1.5*  1.4* 1.4*   No results for input(s): LIPASE, AMYLASE in the last 168 hours. No results for input(s): AMMONIA in the last 168 hours. Coagulation Profile: No results for input(s): INR, PROTIME in the last 168 hours. Cardiac Enzymes: No results for input(s): CKTOTAL, CKMB, CKMBINDEX, TROPONINI in the last 168 hours. BNP (last 3 results) No results for input(s): PROBNP in the last 8760 hours. HbA1C: No results for input(s): HGBA1C in the last 72 hours. CBG: Recent Labs  Lab 10/10/18 1602 10/10/18 2125 10/11/18 0600 10/11/18 1055 10/11/18 1603  GLUCAP 326* 163* 178* 228* 234*   Lipid Profile: No results for input(s): CHOL, HDL, LDLCALC, TRIG, CHOLHDL, LDLDIRECT in the last 72 hours. Thyroid Function Tests: No results for input(s): TSH, T4TOTAL, FREET4, T3FREE, THYROIDAB in the last 72 hours. Anemia Panel: No results for input(s): VITAMINB12, FOLATE, FERRITIN, TIBC, IRON, RETICCTPCT in the last 72 hours. Sepsis Labs: Recent Labs  Lab 10/06/18 0835 10/06/18 1104  LATICACIDVEN 4.2* 4.0*    Radiology Studies: Dg Chest Port 1 View  Result Date: 10/10/2018 CLINICAL DATA:  Tachypnea EXAM: PORTABLE CHEST 1 VIEW COMPARISON:  10/06/2018, 07/03/2017 FINDINGS: Low lung volumes. Streaky atelectasis or mild infiltrates at the bases. Cardiomegaly with slight central congestion. No pneumothorax. IMPRESSION: 1. Cardiomegaly with slight central congestion 2. Streaky atelectasis or minimal infiltrates at both bases Electronically Signed   By: Donavan Foil M.D.   On: 10/10/2018 15:49    Scheduled Meds: . [START ON 10/12/2018] amLODipine  10 mg Oral Daily  . cloNIDine  0.2 mg Oral BID  . insulin aspart  0-9 Units Subcutaneous TID WC   Continuous Infusions: . ceFEPime (MAXIPIME) IV 2 g (10/11/18 0943)  . dextrose 5 % and 0.45% NaCl 10 mL/hr at 10/09/18 1814     LOS: 5 days     Verneita Griffes, MD [facility] 4:46 PM  If 7PM-7AM, please contact night-coverage [email or web address]

## 2018-10-11 NOTE — Progress Notes (Signed)
Pt ambulated in a hallway almost 200 ft without distress and pain, pt is willing to be discharged to home versus SNF  Palma Holter, RN

## 2018-10-12 LAB — RENAL FUNCTION PANEL
Albumin: 1.4 g/dL — ABNORMAL LOW (ref 3.5–5.0)
Anion gap: 10 (ref 5–15)
BUN: 73 mg/dL — ABNORMAL HIGH (ref 8–23)
CO2: 20 mmol/L — ABNORMAL LOW (ref 22–32)
Calcium: 7.4 mg/dL — ABNORMAL LOW (ref 8.9–10.3)
Chloride: 98 mmol/L (ref 98–111)
Creatinine, Ser: 3.55 mg/dL — ABNORMAL HIGH (ref 0.44–1.00)
GFR calc Af Amer: 15 mL/min — ABNORMAL LOW (ref >60)
GFR calc non Af Amer: 13 mL/min — ABNORMAL LOW (ref >60)
Glucose, Bld: 244 mg/dL — ABNORMAL HIGH (ref 70–99)
Phosphorus: 2.4 mg/dL — ABNORMAL LOW (ref 2.5–4.6)
Potassium: 3.8 mmol/L (ref 3.5–5.1)
Sodium: 128 mmol/L — ABNORMAL LOW (ref 135–145)

## 2018-10-12 LAB — URINALYSIS, ROUTINE W REFLEX MICROSCOPIC
Bilirubin Urine: NEGATIVE
Glucose, UA: 150 mg/dL — AB
Ketones, ur: NEGATIVE mg/dL
Nitrite: NEGATIVE
Protein, ur: 100 mg/dL — AB
Specific Gravity, Urine: 1.01 (ref 1.005–1.030)
WBC, UA: >50 WBC/hpf — ABNORMAL HIGH (ref 0–5)
pH: 5 (ref 5.0–8.0)

## 2018-10-12 LAB — CBC
HCT: 26.5 % — ABNORMAL LOW (ref 36.0–46.0)
Hemoglobin: 9 g/dL — ABNORMAL LOW (ref 12.0–15.0)
MCH: 27.8 pg (ref 26.0–34.0)
MCHC: 34 g/dL (ref 30.0–36.0)
MCV: 81.8 fL (ref 80.0–100.0)
Platelets: 168 10*3/uL (ref 150–400)
RBC: 3.24 MIL/uL — ABNORMAL LOW (ref 3.87–5.11)
RDW: 14.6 % (ref 11.5–15.5)
WBC: 11.7 10*3/uL — ABNORMAL HIGH (ref 4.0–10.5)
nRBC: 0 % (ref 0.0–0.2)

## 2018-10-12 LAB — HEPATIC FUNCTION PANEL
ALT: 29 U/L (ref 0–44)
AST: 21 U/L (ref 15–41)
Albumin: 1.4 g/dL — ABNORMAL LOW (ref 3.5–5.0)
Alkaline Phosphatase: 239 U/L — ABNORMAL HIGH (ref 38–126)
Bilirubin, Direct: 0.6 mg/dL — ABNORMAL HIGH (ref 0.0–0.2)
Indirect Bilirubin: 1.1 mg/dL — ABNORMAL HIGH (ref 0.3–0.9)
Total Bilirubin: 1.7 mg/dL — ABNORMAL HIGH (ref 0.3–1.2)
Total Protein: 5 g/dL — ABNORMAL LOW (ref 6.5–8.1)

## 2018-10-12 LAB — GLUCOSE, CAPILLARY
Glucose-Capillary: 242 mg/dL — ABNORMAL HIGH (ref 70–99)
Glucose-Capillary: 252 mg/dL — ABNORMAL HIGH (ref 70–99)
Glucose-Capillary: 225 mg/dL — ABNORMAL HIGH (ref 70–99)
Glucose-Capillary: 200 mg/dL — ABNORMAL HIGH (ref 70–99)
Glucose-Capillary: 200 mg/dL — ABNORMAL HIGH (ref 70–99)

## 2018-10-12 MED ORDER — ALPRAZOLAM 0.25 MG PO TABS: 0.2500 mg | ORAL_TABLET | Freq: Every day | ORAL | Status: DC | PRN

## 2018-10-12 MED ORDER — INSULIN DETEMIR 100 UNIT/ML ~~LOC~~ SOLN
5.0000 [IU] | Freq: Two times a day (BID) | SUBCUTANEOUS | Status: DC
  Administered 2018-10-12 – 2018-10-13 (×3): 5 [IU] via SUBCUTANEOUS
  Filled 2018-10-12 (×4): qty 0.05

## 2018-10-12 NOTE — Plan of Care (Signed)

## 2018-10-12 NOTE — Progress Notes (Signed)
Bladder scan not done, patient up to void

## 2018-10-12 NOTE — Progress Notes (Signed)
Bladder scan complete  Not detectable urine post void

## 2018-10-12 NOTE — Progress Notes (Addendum)
PROGRESS NOTE    OUMOU SMEAD  UUV:[id number] DOB: [birth year] DOA: 10/06/2018 PCP: Elby Showers, MD   Brief Narrative: KANIJA REMMEL is a 63 y.o. female with a history of diabetes, hypertension, hypothyroidism. Patient presented with confusion and found to have sepsis secondary to pyelonephritis. On antibiotics.   Assessment & Plan:   Principal Problem:   Severe sepsis ([place]) Active Problems:   Benign essential HTN   Hypercholesterolemia without hypertriglyceridemia   Adult hypothyroidism   Diabetes mellitus type 2, uncontrolled (HCC)   Severe sepsis? MODS on admit Physiology improved-narrowed to Cefepime, stopped on 6/22 Acute pyelonephritis 2/2 enterobacter cloacae. Management above AKI, ? ATN 2/2 PTA metformin/Sepsis Mild hypervolemic hyponatremia Non oliguric--some Low level acidosis still likely renally mediated Hopeful for recovery--appreciate nephro input--- seems like plateau phase and hopeful for recovery in 24 to 48 hours Watch weight--  may be drinking more than she is eating and might benefit from Lasix but will defer to nephrologist regarding the same Urine analysis ordered this a.m.-follow Anemia Acute but mostly stable. Unknown etiology but iron studies? anemia of chronic disease, FOBT pending at this time as she has not had a stool Trend CBC Thrombocytopenia Acute.  Likely component of this being sepsis related See below discussion Diabetes mellitus, type 2 On sitagliptin-metformin, Lantus and Humalog as an outpatient. Last hemoglobin A1C of 14% on 6/16. SSI--resuming lower dose of Levemir 5 units twice daily--we will adjust back to home dosing dependent on labs and trends Essential hypertension olmesartan-hydrochlorothiazide, clonidine, amlodipine as an outpatient. Continue amlodipine 10 mg/clonidine 0.2 twice daily Hydralazine prn  Acute respiratory failure with hypoxia Chest x-ray unremarkable. Tachypnea.  Wean to room air Anxiety Patient is  on Xanax as an outpatient Continue Xanax 0.25 twice daily as needed Hyperlipidemia Transaminitis Patient tells me she has had in the past cholecystectomy-as her LFTs are improving would probably get repeat labs as an outpatient she does have a habitus that could also be suspicious for Karlene Lineman--- outpatient work-up with hepatitis panel if LFTs still up On Crestor as an outpatient. Holding. Murmur Per patient, this is not new.Outpatient follow-up/management with [facility] Morbid obesity Body mass index is 35.29 kg/m.   DVT prophylaxis: SCDs Code Status:   Code Status: Full Code Family Communication: 10 min discussion with daughter--all q answered Disposition Plan: Likely CIR   Consultants:   PCCM  Nephrology  Procedures:   None  Antimicrobials:  Vancomycin (6/16)  Flagyl (6/16)  Cefepime (6/16>>    Subjective:  Doing fair Feels close to normal Passing some urine No chest pain no fever Asking about leaving hospital  Objective: Vitals:   10/12/18 0000 10/12/18 0300 10/12/18 0500 10/12/18 0808  BP: 136/66 (!) 151/64  125/61  Pulse: 93 96  95  Resp: (!) 34 (!) 34  (!) 30  Temp: 98.8 F (37.1 C) 99 F (37.2 C)  99.2 F (37.3 C)  TempSrc: Oral Oral  Oral  SpO2: 98% 99%  91%  Weight:   80.6 kg   Height:        Intake/Output Summary (Last 24 hours) at 10/12/2018 1057 Last data filed at 10/12/2018 0809 Gross per 24 hour  Intake 600 ml  Output 1400 ml  Net -800 ml   Filed Weights   10/10/18 0500 10/11/18 0500 10/12/18 0500  Weight: 79.8 kg 80 kg 80.6 kg    Examination:  Flat affect pleasant no distress EOMI NCAT Chest clear Abdomen soft no rebound S1-S2 mild tachycardia Power is 5/5 No lower  extremity edema Skin is intact   Data Reviewed: I have personally reviewed following labs and imaging studies  CBC: Recent Labs  Lab 10/06/18 0845  10/08/18 0957 10/09/18 0301 10/10/18 0306 10/11/18 0226 10/12/18 0231  WBC 10.3   < > 6.5 7.2 8.3 9.1 11.7*   NEUTROABS 7.2  --   --   --   --   --   --   HGB 13.3   < > 10.8* 10.1* 9.2* 8.9* 9.0*  HCT 39.1   < > 31.2* 29.1* 26.3* 26.3* 26.5*  MCV 82.1   < > 81.0 79.9* 79.7* 81.7 81.8  PLT 65*   < > 38* 43* 58* 95* 168   < > = values in this interval not displayed.   Basic Metabolic Panel: Recent Labs  Lab 10/07/18 0719 10/08/18 0957 10/09/18 0301 10/10/18 0306 10/11/18 0226 10/12/18 0231  NA 132* 133* 131* 130* 129* 128*  K 3.5 3.3* 3.2* 3.2* 4.1 3.8  CL 98 98 97* 97* 99 98  CO2 22 22 22 23  20* 20*  GLUCOSE 180* 100* 98 83 166* 244*  BUN 53* 57* 57* 60* 68* 73*  CREATININE 3.23* 3.71* 3.34* 3.22* 3.27* 3.55*  CALCIUM 6.6* 6.9* 7.3* 7.3* 7.3* 7.4*  MG 1.6*  --   --   --   --   --   PHOS 2.1* 3.2 3.0 2.1* 1.8* 2.4*   GFR: Estimated Creatinine Clearance: 15.3 mL/min (A) (by C-G formula based on SCr of 3.55 mg/dL (H)). Liver Function Tests: Recent Labs  Lab 10/07/18 0719 10/08/18 0237 10/08/18 0957 10/09/18 0301 10/10/18 0306 10/11/18 0226 10/12/18 0231  AST 83* 85*  --  79* 46*  --  21  ALT 67* 61*  --  61* 51*  --  29  ALKPHOS 243* 248*  --  268* 270*  --  239*  BILITOT 3.2* 3.5*  --  3.9* 2.9*  --  1.7*  PROT 5.0* 4.9*  --  5.2* 5.1*  --  5.0*  ALBUMIN 2.0* 1.8* 1.9* 1.6*  1.6* 1.5*  1.4* 1.4* 1.4*  1.4*   No results for input(s): LIPASE, AMYLASE in the last 168 hours. No results for input(s): AMMONIA in the last 168 hours. Coagulation Profile: No results for input(s): INR, PROTIME in the last 168 hours. Cardiac Enzymes: No results for input(s): CKTOTAL, CKMB, CKMBINDEX, TROPONINI in the last 168 hours. BNP (last 3 results) No results for input(s): PROBNP in the last 8760 hours. HbA1C: No results for input(s): HGBA1C in the last 72 hours. CBG: Recent Labs  Lab 10/11/18 0600 10/11/18 1055 10/11/18 1603 10/11/18 2204 10/12/18 0609  GLUCAP 178* 228* 234* 253* 242*   Lipid Profile: No results for input(s): CHOL, HDL, LDLCALC, TRIG, CHOLHDL, LDLDIRECT in the  last 72 hours. Thyroid Function Tests: No results for input(s): TSH, T4TOTAL, FREET4, T3FREE, THYROIDAB in the last 72 hours. Anemia Panel: No results for input(s): VITAMINB12, FOLATE, FERRITIN, TIBC, IRON, RETICCTPCT in the last 72 hours. Sepsis Labs: Recent Labs  Lab 10/06/18 0835 10/06/18 1104  LATICACIDVEN 4.2* 4.0*    Radiology Studies: No results found. Scheduled Meds: . amLODipine  10 mg Oral Daily  . cloNIDine  0.2 mg Oral BID  . insulin aspart  0-9 Units Subcutaneous TID WC  . polyethylene glycol  17 g Oral Daily   Continuous Infusions: . ceFEPime (MAXIPIME) IV 2 g (10/12/18 0951)     LOS: 6 days     Verneita Griffes, MD [facility] 10:57  AM  If 7PM-7AM, please contact night-coverage [email or web address]

## 2018-10-12 NOTE — Progress Notes (Addendum)
Inpatient Diabetes Program Recommendations  AACE/ADA: New Consensus Statement on Inpatient Glycemic Control (2015)  Target Ranges:  Prepandial:   less than 140 mg/dL      Peak postprandial:   less than 180 mg/dL (1-2 hours)      Critically ill patients:  140 - 180 mg/dL   Lab Results  Component Value Date   GLUCAP 242 (H) 10/12/2018   HGBA1C 14.0 (H) 10/06/2018    Review of Glycemic Control Results for Allison Patel, Allison Patel" (MRN [id number]) as of 10/12/2018 07:44  Ref. Range 10/11/2018 16:03 10/11/2018 22:04 10/12/2018 06:09  Glucose-Capillary Latest Ref Range: 70 - 99 mg/dL 234 (H) 253 (H) 242 (H)   Diabetes history:  Outpatient Diabetes medications: Lantus 20 units daily, Humalog 10 units bid (per patient she varies dose), Sitagliptin-Metformin 50-1000 mg daily Current orders for Inpatient glycemic control: Novolog sensitive tid with meals and HS  Inpatient Diabetes Program Recommendations:    Blood sugars trending up>goal. Please consider restarting a portion of basal insulin.  Consider adding Lantus 10 units once daily.    Thanks,  Adah Perl, RN, BC-ADM Inpatient Diabetes Coordinator Pager [phone] (8a-5p)

## 2018-10-12 NOTE — Progress Notes (Signed)
[facility]    NEPHROLOGY PROGRESS NOTE  SUBJECTIVE: Patient seen and examined.  Currently denies headaches, fevers, chills, chest pain, shortness of breath, nausea, vomiting, diarrhea or dysuria.  The remainder of her 10 point review of systems was negative.  No acute events noted.    OBJECTIVE:  Vitals:   10/12/18 0808 10/12/18 1144  BP: 125/61 (!) 142/65  Pulse: 95 (!) 108  Resp: (!) 30 (!) 35  Temp: 99.2 F (37.3 C) 99.6 F (37.6 C)  SpO2: 91% 93%    Intake/Output Summary (Last 24 hours) at 10/12/2018 1448 Last data filed at 10/12/2018 1143 Gross per 24 hour  Intake 360 ml  Output 1400 ml  Net -1040 ml      General:  AAOx3 NAD HEENT: MMM [place] AT anicteric sclera Neck:  No JVD, no adenopathy CV:  Heart RRR  Lungs: Lung sounds with scattered rales Abd:  abd SNT/ND with normal BS GU:  Bladder non-palpable Extremities: +1 bilateral lower extremity edema. Skin:  No skin rash  MEDICATIONS:  . amLODipine  10 mg Oral Daily  . cloNIDine  0.2 mg Oral BID  . insulin aspart  0-9 Units Subcutaneous TID WC  . insulin detemir  5 Units Subcutaneous BID  . polyethylene glycol  17 g Oral Daily       LABS:   CBC Latest Ref Rng & Units 10/12/2018 10/11/2018 10/10/2018  WBC 4.0 - 10.5 K/uL 11.7(H) 9.1 8.3  Hemoglobin 12.0 - 15.0 g/dL 9.0(L) 8.9(L) 9.2(L)  Hematocrit 36.0 - 46.0 % 26.5(L) 26.3(L) 26.3(L)  Platelets 150 - 400 K/uL 168 95(L) 58(L)    CMP Latest Ref Rng & Units 10/12/2018 10/11/2018 10/10/2018  Glucose 70 - 99 mg/dL 244(H) 166(H) 83  BUN 8 - 23 mg/dL 73(H) 68(H) 60(H)  Creatinine 0.44 - 1.00 mg/dL 3.55(H) 3.27(H) 3.22(H)  Sodium 135 - 145 mmol/L 128(L) 129(L) 130(L)  Potassium 3.5 - 5.1 mmol/L 3.8 4.1 3.2(L)  Chloride 98 - 111 mmol/L 98 99 97(L)  CO2 22 - 32 mmol/L 20(L) 20(L) 23  Calcium 8.9 - 10.3 mg/dL 7.4(L) 7.3(L) 7.3(L)  Total Protein 6.5 - 8.1 g/dL 5.0(L) - 5.1(L)  Total Bilirubin 0.3 - 1.2 mg/dL 1.7(H) - 2.9(H)  Alkaline Phos 38 - 126  U/L 239(H) - 270(H)  AST 15 - 41 U/L 21 - 46(H)  ALT 0 - 44 U/L 29 - 51(H)    Lab Results  Component Value Date   CALCIUM 7.4 (L) 10/12/2018   CAION 0.94 (L) 10/06/2018   PHOS 2.4 (L) 10/12/2018       Component Value Date/Time   COLORURINE YELLOW 10/12/2018 1115   APPEARANCEUR CLOUDY (A) 10/12/2018 1115   LABSPEC 1.010 10/12/2018 1115   PHURINE 5.0 10/12/2018 1115   GLUCOSEU 150 (A) 10/12/2018 1115   HGBUR MODERATE (A) 10/12/2018 1115   BILIRUBINUR NEGATIVE 10/12/2018 1115   BILIRUBINUR NEG 07/03/2017 1527   KETONESUR NEGATIVE 10/12/2018 1115   PROTEINUR 100 (A) 10/12/2018 1115   UROBILINOGEN 0.2 07/03/2017 1527   NITRITE NEGATIVE 10/12/2018 1115   LEUKOCYTESUR LARGE (A) 10/12/2018 1115      Component Value Date/Time   HCO3 17.4 (L) 10/06/2018 1115   TCO2 18 (L) 10/06/2018 1115   ACIDBASEDEF 8.0 (H) 10/06/2018 1115   O2SAT 65.0 10/06/2018 1115       Component Value Date/Time   IRON 7 (L) 10/07/2018 1426   TIBC 167 (L) 10/07/2018 1426   FERRITIN 740 (H) 10/08/2018 0957   IRONPCTSAT 4 (L) 10/07/2018  71       ASSESSMENT/PLAN:    63 year old female patient with past medical history of diabetes, hypertension, and hypothyroidism presented for sepsis secondary to pyelonephritis.  1.  Baseline serum creatinine 0.85 from 03/16/2018.  2.  Acute kidney injury.  Likely on the basis of urosepsis.  Urinalysis consistent with infection.  Renal ultrasound revealed no evidence of obstruction.  There is a stone in the mid to lower left kidney.  Urine output is excellent.  Serum creatinine is increased slightly today - will monitor.  Sepsis is resolving.  Would continue to follow off of IV fluids.  3.  Pyelonephritis.  Antibiotics per primary team.  Is clinically improving.  4.  Diabetes mellitus.  Continue insulin.  Needs tight diabetes control.  Blood sugars have significantly improved during this hospitalization.  Hold metformin  5.  Anemia.  Hemoglobin is stable.  Will  monitor.  6.  Thrombocytopenia.  Likely related to sepsis.  No evidence of thrombotic microangiopathy.  7.  Hypertension.  Increased amlodipine to 10mg  daily.   [place], DO, [place]

## 2018-10-12 NOTE — Progress Notes (Addendum)
Bladder scan not done.  Patient up to the [facility] and voided

## 2018-10-12 NOTE — Progress Notes (Signed)
Physical Therapy Treatment Patient Details Name: Allison Patel MRN: [id number] DOB: 05-Aug-1955 Today's Date: 10/12/2018    History of Present Illness 63 year old woman with DM, HTN, prior gluteal abscess presenting with confusion since Sunday ~3 days. hx DM, HTN p/w severe sepsis secondary to enterobacter pyelnonephritis with hematogenous spread.    PT Comments    Pt admitted with above diagnosis. Pt currently with functional limitations due to the deficits listed below (see PT Problem List). Pt was able to ambulate with RW in hallway with better balance than last visit but was still fatigued and needed to rest at end of walk. Very poor endurance and still with some balance impairments.  Pt states that her husband will help her at home at d/c and wants to go home.  If husband can be there, agree with HHPT but ifnot needs CIR prior to d/c home.  Pt will benefit from skilled PT to increase their independence and safety with mobility to allow discharge to the venue listed below.     Follow Up Recommendations  Home health PT;Supervision/Assistance - 24 hour(if husband can provide 24 hour care -per pt he is taking off work for a few weeks when she first goes home. )     Equipment Recommendations  Other (comment)(rollator)    Recommendations for Other Services       Precautions / Restrictions Precautions Precautions: Fall Restrictions Weight Bearing Restrictions: No    Mobility  Bed Mobility Overal bed mobility: Needs Assistance Bed Mobility: Supine to Sit Rolling: Min guard   Supine to sit: Supervision        Transfers Overall transfer level: Needs assistance Equipment used: Rolling walker (2 wheeled) Transfers: Sit to/from [facility] Sit to Stand: Min guard         General transfer comment: No assist but needed cues for hand placement  Ambulation/Gait Ambulation/Gait assistance: Min assist;+2 safety/equipment;Min guard Gait Distance (Feet): 110  Feet Assistive device: Rolling walker (2 wheeled) Gait Pattern/deviations: Step-through pattern;Decreased stride length;Staggering left;Wide base of support   Gait velocity interpretation: <1.31 ft/sec, indicative of household ambulator General Gait Details: Pt was able to progress ambulation to hallway.  Used RW and was steady overall with RW needing cues to incr speed at times as well as to steer RW.  Still has several LOB unless she had bil UE support on RW.     Stairs             Wheelchair Mobility    Modified Rankin (Stroke Patients Only)       Balance Overall balance assessment: Needs assistance Sitting-balance support: No upper extremity supported;Feet supported Sitting balance-Leahy Scale: Fair     Standing balance support: No upper extremity supported;Single extremity supported;During functional activity Standing balance-Leahy Scale: Poor Standing balance comment: requires UE support and min A for standing balance.  Pt wanted to rest after her walk due to fatigued then got back up and stood at sink and brushed teeth.  Was demonstrating fatigue after standing and washing hands.                             Cognition Arousal/Alertness: Awake/alert Behavior During Therapy: Flat affect Overall Cognitive Status: Impaired/Different from baseline Area of Impairment: Attention;Memory;Following commands;Awareness;Problem solving                 Orientation Level: Disoriented to;Place;Time;Situation Current Attention Level: Selective Memory: Decreased short-term memory Following Commands: Follows multi-step commands  inconsistently;Follows multi-step commands with increased time Safety/Judgement: Decreased awareness of deficits;Decreased awareness of safety Awareness: Intellectual Problem Solving: Decreased initiation;Difficulty sequencing;Requires verbal cues General Comments: See OT note for short blessed test      Exercises General Exercises -  Lower Extremity Ankle Circles/Pumps: AROM;Both;10 reps;Seated Long Arc Quad: AROM;Both;10 reps;Seated Hip Flexion/Marching: AROM;Both;5 reps;Seated    General Comments General comments (skin integrity, edema, etc.): 144/63, 96% on RA, 100-115 bpm.       Pertinent Vitals/Pain Pain Assessment: No/denies pain    Home Living                      Prior Function            PT Goals (current goals can now be found in the care plan section) Acute Rehab PT Goals Patient Stated Goal: to go home Progress towards PT goals: Progressing toward goals    Frequency    Min 3X/week      PT Plan Discharge plan needs to be updated    Co-evaluation PT/OT/SLP Co-Evaluation/Treatment: Yes Reason for Co-Treatment: For patient/therapist safety PT goals addressed during session: Mobility/safety with mobility        AM-PAC PT "6 Clicks" Mobility   Outcome Measure  Help needed turning from your back to your side while in a flat bed without using bedrails?: A Little Help needed moving from lying on your back to sitting on the side of a flat bed without using bedrails?: A Little Help needed moving to and from a bed to a chair (including a wheelchair)?: A Little Help needed standing up from a chair using your arms (e.g., wheelchair or bedside chair)?: A Little Help needed to walk in hospital room?: A Little Help needed climbing 3-5 steps with a railing? : A Lot 6 Click Score: 17    End of Session Equipment Utilized During Treatment: Gait belt Activity Tolerance: Patient limited by fatigue Patient left: with call bell/phone within reach;in chair;with chair alarm set Nurse Communication: Mobility status PT Visit Diagnosis: Muscle weakness (generalized) (M62.81);Unsteadiness on feet (R26.81)     Time: 1771-1657 PT Time Calculation (min) (ACUTE ONLY): 23 min  Charges:  $Gait Training: 8-22 mins                     [place] Pager:   [phone]  Office:  [place] 10/12/2018, 1:06 PM

## 2018-10-12 NOTE — Progress Notes (Addendum)
Occupational Therapy Treatment Patient Details Name: Allison Patel MRN: [id number] DOB: May 30, 1955 Today's Date: 10/12/2018    History of present illness 63 year old woman with DM, HTN, prior gluteal abscess presenting with confusion since Sunday ~3 days. hx DM, HTN p/w severe sepsis secondary to enterobacter pyelnonephritis with hematogenous spread.   OT comments  Patient progressing well.  Continues to require min guard for transfers for safety, cueing for hand placement.  Engaged in grooming tasks standing at sink after mobility, requiring seated rest break before standing at sink.  She requires min guard for dynamic balance and cueing for safety, preference for forward lean on sink due to decreased activity tolerance.  Short blessed test completed to re-assess cognition, improved but continues to have questionable impairments, specifically in [facility] scoring 6/28.  Plan for next session: LB ADLs and cognition. If her spouse can provide 24/7 support (pt report he will have to take time off work), then [facility] will be appropriate and DC plan updated.    Follow Up Recommendations  Home health OT;Supervision/Assistance - 24 hour(pt reports spouse can provide 24/7--need to confirm)    Equipment Recommendations  3 in 1 bedside commode    Recommendations for Other Services      Precautions / Restrictions Precautions Precautions: Fall Restrictions Weight Bearing Restrictions: No       Mobility Bed Mobility Overal bed mobility: Needs Assistance Bed Mobility: Supine to Sit Rolling: Min guard   Supine to sit: Supervision     General bed mobility comments: close supervision for safety, no physical assist required  Transfers Overall transfer level: Needs assistance Equipment used: Rolling walker (2 wheeled) Transfers: Sit to/from Stand Sit to Stand: Min guard         General transfer comment: No assist but needed cues for hand placement    Balance Overall balance assessment: Needs  assistance Sitting-balance support: No upper extremity supported;Feet supported Sitting balance-Leahy Scale: Fair     Standing balance support: No upper extremity supported;During functional activity Standing balance-Leahy Scale: Poor Standing balance comment: pt reliant on B UE support during mobility, preference to UE support as leaning foward on sink to support self                            ADL either performed or assessed with clinical judgement   ADL Overall ADL's : Needs assistance/impaired     Grooming: Wash/dry hands;Oral care;Min guard;Sitting Grooming Details (indicate cue type and reason): min guard for safety/balance especially when reaching outside BOS to retrieve items; limited tolerace and prefernce to lean on sink when fatigued                 Toilet Transfer: Min Fish farm manager Details (indicate cue type and reason): simulated to recliner, balance and safety          Functional mobility during ADLs: Min guard;Minimal assistance;Rolling walker General ADL Comments: pt limited by generalized weakness and cognition      Vision       Perception     Praxis      Cognition Arousal/Alertness: Awake/alert Behavior During Therapy: Flat affect Overall Cognitive Status: Impaired/Different from baseline Area of Impairment: Attention;Memory;Following commands;Awareness;Problem solving                 Orientation Level: Disoriented to;Place;Time;Situation Current Attention Level: Selective Memory: Decreased short-term memory Following Commands: Follows multi-step commands inconsistently;Follows multi-step commands with increased time Safety/Judgement: Decreased awareness of deficits;Decreased  awareness of safety Awareness: Emergent Problem Solving: Decreased initiation;Difficulty sequencing;Requires verbal cues General Comments: pt continues to require increased time for processing and response; short blessed test re-assessment  6/28 --limitation significantly in STM but continues to reveal questionable impairment        Exercises Exercises: General Lower Extremity General Exercises - Lower Extremity Ankle Circles/Pumps: AROM;Both;10 reps;Seated Long Arc Quad: AROM;Both;10 reps;Seated Hip Flexion/Marching: AROM;Both;5 reps;Seated   Shoulder Instructions       General Comments 144/63, 96% on RA, 100-115 bpm.     Pertinent Vitals/ Pain       Pain Assessment: No/denies pain  Home Living                                          Prior Functioning/Environment              Frequency  Min 2X/week        Progress Toward Goals  OT Goals(current goals can now be found in the care plan section)  Progress towards OT goals: Progressing toward goals  Acute Rehab OT Goals Patient Stated Goal: to go home OT Goal Formulation: With patient  Plan Discharge plan needs to be updated;Frequency remains appropriate    Co-evaluation    PT/OT/SLP Co-Evaluation/Treatment: Yes Reason for Co-Treatment: For patient/therapist safety;To address functional/ADL transfers PT goals addressed during session: Mobility/safety with mobility OT goals addressed during session: ADL's and self-care      AM-PAC OT "6 Clicks" Daily Activity     Outcome Measure   Help from another person eating meals?: None Help from another person taking care of personal grooming?: A Little Help from another person toileting, which includes using toliet, bedpan, or urinal?: A Lot Help from another person bathing (including washing, rinsing, drying)?: A Little Help from another person to put on and taking off regular upper body clothing?: A Little Help from another person to put on and taking off regular lower body clothing?: A Lot 6 Click Score: 17    End of Session Equipment Utilized During Treatment: Rolling walker;Gait belt  OT Visit Diagnosis: Unsteadiness on feet (R26.81);Other symptoms and signs involving  cognitive function;Muscle weakness (generalized) (M62.81)   Activity Tolerance Patient tolerated treatment well   Patient Left in chair;with call bell/phone within reach;with chair alarm set   Nurse Communication Mobility status        Time: 1660-6301 OT Time Calculation (min): 23 min  Charges: OT General Charges $OT Visit: 1 Visit OT Treatments $Self Care/Home Management : 8-22 mins  Delight Stare, [place] Pager [phone] Office [phone]    Delight Stare 10/12/2018, 1:39 PM

## 2018-10-13 ENCOUNTER — Encounter (HOSPITAL_COMMUNITY): Payer: Self-pay

## 2018-10-13 LAB — PROTEIN / CREATININE RATIO, URINE
Creatinine, Urine: 64.82 mg/dL
Protein Creatinine Ratio: 3.24 mg/mg{Cre} — ABNORMAL HIGH (ref 0.00–0.15)
Total Protein, Urine: 210 mg/dL

## 2018-10-13 LAB — CBC
HCT: 25.8 % — ABNORMAL LOW (ref 36.0–46.0)
Hemoglobin: 8.7 g/dL — ABNORMAL LOW (ref 12.0–15.0)
MCH: 27.6 pg (ref 26.0–34.0)
MCHC: 33.7 g/dL (ref 30.0–36.0)
MCV: 81.9 fL (ref 80.0–100.0)
Platelets: 211 10*3/uL (ref 150–400)
RBC: 3.15 MIL/uL — ABNORMAL LOW (ref 3.87–5.11)
RDW: 15.1 % (ref 11.5–15.5)
WBC: 12.4 10*3/uL — ABNORMAL HIGH (ref 4.0–10.5)
nRBC: 0 % (ref 0.0–0.2)

## 2018-10-13 LAB — RENAL FUNCTION PANEL
Albumin: 1.3 g/dL — ABNORMAL LOW (ref 3.5–5.0)
Anion gap: 9 (ref 5–15)
BUN: 77 mg/dL — ABNORMAL HIGH (ref 8–23)
CO2: 20 mmol/L — ABNORMAL LOW (ref 22–32)
Calcium: 7.5 mg/dL — ABNORMAL LOW (ref 8.9–10.3)
Chloride: 99 mmol/L (ref 98–111)
Creatinine, Ser: 4.04 mg/dL — ABNORMAL HIGH (ref 0.44–1.00)
GFR calc Af Amer: 13 mL/min — ABNORMAL LOW (ref >60)
GFR calc non Af Amer: 11 mL/min — ABNORMAL LOW (ref >60)
Glucose, Bld: 197 mg/dL — ABNORMAL HIGH (ref 70–99)
Phosphorus: 3.4 mg/dL (ref 2.5–4.6)
Potassium: 4.3 mmol/L (ref 3.5–5.1)
Sodium: 128 mmol/L — ABNORMAL LOW (ref 135–145)

## 2018-10-13 LAB — GLUCOSE, CAPILLARY
Glucose-Capillary: 186 mg/dL — ABNORMAL HIGH (ref 70–99)
Glucose-Capillary: 183 mg/dL — ABNORMAL HIGH (ref 70–99)
Glucose-Capillary: 187 mg/dL — ABNORMAL HIGH (ref 70–99)

## 2018-10-13 MED ORDER — SODIUM CHLORIDE 0.9 % IV SOLN
INTRAVENOUS | Status: DC
  Administered 2018-10-13 – 2018-10-14 (×2): via INTRAVENOUS

## 2018-10-13 MED ORDER — SODIUM CHLORIDE 0.9 % IV SOLN
1.0000 g | INTRAVENOUS | Status: DC
  Administered 2018-10-13 – 2018-10-14 (×2): 1 g via INTRAVENOUS
  Filled 2018-10-13 (×2): qty 1

## 2018-10-13 MED ORDER — INSULIN DETEMIR 100 UNIT/ML ~~LOC~~ SOLN
8.0000 [IU] | Freq: Two times a day (BID) | SUBCUTANEOUS | Status: DC
  Administered 2018-10-13 – 2018-10-14 (×2): 8 [IU] via SUBCUTANEOUS
  Filled 2018-10-13 (×3): qty 0.08

## 2018-10-13 NOTE — Progress Notes (Addendum)
Inpatient Diabetes Program Recommendations  AACE/ADA: New Consensus Statement on Inpatient Glycemic Control (2015)  Target Ranges:  Prepandial:   less than 140 mg/dL      Peak postprandial:   less than 180 mg/dL (1-2 hours)      Critically ill patients:  140 - 180 mg/dL   Lab Results  Component Value Date   GLUCAP 186 (H) 10/13/2018   HGBA1C 14.0 (H) 10/06/2018    Review of Glycemic Control Results for SZYMBORSKI, Allison B "MYRENE" (MRN [id number]) as of 10/13/2018 10:17  Ref. Range 10/12/2018 15:55 10/12/2018 16:48 10/12/2018 21:32 10/13/2018 06:22  Glucose-Capillary Latest Ref Range: 70 - 99 mg/dL 225 (H) 200 (H) 200 (H) 186 (H)   Diabetes history: DM 2 Outpatient Diabetes medications: Lantus 20 units daily, Humalog 10 units bid (per patient she varies dose), Sitagliptin-Metformin 50-1000 mg daily Current orders for Inpatient glycemic control: Novolog sensitive tid with meals and HS Levemir 5 units bid Inpatient Diabetes Program Recommendations:   May consider increasing Levemir to 8 units bid.   Thanks,  Adah Perl, RN, BC-ADM Inpatient Diabetes Coordinator Pager [phone] (8a-5p)

## 2018-10-13 NOTE — Progress Notes (Signed)
Physical Therapy Treatment Patient Details Name: Allison Patel MRN: [id number] DOB: 30-Jan-1956 Today's Date: 10/13/2018    History of Present Illness 63 year old woman with DM, HTN, prior gluteal abscess presenting with confusion since Sunday ~3 days. hx DM, HTN p/w severe sepsis secondary to enterobacter pyelnonephritis with hematogenous spread.    PT Comments    Pt was agreeable to getting up and progressing activity.  Emphasis on transitions, sit to stand, progressive gait with the RW and standing exercises.    Follow Up Recommendations  Home health PT;Supervision/Assistance - 24 hour     Equipment Recommendations  Rolling walker with 5" wheels;3in1 (PT)(TBA)    Recommendations for Other Services       Precautions / Restrictions Precautions Precautions: Fall    Mobility  Bed Mobility Overal bed mobility: Needs Assistance Bed Mobility: Supine to Sit     Supine to sit: Supervision     General bed mobility comments: No assist needed  Transfers Overall transfer level: Needs assistance Equipment used: Rolling walker (2 wheeled) Transfers: Sit to/from Stand Sit to Stand: Min guard         General transfer comment: cues for improved safety with hand placement  Ambulation/Gait Ambulation/Gait assistance: Min guard Gait Distance (Feet): 75 Feet Assistive device: Rolling walker (2 wheeled) Gait Pattern/deviations: Step-through pattern;Decreased stride length;Staggering left;Wide base of support     General Gait Details: generally steady, but slow and guarded   Stairs             Wheelchair Mobility    Modified Rankin (Stroke Patients Only)       Balance     Sitting balance-Leahy Scale: Fair       Standing balance-Leahy Scale: Fair(closer to fair) Standing balance comment: pt reliant on B UE support during mobility, preference to UE support as leaning foward on sink to support self                             Cognition  Arousal/Alertness: Awake/alert Behavior During Therapy: WFL for tasks assessed/performed Overall Cognitive Status: (NT formally)                             Awareness: Emergent          Exercises General Exercises - Lower Extremity Hip ABduction/ADduction: AROM;Both;10 reps;Standing Hip Flexion/Marching: AROM;Strengthening;Both;10 reps;Standing Toe Raises: AROM;10 reps;Standing Heel Raises: AROM;10 reps;Standing    General Comments        Pertinent Vitals/Pain Pain Assessment: No/denies pain    Home Living                      Prior Function            PT Goals (current goals can now be found in the care plan section) Acute Rehab PT Goals Patient Stated Goal: to go home PT Goal Formulation: With patient Time For Goal Achievement: 10/21/18 Potential to Achieve Goals: Good Progress towards PT goals: Progressing toward goals    Frequency    Min 3X/week      PT Plan Discharge plan needs to be updated    Co-evaluation              AM-PAC PT "6 Clicks" Mobility   Outcome Measure  Help needed turning from your back to your side while in a flat bed without using bedrails?: None Help needed moving from lying  on your back to sitting on the side of a flat bed without using bedrails?: None Help needed moving to and from a bed to a chair (including a wheelchair)?: A Little Help needed standing up from a chair using your arms (e.g., wheelchair or bedside chair)?: A Little Help needed to walk in hospital room?: A Little Help needed climbing 3-5 steps with a railing? : A Lot 6 Click Score: 19    End of Session   Activity Tolerance: Patient limited by fatigue Patient left: with call bell/phone within reach;in chair;with chair alarm set Nurse Communication: Mobility status PT Visit Diagnosis: Muscle weakness (generalized) (M62.81);Unsteadiness on feet (R26.81)     Time: 2423-5361 PT Time Calculation (min) (ACUTE ONLY): 21 min  Charges:   $Gait Training: 8-22 mins                     10/13/2018  Allison Patel, PT Acute Rehabilitation Services [phone]  (pager) [phone]  (office)   [name] [name] 10/13/2018, 5:23 PM

## 2018-10-13 NOTE — Progress Notes (Addendum)
Allison Patel  XAJ:[id number] DOB: 11-24-1955 DOA: 10/06/2018 PCP: Elby Showers, MD   35 Caucasian fem Prior diabetes-poorly controlled with A1c 14 on multiple prior evaluations Hypertension, hypothyroidism.  Previous admission left buttock abscess 07/07/2017 treated with antibiotics  Admitted by critical care medicine 6/16-UA was positive she had acute kidney injury and lactic acidemia Lactate and mental status did not improve with fluids and antibiotics CCM admitted Treated empirically for pyelo-blood cultures showed Enterobacter urine cultures confirmed gram-negative rods Small non -osbt stones   She has improved symptomatically however her kidney function is still poor De-escalation of antibiotics was entertained and we probably can transition on 6/25 to oral medications for an extended duration probably a total of 21 days ending 10/27/2018  Assessment & Plan:   Principal Problem:   Severe sepsis ([place]) Active Problems:   Benign essential HTN   Hypercholesterolemia without hypertriglyceridemia   Adult hypothyroidism   Diabetes mellitus type 2, uncontrolled ([place])  Severe sepsis? MODS on admit Physiology improved-narrowed to Cefepime-- has  nonobstructive stone ? nidus for infection. continue IV until 6/25 which would be 10 days-usual duration is 5 to 14 days--extend to 21 days STOP DATE after 21 days7/7--see above -Might need stone evaluated Acute pyelonephritis 2/2 enterobacter cloacae Management above AKI, ? ATN 2/2 PTA metformin/Sepsis hypervolemic hyponatremia Very poorly controlled diabetes mellitus raising possibility of underlying diabetic glomerulosclerosis?-A1c was 14 and 2019 and is 14 still Possibility of vasculitis? Non oliguric--appreciate further insights from nephrologist- follow C3, C4, ANA, MPO, protein/creatinine ratio Saline started on 6/23 by nephrology Anemia Acute but mostly stable. Unknown etiology but iron studies? anemia of chronic disease, FOBT  still pending Trend CBC Thrombocytopenia Acute.  Likely component of this being sepsis related See below discussion Diabetes mellitus, type 2-extremely poorly controlled A1c 14 SSI-- Levemir 5 units--->8 u bid twice daily--blood sugars better controlled 186-197 Oral meds held Essential hypertension olmesartan-hydrochlorothiazide, clonidine, amlodipine as an outpatient. Continue amlodipine 10 mg/clonidine 0.2 twice daily Hydralazine prn  Acute respiratory failure with hypoxia Resolved and currently on room air Anxiety Patient has not taken Xanax since her brother died She was sleepy on 11-07-22 and I discontinued meds and she is now back to her normal state Hyperlipidemia Transaminitis?  NASH Patient tells me she has had in the past cholecystectomy-as her LFTs are improving would probably get repeat labs as an outpatient she does have a habitus that could also be suspicious for Karlene Lineman--- recheck LFTs-May need outpatient ultrasound liver Murmur Per patient, this is not new.Outpatient follow-up/management with [facility] Morbid obesity Body mass index is 35.16 kg/m.   DVT prophylaxis: SCDs Code Status:   Code Status: Full Code Family Communication: 10 min discussion with daughter--all q answered Disposition Plan: Likely CIR   Consultants:   PCCM  Nephrology  Procedures:   None  Antimicrobials:  Vancomycin (6/16)  Flagyl (6/16)  Cefepime (6/16>>    Subjective:  More awake alert tells me he has not taken Xanax in a while No chest pain no fever No nausea no vomiting No shortness of breath no stool today  Objective: Vitals:   November 07, 2018 2344 10/13/18 0412 10/13/18 0500 10/13/18 1053  BP: (!) 123/53 (!) 133/55  (!) 126/56  Pulse: 89 93  97  Resp: (!) 29 (!) 31  (!) 27  Temp: 99 F (37.2 C) 99.1 F (37.3 C)  99.3 F (37.4 C)  TempSrc: Oral Oral  Oral  SpO2: 92% 92%  98%  Weight:   80.3 kg   Height:  Intake/Output Summary (Last 24 hours) at 10/13/2018 1110  Last data filed at 10/13/2018 0643 Gross per 24 hour  Intake 890 ml  Output 1250 ml  Net -360 ml   Filed Weights   10/11/18 0500 10/12/18 0500 10/13/18 0500  Weight: 80 kg 80.6 kg 80.3 kg    Examination:  Awake pleasant,?  Cushingoid plethoric appearance  much clearer mentally External ocular movements intact no focal neurological deficit Chest clinically clear no added sound Abdomen soft nontender no rebound no guarding Lower extremities are soft with no rash or swelling S1-S2 no murmur rub or gallop   Data Reviewed: I have personally reviewed following labs and imaging studies  CBC: Recent Labs  Lab 10/09/18 0301 10/10/18 0306 10/11/18 0226 10/12/18 0231 10/13/18 0300  WBC 7.2 8.3 9.1 11.7* 12.4*  HGB 10.1* 9.2* 8.9* 9.0* 8.7*  HCT 29.1* 26.3* 26.3* 26.5* 25.8*  MCV 79.9* 79.7* 81.7 81.8 81.9  PLT 43* 58* 95* 168 967   Basic Metabolic Panel: Recent Labs  Lab 10/07/18 0719  10/09/18 0301 10/10/18 0306 10/11/18 0226 10/12/18 0231 10/13/18 0300  NA 132*   < > 131* 130* 129* 128* 128*  K 3.5   < > 3.2* 3.2* 4.1 3.8 4.3  CL 98   < > 97* 97* 99 98 99  CO2 22   < > 22 23 20* 20* 20*  GLUCOSE 180*   < > 98 83 166* 244* 197*  BUN 53*   < > 57* 60* 68* 73* 77*  CREATININE 3.23*   < > 3.34* 3.22* 3.27* 3.55* 4.04*  CALCIUM 6.6*   < > 7.3* 7.3* 7.3* 7.4* 7.5*  MG 1.6*  --   --   --   --   --   --   PHOS 2.1*   < > 3.0 2.1* 1.8* 2.4* 3.4   < > = values in this interval not displayed.   GFR: Estimated Creatinine Clearance: 13.4 mL/min (A) (by C-G formula based on SCr of 4.04 mg/dL (H)). Liver Function Tests: Recent Labs  Lab 10/07/18 0719 10/08/18 0237  10/09/18 0301 10/10/18 0306 10/11/18 0226 10/12/18 0231 10/13/18 0300  AST 83* 85*  --  79* 46*  --  21  --   ALT 67* 61*  --  61* 51*  --  29  --   ALKPHOS 243* 248*  --  268* 270*  --  239*  --   BILITOT 3.2* 3.5*  --  3.9* 2.9*  --  1.7*  --   PROT 5.0* 4.9*  --  5.2* 5.1*  --  5.0*  --   ALBUMIN 2.0*  1.8*   < > 1.6*  1.6* 1.5*  1.4* 1.4* 1.4*  1.4* 1.3*   < > = values in this interval not displayed.   No results for input(s): LIPASE, AMYLASE in the last 168 hours. No results for input(s): AMMONIA in the last 168 hours. Coagulation Profile: No results for input(s): INR, PROTIME in the last 168 hours. Cardiac Enzymes: No results for input(s): CKTOTAL, CKMB, CKMBINDEX, TROPONINI in the last 168 hours. BNP (last 3 results) No results for input(s): PROBNP in the last 8760 hours. HbA1C: No results for input(s): HGBA1C in the last 72 hours. CBG: Recent Labs  Lab 10/12/18 1119 10/12/18 1555 10/12/18 1648 10/12/18 2132 10/13/18 0622  GLUCAP 252* 225* 200* 200* 186*   Lipid Profile: No results for input(s): CHOL, HDL, LDLCALC, TRIG, CHOLHDL, LDLDIRECT in the last 72 hours. Thyroid Function Tests:  No results for input(s): TSH, T4TOTAL, FREET4, T3FREE, THYROIDAB in the last 72 hours. Anemia Panel: No results for input(s): VITAMINB12, FOLATE, FERRITIN, TIBC, IRON, RETICCTPCT in the last 72 hours. Sepsis Labs: No results for input(s): PROCALCITON, LATICACIDVEN in the last 168 hours.  Radiology Studies: No results found. Scheduled Meds: . amLODipine  10 mg Oral Daily  . cloNIDine  0.2 mg Oral BID  . insulin aspart  0-9 Units Subcutaneous TID WC  . insulin detemir  5 Units Subcutaneous BID  . polyethylene glycol  17 g Oral Daily   Continuous Infusions: . sodium chloride       LOS: 7 days     Verneita Griffes, MD [facility] 11:10 AM  If 7PM-7AM, please contact night-coverage [email or web address]

## 2018-10-13 NOTE — Progress Notes (Addendum)
[facility]    NEPHROLOGY PROGRESS NOTE  SUBJECTIVE: Pt had 1.6 liters UOP over 6.22.  No longer on antibiotics.  Completed cefepime on 6/22.   Review of systems:  No shortness of breath or chest pain No nausea or vomiting    OBJECTIVE:  Vitals:   10/12/18 2344 10/13/18 0412  BP: (!) 123/53 (!) 133/55  Pulse: 89 93  Resp: (!) 29 (!) 31  Temp: 99 F (37.2 C) 99.1 F (37.3 C)  SpO2: 92% 92%    Intake/Output Summary (Last 24 hours) at 10/13/2018 0935 Last data filed at 10/13/2018 1275 Gross per 24 hour  Intake 890 ml  Output 1250 ml  Net -360 ml      General:  AAOx3 NAD HEENT: MMM [place] AT anicteric sclera Neck:  No JVD CV:  Heart RRR  Lungs clear anteriorly  Abd:  abd SNT/ND with normal BS GU:  Bladder non-palpable Extremities: trace edema lower extremities  Skin:  No skin rash  MEDICATIONS:  . amLODipine  10 mg Oral Daily  . cloNIDine  0.2 mg Oral BID  . insulin aspart  0-9 Units Subcutaneous TID WC  . insulin detemir  5 Units Subcutaneous BID  . polyethylene glycol  17 g Oral Daily       LABS:   CBC Latest Ref Rng & Units 10/13/2018 10/12/2018 10/11/2018  WBC 4.0 - 10.5 K/uL 12.4(H) 11.7(H) 9.1  Hemoglobin 12.0 - 15.0 g/dL 8.7(L) 9.0(L) 8.9(L)  Hematocrit 36.0 - 46.0 % 25.8(L) 26.5(L) 26.3(L)  Platelets 150 - 400 K/uL 211 168 95(L)    CMP Latest Ref Rng & Units 10/13/2018 10/12/2018 10/11/2018  Glucose 70 - 99 mg/dL 197(H) 244(H) 166(H)  BUN 8 - 23 mg/dL 77(H) 73(H) 68(H)  Creatinine 0.44 - 1.00 mg/dL 4.04(H) 3.55(H) 3.27(H)  Sodium 135 - 145 mmol/L 128(L) 128(L) 129(L)  Potassium 3.5 - 5.1 mmol/L 4.3 3.8 4.1  Chloride 98 - 111 mmol/L 99 98 99  CO2 22 - 32 mmol/L 20(L) 20(L) 20(L)  Calcium 8.9 - 10.3 mg/dL 7.5(L) 7.4(L) 7.3(L)  Total Protein 6.5 - 8.1 g/dL - 5.0(L) -  Total Bilirubin 0.3 - 1.2 mg/dL - 1.7(H) -  Alkaline Phos 38 - 126 U/L - 239(H) -  AST 15 - 41 U/L - 21 -  ALT 0 - 44 U/L - 29 -    Lab Results  Component Value Date   CALCIUM 7.5 (L) 10/13/2018   CAION 0.94 (L) 10/06/2018   PHOS 3.4 10/13/2018       Component Value Date/Time   COLORURINE YELLOW 10/12/2018 1115   APPEARANCEUR CLOUDY (A) 10/12/2018 1115   LABSPEC 1.010 10/12/2018 1115   PHURINE 5.0 10/12/2018 1115   GLUCOSEU 150 (A) 10/12/2018 1115   HGBUR MODERATE (A) 10/12/2018 1115   BILIRUBINUR NEGATIVE 10/12/2018 1115   BILIRUBINUR NEG 07/03/2017 1527   KETONESUR NEGATIVE 10/12/2018 1115   PROTEINUR 100 (A) 10/12/2018 1115   UROBILINOGEN 0.2 07/03/2017 1527   NITRITE NEGATIVE 10/12/2018 1115   LEUKOCYTESUR LARGE (A) 10/12/2018 1115      Component Value Date/Time   HCO3 17.4 (L) 10/06/2018 1115   TCO2 18 (L) 10/06/2018 1115   ACIDBASEDEF 8.0 (H) 10/06/2018 1115   O2SAT 65.0 10/06/2018 1115       Component Value Date/Time   IRON 7 (L) 10/07/2018 1426   TIBC 167 (L) 10/07/2018 1426   FERRITIN 740 (H) 10/08/2018 0957   IRONPCTSAT 4 (L) 10/07/2018 1426       ASSESSMENT/PLAN:  63 year old female patient with past medical history of diabetes, hypertension, and hypothyroidism presented for sepsis secondary to pyelonephritis.   1.  Acute kidney injury.  Previously felt 2/2 sepsis and pyelo or possible AIN.  Note baseline serum creatinine 0.85 from 03/16/2018.  Urinalysis consistent with infection.  Renal ultrasound revealed no evidence of obstruction.  There is a stone in the mid to lower left kidney.  Continues with good urine output but creatinine continues to rise.   - Send serologic work-up ANA, ANCA, complement - Normal saline at 75 ml/hr - Check post-void residual bladder scan   2. Proteinuria  - Send urine protein/cr ratio to quantify   3. Hematuria - Setting of infection as well as stone and improving on repeat.  Note hematuria on 06/2017 study as well  - With rising Cr will send ANA, ANCA complement   4.  Pyelonephritis.  Currently off of antibiotics.  Leuks and WBC noted on UA.  Discussed with primary team   5.  Diabetes  mellitus.  Continue insulin.  Needs tight diabetes control.  Blood sugars have significantly improved during this hospitalization.  Hold metformin  6.  Anemia - microcytic.  Stable.  No acute indication for PRBC's  7.  Thrombocytopenia.  Resolved.  Felt likely related to sepsis.  No evidence of thrombotic microangiopathy.  8.  Hypertension.  Increased amlodipine to 10mg  daily.

## 2018-10-14 ENCOUNTER — Inpatient Hospital Stay (HOSPITAL_COMMUNITY): Payer: No Typology Code available for payment source

## 2018-10-14 DIAGNOSIS — R6521 Severe sepsis with septic shock: Secondary | ICD-10-CM

## 2018-10-14 DIAGNOSIS — N179 Acute kidney failure, unspecified: Secondary | ICD-10-CM

## 2018-10-14 DIAGNOSIS — D696 Thrombocytopenia, unspecified: Secondary | ICD-10-CM

## 2018-10-14 DIAGNOSIS — E118 Type 2 diabetes mellitus with unspecified complications: Secondary | ICD-10-CM

## 2018-10-14 DIAGNOSIS — E1165 Type 2 diabetes mellitus with hyperglycemia: Secondary | ICD-10-CM

## 2018-10-14 DIAGNOSIS — R7881 Bacteremia: Secondary | ICD-10-CM

## 2018-10-14 DIAGNOSIS — N10 Acute tubulo-interstitial nephritis: Secondary | ICD-10-CM

## 2018-10-14 DIAGNOSIS — J9601 Acute respiratory failure with hypoxia: Secondary | ICD-10-CM

## 2018-10-14 LAB — CBC WITH DIFFERENTIAL/PLATELET
Abs Immature Granulocytes: 0.7 10*3/uL — ABNORMAL HIGH (ref 0.00–0.07)
Basophils Absolute: 0.1 10*3/uL (ref 0.0–0.1)
Basophils Relative: 1 %
Eosinophils Absolute: 0.2 10*3/uL (ref 0.0–0.5)
Eosinophils Relative: 1 %
HCT: 25.2 % — ABNORMAL LOW (ref 36.0–46.0)
Hemoglobin: 8.4 g/dL — ABNORMAL LOW (ref 12.0–15.0)
Immature Granulocytes: 5 %
Lymphocytes Relative: 6 %
Lymphs Abs: 0.8 10*3/uL (ref 0.7–4.0)
MCH: 27.8 pg (ref 26.0–34.0)
MCHC: 33.3 g/dL (ref 30.0–36.0)
MCV: 83.4 fL (ref 80.0–100.0)
Monocytes Absolute: 1.1 10*3/uL — ABNORMAL HIGH (ref 0.1–1.0)
Monocytes Relative: 9 %
Neutro Abs: 10.2 10*3/uL — ABNORMAL HIGH (ref 1.7–7.7)
Neutrophils Relative %: 78 %
Platelets: 245 10*3/uL (ref 150–400)
RBC: 3.02 MIL/uL — ABNORMAL LOW (ref 3.87–5.11)
RDW: 15.1 % (ref 11.5–15.5)
WBC: 13 10*3/uL — ABNORMAL HIGH (ref 4.0–10.5)
nRBC: 0 % (ref 0.0–0.2)

## 2018-10-14 LAB — ECHOCARDIOGRAM COMPLETE
Height: 59.5 in
Weight: 2843.05 [oz_av]

## 2018-10-14 LAB — HEPATIC FUNCTION PANEL
ALT: 20 U/L (ref 0–44)
AST: 17 U/L (ref 15–41)
Albumin: 1.4 g/dL — ABNORMAL LOW (ref 3.5–5.0)
Alkaline Phosphatase: 233 U/L — ABNORMAL HIGH (ref 38–126)
Bilirubin, Direct: 0.3 mg/dL — ABNORMAL HIGH (ref 0.0–0.2)
Indirect Bilirubin: 0.9 mg/dL (ref 0.3–0.9)
Total Bilirubin: 1.2 mg/dL (ref 0.3–1.2)
Total Protein: 5.1 g/dL — ABNORMAL LOW (ref 6.5–8.1)

## 2018-10-14 LAB — RENAL FUNCTION PANEL
Albumin: 1.4 g/dL — ABNORMAL LOW (ref 3.5–5.0)
Anion gap: 11 (ref 5–15)
BUN: 78 mg/dL — ABNORMAL HIGH (ref 8–23)
CO2: 17 mmol/L — ABNORMAL LOW (ref 22–32)
Calcium: 7.3 mg/dL — ABNORMAL LOW (ref 8.9–10.3)
Chloride: 102 mmol/L (ref 98–111)
Creatinine, Ser: 4.12 mg/dL — ABNORMAL HIGH (ref 0.44–1.00)
GFR calc Af Amer: 13 mL/min — ABNORMAL LOW (ref >60)
GFR calc non Af Amer: 11 mL/min — ABNORMAL LOW (ref >60)
Glucose, Bld: 182 mg/dL — ABNORMAL HIGH (ref 70–99)
Phosphorus: 4.1 mg/dL (ref 2.5–4.6)
Potassium: 4.2 mmol/L (ref 3.5–5.1)
Sodium: 130 mmol/L — ABNORMAL LOW (ref 135–145)

## 2018-10-14 LAB — GLUCOSE, CAPILLARY
Glucose-Capillary: 217 mg/dL — ABNORMAL HIGH (ref 70–99)
Glucose-Capillary: 168 mg/dL — ABNORMAL HIGH (ref 70–99)
Glucose-Capillary: 204 mg/dL — ABNORMAL HIGH (ref 70–99)
Glucose-Capillary: 179 mg/dL — ABNORMAL HIGH (ref 70–99)
Glucose-Capillary: 157 mg/dL — ABNORMAL HIGH (ref 70–99)

## 2018-10-14 LAB — MPO/PR-3 (ANCA) ANTIBODIES
ANCA Proteinase 3: <3.5 U/mL (ref 0.0–3.5)
Myeloperoxidase Abs: <9 U/mL (ref 0.0–9.0)

## 2018-10-14 LAB — ANA: Anti Nuclear Antibody (ANA): NEGATIVE

## 2018-10-14 LAB — C4 COMPLEMENT: Complement C4, Body Fluid: 28 mg/dL (ref 14–44)

## 2018-10-14 LAB — C3 COMPLEMENT: C3 Complement: 118 mg/dL (ref 82–167)

## 2018-10-14 MED ORDER — METOPROLOL TARTRATE 12.5 MG HALF TABLET
12.5000 mg | ORAL_TABLET | Freq: Two times a day (BID) | ORAL | Status: DC
  Administered 2018-10-14 – 2018-10-23 (×19): 12.5 mg via ORAL
  Filled 2018-10-14 (×19): qty 1

## 2018-10-14 MED ORDER — PERFLUTREN LIPID MICROSPHERE
INTRAVENOUS | Status: AC
  Filled 2018-10-14: qty 10

## 2018-10-14 MED ORDER — INSULIN ASPART 100 UNIT/ML ~~LOC~~ SOLN
5.0000 [IU] | Freq: Three times a day (TID) | SUBCUTANEOUS | Status: DC
  Administered 2018-10-14 – 2018-10-18 (×10): 5 [IU] via SUBCUTANEOUS

## 2018-10-14 MED ORDER — CIPROFLOXACIN HCL 500 MG PO TABS
500.0000 mg | ORAL_TABLET | Freq: Every day | ORAL | Status: AC
  Administered 2018-10-15 – 2018-10-19 (×5): 500 mg via ORAL
  Filled 2018-10-14 (×5): qty 1

## 2018-10-14 MED ORDER — INSULIN DETEMIR 100 UNIT/ML ~~LOC~~ SOLN
10.0000 [IU] | Freq: Two times a day (BID) | SUBCUTANEOUS | Status: DC
  Administered 2018-10-14 – 2018-10-19 (×10): 10 [IU] via SUBCUTANEOUS
  Filled 2018-10-14 (×11): qty 0.1

## 2018-10-14 MED ORDER — PERFLUTREN LIPID MICROSPHERE
1.0000 mL | INTRAVENOUS | Status: AC | PRN
  Administered 2018-10-14: 2 mL via INTRAVENOUS
  Filled 2018-10-14: qty 10

## 2018-10-14 NOTE — Progress Notes (Signed)
[facility]    NEPHROLOGY PROGRESS NOTE  SUBJECTIVE: Pt had 700 mL uop charted over 6/23.  Off of IV abx.  See bladder scan 400 and 248 mL and 325 mL reported and pt with no reported urge.  She would want dialysis if indicated - we discussed that there is no acute need but that one could develop if renal function continues to worsen.    Review of systems:  No shortness of breath or chest pain Denies n/v   OBJECTIVE:  Vitals:   10/14/18 0300 10/14/18 0717  BP: 130/65 (!) 158/65  Pulse: 96 99  Resp: (!) 34 (!) 22  Temp: 98.8 F (37.1 C) 98.6 F (37 C)  SpO2: 97% 98%    Intake/Output Summary (Last 24 hours) at 10/14/2018 7034 Last data filed at 10/14/2018 0700 Gross per 24 hour  Intake 1437.28 ml  Output 700 ml  Net 737.28 ml      General:  AAOx3 NAD HEENT: MMM [place] AT anicteric sclera Neck:  No JVD CV:  Heart RRR  Lungs clear and unlabored  Abd:  abd SNT/ND with normal BS Extremities: no edema lower extremities  Skin:  No skin rash  MEDICATIONS:  . amLODipine  10 mg Oral Daily  . cloNIDine  0.2 mg Oral BID  . insulin aspart  0-9 Units Subcutaneous TID WC  . insulin detemir  8 Units Subcutaneous BID  . polyethylene glycol  17 g Oral Daily       LABS:   CBC Latest Ref Rng & Units 10/14/2018 10/13/2018 10/12/2018  WBC 4.0 - 10.5 K/uL 13.0(H) 12.4(H) 11.7(H)  Hemoglobin 12.0 - 15.0 g/dL 8.4(L) 8.7(L) 9.0(L)  Hematocrit 36.0 - 46.0 % 25.2(L) 25.8(L) 26.5(L)  Platelets 150 - 400 K/uL 245 211 168    CMP Latest Ref Rng & Units 10/14/2018 10/13/2018 10/12/2018  Glucose 70 - 99 mg/dL 182(H) 197(H) 244(H)  BUN 8 - 23 mg/dL 78(H) 77(H) 73(H)  Creatinine 0.44 - 1.00 mg/dL 4.12(H) 4.04(H) 3.55(H)  Sodium 135 - 145 mmol/L 130(L) 128(L) 128(L)  Potassium 3.5 - 5.1 mmol/L 4.2 4.3 3.8  Chloride 98 - 111 mmol/L 102 99 98  CO2 22 - 32 mmol/L 17(L) 20(L) 20(L)  Calcium 8.9 - 10.3 mg/dL 7.3(L) 7.5(L) 7.4(L)  Total Protein 6.5 - 8.1 g/dL 5.1(L) - 5.0(L)  Total  Bilirubin 0.3 - 1.2 mg/dL 1.2 - 1.7(H)  Alkaline Phos 38 - 126 U/L 233(H) - 239(H)  AST 15 - 41 U/L 17 - 21  ALT 0 - 44 U/L 20 - 29    Lab Results  Component Value Date   CALCIUM 7.3 (L) 10/14/2018   CAION 0.94 (L) 10/06/2018   PHOS 4.1 10/14/2018       Component Value Date/Time   COLORURINE YELLOW 10/12/2018 1115   APPEARANCEUR CLOUDY (A) 10/12/2018 1115   LABSPEC 1.010 10/12/2018 1115   PHURINE 5.0 10/12/2018 1115   GLUCOSEU 150 (A) 10/12/2018 1115   HGBUR MODERATE (A) 10/12/2018 1115   BILIRUBINUR NEGATIVE 10/12/2018 1115   BILIRUBINUR NEG 07/03/2017 1527   KETONESUR NEGATIVE 10/12/2018 1115   PROTEINUR 100 (A) 10/12/2018 1115   UROBILINOGEN 0.2 07/03/2017 1527   NITRITE NEGATIVE 10/12/2018 1115   LEUKOCYTESUR LARGE (A) 10/12/2018 1115      Component Value Date/Time   HCO3 17.4 (L) 10/06/2018 1115   TCO2 18 (L) 10/06/2018 1115   ACIDBASEDEF 8.0 (H) 10/06/2018 1115   O2SAT 65.0 10/06/2018 1115       Component Value Date/Time  IRON 7 (L) 10/07/2018 1426   TIBC 167 (L) 10/07/2018 1426   FERRITIN 740 (H) 10/08/2018 0957   IRONPCTSAT 4 (L) 10/07/2018 1426       ASSESSMENT/PLAN:    63 year old female patient with past medical history of diabetes, hypertension, and hypothyroidism presented for sepsis secondary to pyelonephritis.     1.  Acute kidney injury.  Previously felt 2/2 sepsis and pyelo or possible AIN.  Note baseline serum creatinine 0.85 from 03/16/2018.  Urinalysis consistent with infection.  Renal ultrasound revealed no evidence of obstruction.  There is a stone in the mid to lower left kidney.  Limited reserve with uncontrolled diabetes - ANA, ANCA pending   - Normal saline at 75 ml/hr - Noted elevated bladder scan volumes - will place foley with creatinine rising  - Check urine eos.  Minimal serum eos  - No acute indication for dialysis; monitoring for needs - Would consider transitioning to an alternate antibiotic such as cipro   2. Proteinuria  -  3240 mg/g up/cr ratio.  Feel secondary to diabetes with hemoglobin A1c over 14 - Send spep and free light chains   3. Hematuria - Setting of infection as well as stone and improving on repeat.  Note hematuria on 06/2017 study as well.  Complement normal   - ANA, ANCA are pending   4.  Pyelonephritis.  On cefepime per primary team.  Would consider transitioning to an alternate antibiotic such as cipro.  Leuks and WBC noted on UA.  I have contacted primary team to discuss further  5. Bacteremia - enterobacter.  Repeat blood cultures. I have contacted primary team to discuss further.   6.  Diabetes mellitus.  Per primary team   7..  Anemia - microcytic.  Stable.  No acute indication for PRBC's  8.  Thrombocytopenia.  Resolved.  Felt likely related to sepsis.  No evidence of thrombotic microangiopathy.  9.  Hypertension.  Continue current regimen for now.  Avoid hypotension.    Claudia Desanctis 10/14/2018 8:07 AM

## 2018-10-14 NOTE — Progress Notes (Signed)
Inpatient Rehab Admissions Coordinator:   Spoke with husband on the phone re: pt's current progress, and rehab needs.  Jenny Reichmann does confirm that he can take up to 2 weeks time to provide 24/7 supervision to patient at discharge from acute setting or CIR setting, if needed.  We discussed her current level of function, ongoing medical work up and need to continue to follow for possible rehab needs prior to discharge.  John asked about being able to visit pt to determine whether he felt like he could take care of her at her current level of function before deciding whether or not CIR would be a good option.  Will f/u with nursing/CM on this and continue to follow pt for possible rehab needs.   Shann Medal, PT, DPT Admissions Coordinator [phone] 10/14/18  10:12 AM

## 2018-10-14 NOTE — Progress Notes (Signed)
PROGRESS NOTE    Allison Patel  EGH:[id number] DOB: 1955/06/06 DOA: 10/06/2018 PCP: Elby Showers, MD   Brief Narrative:  63 year old WF PMHX DM type II uncontrolled with complication,, HTN, murmur, prior gluteal abscess, HLD, hypothyroidism, morbid obesity,  Presenting with confusion since 'Sunday ~3 days. Initially got better with PO fluids but got worse last night into day of admission. Admission labs notable for +UA, actidosis, acute kidney injury, and lactic acidemia. Lactate and mental status did not improve with fluids and abx in ER so intensivist asked to admit.     Subjective: A/O x4, negative S OB, negative abdominal pain negative CP.  States met with diabetic coordinator and diabetic nutritionist.   Assessment & Plan:   Principal Problem:   Severe sepsis (HCC) Active Problems:   Benign essential HTN   Hypercholesterolemia without hypertriglyceridemia   Adult hypothyroidism   Diabetes mellitus type 2, uncontrolled (HCC)  Severe sepsis/MODS   Bacteremia positive ENTEROBACTER CLOACAE  Acute respiratory failure with hypoxia -Resolved, now on room air  Essential HTN -Amlodipine 10 mg daily -Clonidine 0.2 mg twice daily - Hydralazine PRN -6/24 metoprolol 12.5 mg twice daily  Cardiac murmur - Although not new considering patient has bacteremia will obtain echocardiogram   Acute pyelonephritis positive ENTEROBACTER CLOACAE  AKI/proteinuria/hematuria    Diabetes type 2 uncontrolled with complication - 6/16 hemoglobin A1c= 14 -Lipid panel pending - 6/24 increase Levemir 10 units twice daily - 6/24 start NovoLog 5 units q. AC - Sensitive SSI         Severe sepsis? MODS on admit Physiology improved-narrowed to Cefepime-- has  nonobstructive stone ? nidus for infection. continue IV until 6/25 which would be 10 days-usual duration is 5 to 14 days--extend to 21 days STOP DATE after 21 days7/7--see above -Might need stone evaluated AKI, ? ATN 2/2 PTA  metformin/Sepsis hypervolemic hyponatremia Very poorly controlled diabetes mellitus raising possibility of underlying diabetic glomerulosclerosis?-A1c was 14 and 2019 and is 14 still Possibility of vasculitis? Non oliguric--appreciate further insights from nephrologist- follow C3, C4, ANA, MPO, protein/creatinine ratio Saline started on 6/23 by nephrology Anemia Acute but mostly stable. Unknown etiology but iron studies? anemia of chronic disease, FOBT still pending Trend CBC Thrombocytopenia Acute.  Likely component of this being sepsis related See below discussion  Anxiety Patient has not taken Xanax since her brother died She was sleepy on 6/22 and I discontinued meds and she is now back to her normal state Hyperlipidemia Transaminitis?  NASH Patient tells me she has had in the past cholecystectomy-as her LFTs are improving would probably get repeat labs as an outpatient she does have a habitus that could also be suspicious for [place]--- recheck LFTs-May need outpatient ultrasound liver  Morbid obesity Body mass index is 35.16 kg/m.    DVT prophylaxis: SCD Code Status: Full Family Communication: None Disposition Plan: TBD   Consultants:  Nephrology PCCM  Procedures/Significant Events:  None   I have personally reviewed and interpreted all radiology studies and my findings are as above.  VENTILATOR SETTINGS:    Cultures 6/16 SARS coronavirus negative 6/16 blood left antecubital positive ENTEROBACTER CLOACAE 6/16 blood left wrist positive ENTEROBACTER CLOACAE 6/16 urine positive ENTEROBACTER CLOACAE 6/17 HIV negative 6/24 blood pending   Antimicrobials: Anti-infectives (From admission, onward)   Start     Stop   10/13/18 1230  ceFEPIme (MAXIPIME) 1 g in sodium chloride 0.9 % 100 mL IVPB         06' /19/20 0930  ceFEPIme (MAXIPIME)  2 g in sodium chloride 0.9 % 100 mL IVPB  Status:  Discontinued     10/12/18 1106   10/09/18 0745  cefTRIAXone (ROCEPHIN) 2 g in  sodium chloride 0.9 % 100 mL IVPB  Status:  Discontinued     10/09/18 0744   10/07/18 1000  ceFEPIme (MAXIPIME) 2 g in sodium chloride 0.9 % 100 mL IVPB  Status:  Discontinued     10/06/18 1330   10/07/18 0900  ceFEPIme (MAXIPIME) 2 g in sodium chloride 0.9 % 100 mL IVPB  Status:  Discontinued     10/09/18 0739   10/07/18 0800  cefTRIAXone (ROCEPHIN) 2 g in sodium chloride 0.9 % 100 mL IVPB  Status:  Discontinued     10/07/18 0326   10/06/18 1345  cefTRIAXone (ROCEPHIN) 2 g in sodium chloride 0.9 % 100 mL IVPB  Status:  Discontinued     10/06/18 1509   10/06/18 1051  vancomycin variable dose per unstable renal function (pharmacist dosing)  Status:  Discontinued     10/06/18 1334   10/06/18 0900  ceFEPIme (MAXIPIME) 2 g in sodium chloride 0.9 % 100 mL IVPB     10/06/18 0954   10/06/18 0900  metroNIDAZOLE (FLAGYL) IVPB 500 mg     10/06/18 1028   10/06/18 0900  vancomycin (VANCOCIN) IVPB 1000 mg/200 mL premix  Status:  Discontinued     10/06/18 0849   10/06/18 0900  vancomycin (VANCOCIN) 1,500 mg in sodium chloride 0.9 % 500 mL IVPB     10/06/18 1233       Devices    LINES / TUBES:      Continuous Infusions: . sodium chloride 75 mL/hr at 10/14/18 0700  . ceFEPime (MAXIPIME) IV 1 g (10/13/18 1303)     Objective: Vitals:   10/13/18 2300 10/14/18 0300 10/14/18 0500 10/14/18 0717  BP: 139/67 130/65  (!) 158/65  Pulse: 97 96  99  Resp: (!) 31 (!) 34  (!) 22  Temp: 98 F (36.7 C) 98.8 F (37.1 C)  98.6 F (37 C)  TempSrc: Oral Oral  Oral  SpO2: 90% 97%  98%  Weight:   80.6 kg   Height:        Intake/Output Summary (Last 24 hours) at 10/14/2018 0748 Last data filed at 10/14/2018 0700 Gross per 24 hour  Intake 1437.28 ml  Output 700 ml  Net 737.28 ml   Filed Weights   10/12/18 0500 10/13/18 0500 10/14/18 0500  Weight: 80.6 kg 80.3 kg 80.6 kg    Examination:  General: A/O x4, no acute respiratory distress Eyes: negative scleral hemorrhage, negative anisocoria,  negative icterus ENT: Negative Runny nose, negative gingival bleeding, Neck:  Negative scars, masses, torticollis, lymphadenopathy, JVD Lungs: Clear to auscultation bilaterally without wheezes or crackles Cardiovascular: Regular rate and rhythm without murmur gallop or rub normal S1 and S2 Abdomen: Morbidly obese, negative abdominal pain, nondistended, positive soft, bowel sounds, no rebound, no ascites, no appreciable mass Extremities: No significant cyanosis, clubbing, or edema bilateral lower extremities Skin: Negative rashes, lesions, ulcers Psychiatric:  Negative depression, negative anxiety, negative fatigue, negative mania  Central nervous system:  Cranial nerves II through XII intact, tongue/uvula midline, all extremities muscle strength 5/5, sensation intact throughout,  negative dysarthria, negative expressive aphasia, negative receptive aphasia.  .     Data Reviewed: Care during the described time interval was provided by me .  I have reviewed this patient's available data, including medical history, events of note, physical examination, and  all test results as part of my evaluation.   CBC: Recent Labs  Lab 10/10/18 0306 10/11/18 0226 10/12/18 0231 10/13/18 0300 10/14/18 0212  WBC 8.3 9.1 11.7* 12.4* 13.0*  NEUTROABS  --   --   --   --  10.2*  HGB 9.2* 8.9* 9.0* 8.7* 8.4*  HCT 26.3* 26.3* 26.5* 25.8* 25.2*  MCV 79.7* 81.7 81.8 81.9 83.4  PLT 58* 95* 168 211 374   Basic Metabolic Panel: Recent Labs  Lab 10/10/18 0306 10/11/18 0226 10/12/18 0231 10/13/18 0300 10/14/18 0212  NA 130* 129* 128* 128* 130*  K 3.2* 4.1 3.8 4.3 4.2  CL 97* 99 98 99 102  CO2 23 20* 20* 20* 17*  GLUCOSE 83 166* 244* 197* 182*  BUN 60* 68* 73* 77* 78*  CREATININE 3.22* 3.27* 3.55* 4.04* 4.12*  CALCIUM 7.3* 7.3* 7.4* 7.5* 7.3*  PHOS 2.1* 1.8* 2.4* 3.4 4.1   GFR: Estimated Creatinine Clearance: 13.2 mL/min (A) (by C-G formula based on SCr of 4.12 mg/dL (H)). Liver Function Tests:  Recent Labs  Lab 10/08/18 0237  10/09/18 0301 10/10/18 0306 10/11/18 0226 10/12/18 0231 10/13/18 0300 10/14/18 0212  AST 85*  --  79* 46*  --  21  --  17  ALT 61*  --  61* 51*  --  29  --  20  ALKPHOS 248*  --  268* 270*  --  239*  --  233*  BILITOT 3.5*  --  3.9* 2.9*  --  1.7*  --  1.2  PROT 4.9*  --  5.2* 5.1*  --  5.0*  --  5.1*  ALBUMIN 1.8*   < > 1.6*  1.6* 1.5*  1.4* 1.4* 1.4*  1.4* 1.3* 1.4*  1.4*   < > = values in this interval not displayed.   No results for input(s): LIPASE, AMYLASE in the last 168 hours. No results for input(s): AMMONIA in the last 168 hours. Coagulation Profile: No results for input(s): INR, PROTIME in the last 168 hours. Cardiac Enzymes: No results for input(s): CKTOTAL, CKMB, CKMBINDEX, TROPONINI in the last 168 hours. BNP (last 3 results) No results for input(s): PROBNP in the last 8760 hours. HbA1C: No results for input(s): HGBA1C in the last 72 hours. CBG: Recent Labs  Lab 10/13/18 0622 10/13/18 1051 10/13/18 1540 10/13/18 2113 10/14/18 0639  GLUCAP 186* 183* 187* 217* 168*   Lipid Profile: No results for input(s): CHOL, HDL, LDLCALC, TRIG, CHOLHDL, LDLDIRECT in the last 72 hours. Thyroid Function Tests: No results for input(s): TSH, T4TOTAL, FREET4, T3FREE, THYROIDAB in the last 72 hours. Anemia Panel: No results for input(s): VITAMINB12, FOLATE, FERRITIN, TIBC, IRON, RETICCTPCT in the last 72 hours. Urine analysis:    Component Value Date/Time   COLORURINE YELLOW 10/12/2018 1115   APPEARANCEUR CLOUDY (A) 10/12/2018 1115   LABSPEC 1.010 10/12/2018 1115   PHURINE 5.0 10/12/2018 1115   GLUCOSEU 150 (A) 10/12/2018 1115   HGBUR MODERATE (A) 10/12/2018 1115   BILIRUBINUR NEGATIVE 10/12/2018 1115   BILIRUBINUR NEG 07/03/2017 1527   KETONESUR NEGATIVE 10/12/2018 1115   PROTEINUR 100 (A) 10/12/2018 1115   UROBILINOGEN 0.2 07/03/2017 1527   NITRITE NEGATIVE 10/12/2018 1115   LEUKOCYTESUR LARGE (A) 10/12/2018 1115   Sepsis  Labs: '@[email or web address]' (procalcitonin:4,lacticidven:4)  ) Recent Results (from the past 240 hour(s))  Blood Culture (routine x 2)     Status: Abnormal   Collection Time: 10/06/18  8:45 AM   Specimen: BLOOD  Result Value Ref Range Status  Specimen Description BLOOD LEFT ANTECUBITAL  Final   Special Requests   Final    BOTTLES DRAWN AEROBIC AND ANAEROBIC Blood Culture adequate volume   Culture  Setup Time   Final    IN BOTH AEROBIC AND ANAEROBIC BOTTLES GRAM NEGATIVE RODS CRITICAL VALUE NOTED.  VALUE IS CONSISTENT WITH PREVIOUSLY REPORTED AND CALLED VALUE.    Culture (A)  Final    ENTEROBACTER CLOACAE SUSCEPTIBILITIES PERFORMED ON PREVIOUS CULTURE WITHIN THE LAST 5 DAYS. Performed at [facility], [place] [address] [place], [place] [address]    Report Status 10/09/2018 FINAL  Final  SARS Coronavirus 2     Status: None   Collection Time: 10/06/18  8:49 AM  Result Value Ref Range Status   SARS Coronavirus 2 NOT DETECTED NOT DETECTED Final    Comment: (NOTE) SARS-CoV-2 target nucleic acids are NOT DETECTED. The SARS-CoV-2 RNA is generally detectable in upper and lower respiratory specimens during the acute phase of infection.  Negative  results do not preclude SARS-CoV-2 infection, do not rule out co-infections with other pathogens, and should not be used as the sole basis for treatment or other patient management decisions.  Negative results must be combined with clinical observations, patient history, and epidemiological information. The expected result is Not Detected. Fact Sheet for Patients: http://www.biofiredefense.com/wp-content/uploads/2020/03/BIOFIRE-COVID -19-patients.pdf Fact Sheet for Healthcare Providers: http://www.biofiredefense.com/wp-content/uploads/2020/03/BIOFIRE-COVID -19-hcp.pdf This test is not yet approved or cleared by the [place] and  has been authorized for detection and/or diagnosis of SARS-CoV-2 by FDA under an Emergency Use Authorization  (EUA).  This EUA will remain in effec t (meaning this test can be used) for the duration of  the COVID-19 declaration under Section 564(b)(1) of the Act, 21 U.S.C. section 360bbb-3(b)(1), unless the authorization is terminated or revoked sooner. Performed at [facility], [place] [address], [place], [place] [address]   Blood Culture (routine x 2)     Status: Abnormal   Collection Time: 10/06/18  8:50 AM   Specimen: BLOOD  Result Value Ref Range Status   Specimen Description BLOOD BLOOD LEFT WRIST  Final   Special Requests   Final    BOTTLES DRAWN AEROBIC AND ANAEROBIC Blood Culture adequate volume   Culture  Setup Time   Final    GRAM NEGATIVE RODS CRITICAL RESULT CALLED TO, READ BACK BY AND VERIFIED WITH: PHRMD L SEAY '@[id number]'  10/07/18 BY S GEZAHEGN IN BOTH AEROBIC AND ANAEROBIC BOTTLES Performed at [facility], [place] [address] [place], [place] [address]    Culture ENTEROBACTER CLOACAE (A)  Final   Report Status 10/09/2018 FINAL  Final   Organism ID, Bacteria ENTEROBACTER CLOACAE  Final      Susceptibility   Enterobacter cloacae - MIC*    CEFAZOLIN RESISTANT Resistant     CEFEPIME <=1 SENSITIVE Sensitive     CEFTAZIDIME <=1 SENSITIVE Sensitive     CEFTRIAXONE <=1 SENSITIVE Sensitive     CIPROFLOXACIN <=0.25 SENSITIVE Sensitive     GENTAMICIN <=1 SENSITIVE Sensitive     IMIPENEM <=0.25 SENSITIVE Sensitive     TRIMETH/SULFA <=20 SENSITIVE Sensitive     PIP/TAZO <=4 SENSITIVE Sensitive     * ENTEROBACTER CLOACAE  Blood Culture ID Panel (Reflexed)     Status: Abnormal   Collection Time: 10/06/18  8:50 AM  Result Value Ref Range Status   Enterococcus species NOT DETECTED NOT DETECTED Final   Listeria monocytogenes NOT DETECTED NOT DETECTED Final   Staphylococcus species NOT DETECTED NOT DETECTED Final  Staphylococcus aureus (BCID) NOT DETECTED NOT DETECTED Final   Streptococcus species NOT DETECTED NOT DETECTED Final   Streptococcus agalactiae NOT DETECTED NOT DETECTED  Final   Streptococcus pneumoniae NOT DETECTED NOT DETECTED Final   Streptococcus pyogenes NOT DETECTED NOT DETECTED Final   Acinetobacter baumannii NOT DETECTED NOT DETECTED Final   Enterobacteriaceae species DETECTED (A) NOT DETECTED Final    Comment: Enterobacteriaceae represent a large family of gram-negative bacteria, not a single organism. CRITICAL RESULT CALLED TO, READ BACK BY AND VERIFIED WITH: PHRMD L SEAY '@[id number]'  10/07/18 BY S GEZAHEGN    Enterobacter cloacae complex DETECTED (A) NOT DETECTED Final    Comment: CRITICAL RESULT CALLED TO, READ BACK BY AND VERIFIED WITH: PHRMD L SEAY '@[id number]'  10/07/18 BY S GEZAHEGN    Escherichia coli NOT DETECTED NOT DETECTED Final   Klebsiella oxytoca NOT DETECTED NOT DETECTED Final   Klebsiella pneumoniae NOT DETECTED NOT DETECTED Final   Proteus species NOT DETECTED NOT DETECTED Final   Serratia marcescens NOT DETECTED NOT DETECTED Final   Carbapenem resistance NOT DETECTED NOT DETECTED Final   Haemophilus influenzae NOT DETECTED NOT DETECTED Final   Neisseria meningitidis NOT DETECTED NOT DETECTED Final   Pseudomonas aeruginosa NOT DETECTED NOT DETECTED Final   Candida albicans NOT DETECTED NOT DETECTED Final   Candida glabrata NOT DETECTED NOT DETECTED Final   Candida krusei NOT DETECTED NOT DETECTED Final   Candida parapsilosis NOT DETECTED NOT DETECTED Final   Candida tropicalis NOT DETECTED NOT DETECTED Final    Comment: Performed at [facility], [place] [address], [place], [place] [address]  Urine culture     Status: Abnormal   Collection Time: 10/06/18 10:10 AM   Specimen: Urine, Random  Result Value Ref Range Status   Specimen Description URINE, RANDOM  Final   Special Requests   Final    Immunocompromised Performed at [facility], [place] [address], [place], [place] [address]    Culture >=100,000 COLONIES/mL ENTEROBACTER CLOACAE (A)  Final   Report Status 10/10/2018 FINAL  Final   Organism ID, Bacteria ENTEROBACTER CLOACAE  (A)  Final      Susceptibility   Enterobacter cloacae - MIC*    CEFAZOLIN >=64 RESISTANT Resistant     CEFTRIAXONE <=1 SENSITIVE Sensitive     CIPROFLOXACIN <=0.25 SENSITIVE Sensitive     GENTAMICIN <=1 SENSITIVE Sensitive     IMIPENEM 0.5 SENSITIVE Sensitive     NITROFURANTOIN INTERMEDIATE Intermediate     TRIMETH/SULFA <=20 SENSITIVE Sensitive     PIP/TAZO <=4 SENSITIVE Sensitive     * >=100,000 COLONIES/mL ENTEROBACTER CLOACAE  MRSA PCR Screening     Status: None   Collection Time: 10/06/18  6:11 PM   Specimen: Nasal Mucosa; Nasopharyngeal  Result Value Ref Range Status   MRSA by PCR NEGATIVE NEGATIVE Final    Comment:        The GeneXpert MRSA Assay (FDA approved for NASAL specimens only), is one component of a comprehensive MRSA colonization surveillance program. It is not intended to diagnose MRSA infection nor to guide or monitor treatment for MRSA infections. Performed at [facility], [place] [address], [place], [place] [address]          Radiology Studies: No results found.      Scheduled Meds: . amLODipine  10 mg Oral Daily  . cloNIDine  0.2 mg Oral BID  . insulin aspart  0-9 Units Subcutaneous TID WC  . insulin detemir  8 Units Subcutaneous BID  .  polyethylene glycol  17 g Oral Daily   Continuous Infusions: . sodium chloride 75 mL/hr at 10/14/18 0700  . ceFEPime (MAXIPIME) IV 1 g (10/13/18 1303)     LOS: 8 days   The patient is critically ill with multiple organ systems failure and requires high complexity decision making for assessment and support, frequent evaluation and titration of therapies, application of advanced monitoring technologies and extensive interpretation of multiple databases. Critical Care Time devoted to patient care services described in this note  Time spent: 40 minutes     Caniya Tagle, Geraldo Docker, MD [facility] Pager [phone]  If 7PM-7AM, please contact night-coverage [email or web address] Password [facility] 10/14/2018,  7:48 AM

## 2018-10-14 NOTE — Progress Notes (Signed)
Occupational Therapy Treatment Patient Details Name: Allison Patel MRN: [id number] DOB: 12-Jun-1955 Today's Date: 10/14/2018    History of present illness 63 year old woman with DM, HTN, prior gluteal abscess presenting with confusion since Sunday ~3 days. hx DM, HTN p/w severe sepsis secondary to enterobacter pyelnonephritis with hematogenous spread.   OT comments  Pt agreeable to grooming at sink. Performed 2 activities in standing with min guard assist. Instructed pt in benefits of OOB activity with pt agreeing to get up to chair for all 3 meals as now she is not getting up to use the bathroom as much with a new foley placed. Improved cognition.  Follow Up Recommendations  Home health OT;Supervision/Assistance - 24 hour    Equipment Recommendations  3 in 1 bedside commode    Recommendations for Other Services      Precautions / Restrictions Precautions Precautions: Fall Precaution Comments: foley placed 6/24       Mobility Bed Mobility Overal bed mobility: Needs Assistance Bed Mobility: Supine to Sit;Sit to Supine     Supine to sit: Supervision Sit to supine: Supervision   General bed mobility comments: No assist needed  Transfers Overall transfer level: Needs assistance Equipment used: Rolling walker (2 wheeled) Transfers: Sit to/from Stand Sit to Stand: Min guard              Balance Overall balance assessment: Needs assistance   Sitting balance-Leahy Scale: Fair       Standing balance-Leahy Scale: Fair Standing balance comment: standing at sink                           ADL either performed or assessed with clinical judgement   ADL Overall ADL's : Needs assistance/impaired     Grooming: Oral care;Brushing hair;Standing;Min guard           Upper Body Dressing : Minimal assistance;Sitting                   Functional mobility during ADLs: Min guard;Rolling walker       Vision       Perception     Praxis       Cognition Arousal/Alertness: Awake/alert Behavior During Therapy: WFL for tasks assessed/performed   Area of Impairment: Problem solving                             Problem Solving: Decreased initiation General Comments: pt with improved memory about events of at least the last 24 hours        Exercises     Shoulder Instructions       General Comments      Pertinent Vitals/ Pain       Pain Assessment: No/denies pain  Home Living                                          Prior Functioning/Environment              Frequency  Min 2X/week        Progress Toward Goals  OT Goals(current goals can now be found in the care plan section)  Progress towards OT goals: Progressing toward goals  Acute Rehab OT Goals Patient Stated Goal: to go home OT Goal Formulation: With patient Time For Goal Achievement: 10/21/18 Potential to  Achieve Goals: Good  Plan Discharge plan needs to be updated;Frequency remains appropriate    Co-evaluation                 AM-PAC OT "6 Clicks" Daily Activity     Outcome Measure   Help from another person eating meals?: None Help from another person taking care of personal grooming?: A Little Help from another person toileting, which includes using toliet, bedpan, or urinal?: A Lot Help from another person bathing (including washing, rinsing, drying)?: A Little Help from another person to put on and taking off regular upper body clothing?: A Little Help from another person to put on and taking off regular lower body clothing?: A Lot 6 Click Score: 17    End of Session Equipment Utilized During Treatment: Rolling walker;Gait belt  OT Visit Diagnosis: Unsteadiness on feet (R26.81);Other symptoms and signs involving cognitive function;Muscle weakness (generalized) (M62.81)   Activity Tolerance Patient tolerated treatment well   Patient Left in bed;with call bell/phone within reach   Nurse  Communication          Time: 7510-2585 OT Time Calculation (min): 19 min  Charges: OT General Charges $OT Visit: 1 Visit OT Treatments $Self Care/Home Management : 8-22 mins  Nestor Lewandowsky, OTR/L Acute Rehabilitation Services Pager: [phone] Office: [phone]   Malka So 10/14/2018, 11:07 AM

## 2018-10-14 NOTE — Progress Notes (Signed)
Echocardiogram 2D Echocardiogram has been performed.  Oneal Deputy Dawon Troop 10/14/2018, 3:42 PM

## 2018-10-15 ENCOUNTER — Other Ambulatory Visit: Payer: Self-pay | Admitting: Internal Medicine

## 2018-10-15 DIAGNOSIS — R011 Cardiac murmur, unspecified: Secondary | ICD-10-CM

## 2018-10-15 DIAGNOSIS — E785 Hyperlipidemia, unspecified: Secondary | ICD-10-CM | POA: Diagnosis present

## 2018-10-15 LAB — PROTEIN ELECTROPHORESIS, SERUM
A/G Ratio: 0.5 — ABNORMAL LOW (ref 0.7–1.7)
Albumin ELP: 1.8 g/dL — ABNORMAL LOW (ref 2.9–4.4)
Alpha-1-Globulin: 0.4 g/dL (ref 0.0–0.4)
Alpha-2-Globulin: 0.9 g/dL (ref 0.4–1.0)
Beta Globulin: 0.9 g/dL (ref 0.7–1.3)
Gamma Globulin: 1.1 g/dL (ref 0.4–1.8)
Globulin, Total: 3.3 g/dL (ref 2.2–3.9)
Total Protein ELP: 5.1 g/dL — ABNORMAL LOW (ref 6.0–8.5)

## 2018-10-15 LAB — GLUCOSE, CAPILLARY
Glucose-Capillary: 140 mg/dL — ABNORMAL HIGH (ref 70–99)
Glucose-Capillary: 126 mg/dL — ABNORMAL HIGH (ref 70–99)
Glucose-Capillary: 100 mg/dL — ABNORMAL HIGH (ref 70–99)
Glucose-Capillary: 183 mg/dL — ABNORMAL HIGH (ref 70–99)

## 2018-10-15 LAB — RENAL FUNCTION PANEL
Albumin: 1.4 g/dL — ABNORMAL LOW (ref 3.5–5.0)
Anion gap: 9 (ref 5–15)
BUN: 78 mg/dL — ABNORMAL HIGH (ref 8–23)
CO2: 17 mmol/L — ABNORMAL LOW (ref 22–32)
Calcium: 7.5 mg/dL — ABNORMAL LOW (ref 8.9–10.3)
Chloride: 105 mmol/L (ref 98–111)
Creatinine, Ser: 4.18 mg/dL — ABNORMAL HIGH (ref 0.44–1.00)
GFR calc Af Amer: 12 mL/min — ABNORMAL LOW (ref >60)
GFR calc non Af Amer: 11 mL/min — ABNORMAL LOW (ref >60)
Glucose, Bld: 149 mg/dL — ABNORMAL HIGH (ref 70–99)
Phosphorus: 4.9 mg/dL — ABNORMAL HIGH (ref 2.5–4.6)
Potassium: 4.4 mmol/L (ref 3.5–5.1)
Sodium: 131 mmol/L — ABNORMAL LOW (ref 135–145)

## 2018-10-15 LAB — CBC
HCT: 26.2 % — ABNORMAL LOW (ref 36.0–46.0)
Hemoglobin: 8.7 g/dL — ABNORMAL LOW (ref 12.0–15.0)
MCH: 28.1 pg (ref 26.0–34.0)
MCHC: 33.2 g/dL (ref 30.0–36.0)
MCV: 84.5 fL (ref 80.0–100.0)
Platelets: 276 10*3/uL (ref 150–400)
RBC: 3.1 MIL/uL — ABNORMAL LOW (ref 3.87–5.11)
RDW: 15.5 % (ref 11.5–15.5)
WBC: 14.7 10*3/uL — ABNORMAL HIGH (ref 4.0–10.5)
nRBC: 0 % (ref 0.0–0.2)

## 2018-10-15 LAB — KAPPA/LAMBDA LIGHT CHAINS
Kappa free light chain: 178.9 mg/L — ABNORMAL HIGH (ref 3.3–19.4)
Kappa, lambda light chain ratio: 1.38 (ref 0.26–1.65)
Lambda free light chains: 129.9 mg/L — ABNORMAL HIGH (ref 5.7–26.3)

## 2018-10-15 LAB — LIPID PANEL
Cholesterol: 203 mg/dL — ABNORMAL HIGH (ref 0–200)
HDL: 15 mg/dL — ABNORMAL LOW (ref >40)
LDL Cholesterol: 143 mg/dL — ABNORMAL HIGH (ref 0–99)
Total CHOL/HDL Ratio: 13.5 RATIO
Triglycerides: 225 mg/dL — ABNORMAL HIGH (ref <150)
VLDL: 45 mg/dL — ABNORMAL HIGH (ref 0–40)

## 2018-10-15 LAB — MAGNESIUM: Magnesium: 2 mg/dL (ref 1.7–2.4)

## 2018-10-15 MED ORDER — FERROUS SULFATE 325 (65 FE) MG PO TABS
325.0000 mg | ORAL_TABLET | Freq: Every day | ORAL | Status: DC
  Administered 2018-10-16 – 2018-10-22 (×6): 325 mg via ORAL
  Filled 2018-10-15 (×11): qty 1

## 2018-10-15 MED ORDER — ATORVASTATIN CALCIUM 40 MG PO TABS
40.0000 mg | ORAL_TABLET | Freq: Every day | ORAL | Status: DC
  Administered 2018-10-15 – 2018-10-22 (×8): 40 mg via ORAL
  Filled 2018-10-15 (×8): qty 1

## 2018-10-15 NOTE — Progress Notes (Signed)
PROGRESS NOTE    Allison Patel  QIW:[id number] DOB: 28-Sep-1955 DOA: 10/06/2018 PCP: Elby Showers, MD   Brief Narrative:  63 year old WF PMHX DM type II uncontrolled with complication,, HTN, murmur, prior gluteal abscess, HLD, hypothyroidism, morbid obesity,  Presenting with confusion since Sunday ~3 days. Initially got better with PO fluids but got worse last night into day of admission. Admission labs notable for +UA, actidosis, acute kidney injury, and lactic acidemia. Lactate and mental status did not improve with fluids and abx in ER so intensivist asked to admit.     Subjective: 6/25 A/O x4, negative S OB, negative abdominal pain, negative CP, negative N/V.  Sitting in chair comfortably.    Assessment & Plan:   Principal Problem:   Severe sepsis ([place]) Active Problems:   Benign essential HTN   Hypercholesterolemia without hypertriglyceridemia   Adult hypothyroidism   Diabetes mellitus type 2, uncontrolled (HCC)   HLD (hyperlipidemia)  Severe sepsis/MODS -Multifactorial bacteremia, acute pyelonephritis.  Treat underlying cause,  Bacteremia positive ENTEROBACTER CLOACAE -Antibiotic changed to ciprofloxacin secondary to continued renal failure.  After discussion with pharmacy was determined that antibiotic would be good for bacteremia as well as acute pyelonephritis. - Complete 14-day course of antibiotics  Acute respiratory failure with hypoxia -Resolved, now on room air  Essential HTN -Amlodipine 10 mg daily -Clonidine 0.2 mg twice daily - Hydralazine PRN -6/24 metoprolol 12.5 mg twice daily  Cardiac murmur - Although not new considering patient has bacteremia will obtain echocardiogram   Acute pyelonephritis positive ENTEROBACTER CLOACAE -See bacteremia -Discussed case with nephrology renal ultrasound revealed no evidence of obstruction. -ANA/ANCA pending -SPEP and free light chains pending - Continue with Foley - Strict in and out +6.2 L -Per  nephrology currently no indication for acute dialysis  AKI/proteinuria/hematuria -Secondary to pyelonephritis, uncontrolled diabetes and hypertension may also have played a part.  In addition awaiting results of labs.. Recent Labs  Lab 10/11/18 0226 10/12/18 0231 10/13/18 0300 10/14/18 0212 10/15/18 0316  CREATININE 3.27* 3.55* 4.04* 4.12* 4.18*   Diabetes type 2 uncontrolled with complication - 4/17 hemoglobin A1c= 14 - 6/24 increase Levemir 10 units twice daily - 6/24 start NovoLog 5 units q. AC - Sensitive SSI  HLD - LDL not within ADA/AHA guidelines - LDL goal<70 - 6/25 start Lipitor 40 mg daily  Anemia Recent Labs  Lab 10/11/18 0226 10/12/18 0231 10/13/18 0300 10/14/18 0212 10/15/18 0316  HGB 8.9* 9.0* 8.7* 8.4* 8.7*  -Stable -Anemia panel pending -Occult blood pending  Thrombocytopenia -Resolved  Anxiety -Currently not on Xanax while in hospital and and doing well.  Would not restart, would start SSRI if patient requires long-term medication.  If patient requires short-term medication would start diazepam or Ativan.  Morbid obesity - Diabetic nutritionist has seen patient -Body mass index is 35.16 kg/m.    DVT prophylaxis: SCD Code Status: Full Family Communication: None Disposition Plan: TBD   Consultants:  Nephrology PCCM  Procedures/Significant Events:  6/24 echocardiogram:Left Ventricle:- hyperdynamic systolic function.  LVEF>65%. -asymmetric left ventricular hypertrophy.  -Left ventricular diastolic Doppler parameters are consistent with pseudonormalization.      I have personally reviewed and interpreted all radiology studies and my findings are as above.  VENTILATOR SETTINGS:    Cultures 6/16 SARS coronavirus negative 6/16 blood left antecubital positive ENTEROBACTER CLOACAE 6/16 blood left wrist positive ENTEROBACTER CLOACAE 6/16 urine positive ENTEROBACTER CLOACAE 6/17 HIV negative 6/24 blood pending   Antimicrobials:  Anti-infectives (From admission, onward)   Start  Stop   10/15/18 1000  ciprofloxacin (CIPRO) tablet 500 mg     10/20/18 0959   10/13/18 1230  ceFEPIme (MAXIPIME) 1 g in sodium chloride 0.9 % 100 mL IVPB  Status:  Discontinued     10/14/18 1307   10/09/18 0930  ceFEPIme (MAXIPIME) 2 g in sodium chloride 0.9 % 100 mL IVPB  Status:  Discontinued     10/12/18 1106   10/09/18 0745  cefTRIAXone (ROCEPHIN) 2 g in sodium chloride 0.9 % 100 mL IVPB  Status:  Discontinued     10/09/18 0744   10/07/18 1000  ceFEPIme (MAXIPIME) 2 g in sodium chloride 0.9 % 100 mL IVPB  Status:  Discontinued     10/06/18 1330   10/07/18 0900  ceFEPIme (MAXIPIME) 2 g in sodium chloride 0.9 % 100 mL IVPB  Status:  Discontinued     10/09/18 0739   10/07/18 0800  cefTRIAXone (ROCEPHIN) 2 g in sodium chloride 0.9 % 100 mL IVPB  Status:  Discontinued     10/07/18 0326   10/06/18 1345  cefTRIAXone (ROCEPHIN) 2 g in sodium chloride 0.9 % 100 mL IVPB  Status:  Discontinued     10/06/18 1509   10/06/18 1051  vancomycin variable dose per unstable renal function (pharmacist dosing)  Status:  Discontinued     10/06/18 1334   10/06/18 0900  ceFEPIme (MAXIPIME) 2 g in sodium chloride 0.9 % 100 mL IVPB     10/06/18 0954   10/06/18 0900  metroNIDAZOLE (FLAGYL) IVPB 500 mg     10/06/18 1028   10/06/18 0900  vancomycin (VANCOCIN) IVPB 1000 mg/200 mL premix  Status:  Discontinued     10/06/18 0849   10/06/18 0900  vancomycin (VANCOCIN) 1,500 mg in sodium chloride 0.9 % 500 mL IVPB     10/06/18 1233      Devices    LINES / TUBES:      Continuous Infusions: . sodium chloride 75 mL/hr at 10/14/18 1700     Objective: Vitals:   10/15/18 0457 10/15/18 0755 10/15/18 1045 10/15/18 1502  BP: (!) 159/65 (!) 153/75 124/65 (!) 129/56  Pulse: 92     Resp: (!) 32 (!) 24 (!) 27 (!) 27  Temp: 98.4 F (36.9 C) 98.3 F (36.8 C) 98.5 F (36.9 C) 98.7 F (37.1 C)  TempSrc: Oral Oral Oral Oral  SpO2: 94%     Weight: 82.3 kg      Height:        Intake/Output Summary (Last 24 hours) at 10/15/2018 1857 Last data filed at 10/15/2018 1800 Gross per 24 hour  Intake 177 ml  Output 1325 ml  Net -1148 ml   Filed Weights   10/13/18 0500 10/14/18 0500 10/15/18 0457  Weight: 80.3 kg 80.6 kg 82.3 kg   Physical Exam:  General: A/O x4, no acute respiratory distress Eyes: negative scleral hemorrhage, negative anisocoria, negative icterus ENT: Negative Runny nose, negative gingival bleeding, Neck:  Negative scars, masses, torticollis, lymphadenopathy, JVD Lungs: Clear to auscultation bilaterally without wheezes or crackles Cardiovascular: Regular rate and rhythm without murmur gallop or rub normal S1 and S2 Abdomen: Morbidly obese, negative abdominal pain, nondistended, positive soft, bowel sounds, no rebound, no ascites, no appreciable mass Extremities: No significant cyanosis, clubbing, or edema bilateral lower extremities Skin: Negative rashes, lesions, ulcers Psychiatric:  Negative depression, negative anxiety, negative fatigue, negative mania  Central nervous system:  Cranial nerves II through XII intact, tongue/uvula midline, all extremities muscle strength 5/5, sensation intact  throughout,  negative dysarthria, negative expressive aphasia, negative receptive aphasia. .     Data Reviewed: Care during the described time interval was provided by me .  I have reviewed this patient's available data, including medical history, events of note, physical examination, and all test results as part of my evaluation.   CBC: Recent Labs  Lab 10/11/18 0226 10/12/18 0231 10/13/18 0300 10/14/18 0212 10/15/18 0316  WBC 9.1 11.7* 12.4* 13.0* 14.7*  NEUTROABS  --   --   --  10.2*  --   HGB 8.9* 9.0* 8.7* 8.4* 8.7*  HCT 26.3* 26.5* 25.8* 25.2* 26.2*  MCV 81.7 81.8 81.9 83.4 84.5  PLT 95* 168 211 245 287   Basic Metabolic Panel: Recent Labs  Lab 10/11/18 0226 10/12/18 0231 10/13/18 0300 10/14/18 0212 10/15/18 0316   NA 129* 128* 128* 130* 131*  K 4.1 3.8 4.3 4.2 4.4  CL 99 98 99 102 105  CO2 20* 20* 20* 17* 17*  GLUCOSE 166* 244* 197* 182* 149*  BUN 68* 73* 77* 78* 78*  CREATININE 3.27* 3.55* 4.04* 4.12* 4.18*  CALCIUM 7.3* 7.4* 7.5* 7.3* 7.5*  MG  --   --   --   --  2.0  PHOS 1.8* 2.4* 3.4 4.1 4.9*   GFR: Estimated Creatinine Clearance: 13.1 mL/min (A) (by C-G formula based on SCr of 4.18 mg/dL (H)). Liver Function Tests: Recent Labs  Lab 10/09/18 0301 10/10/18 0306 10/11/18 0226 10/12/18 0231 10/13/18 0300 10/14/18 0212 10/15/18 0316  AST 79* 46*  --  21  --  17  --   ALT 61* 51*  --  29  --  20  --   ALKPHOS 268* 270*  --  239*  --  233*  --   BILITOT 3.9* 2.9*  --  1.7*  --  1.2  --   PROT 5.2* 5.1*  --  5.0*  --  5.1*  --   ALBUMIN 1.6*  1.6* 1.5*  1.4* 1.4* 1.4*  1.4* 1.3* 1.4*  1.4* 1.4*   No results for input(s): LIPASE, AMYLASE in the last 168 hours. No results for input(s): AMMONIA in the last 168 hours. Coagulation Profile: No results for input(s): INR, PROTIME in the last 168 hours. Cardiac Enzymes: No results for input(s): CKTOTAL, CKMB, CKMBINDEX, TROPONINI in the last 168 hours. BNP (last 3 results) No results for input(s): PROBNP in the last 8760 hours. HbA1C: No results for input(s): HGBA1C in the last 72 hours. CBG: Recent Labs  Lab 10/14/18 1648 10/14/18 2115 10/15/18 0622 10/15/18 1048 10/15/18 1541  GLUCAP 179* 157* 140* 126* 100*   Lipid Profile: Recent Labs    10/15/18 0316  CHOL 203*  HDL 15*  LDLCALC 143*  TRIG 225*  CHOLHDL 13.5   Thyroid Function Tests: No results for input(s): TSH, T4TOTAL, FREET4, T3FREE, THYROIDAB in the last 72 hours. Anemia Panel: No results for input(s): VITAMINB12, FOLATE, FERRITIN, TIBC, IRON, RETICCTPCT in the last 72 hours. Urine analysis:    Component Value Date/Time   COLORURINE YELLOW 10/12/2018 1115   APPEARANCEUR CLOUDY (A) 10/12/2018 1115   LABSPEC 1.010 10/12/2018 1115   PHURINE 5.0 10/12/2018  1115   GLUCOSEU 150 (A) 10/12/2018 1115   HGBUR MODERATE (A) 10/12/2018 1115   BILIRUBINUR NEGATIVE 10/12/2018 1115   BILIRUBINUR NEG 07/03/2017 1527   KETONESUR NEGATIVE 10/12/2018 1115   PROTEINUR 100 (A) 10/12/2018 1115   UROBILINOGEN 0.2 07/03/2017 1527   NITRITE NEGATIVE 10/12/2018 1115   LEUKOCYTESUR LARGE (A)  10/12/2018 1115   Sepsis Labs: @LABRCNTIP (procalcitonin:4,lacticidven:4)  ) Recent Results (from the past 240 hour(s))  Blood Culture (routine x 2)     Status: Abnormal   Collection Time: 10/06/18  8:45 AM   Specimen: BLOOD  Result Value Ref Range Status   Specimen Description BLOOD LEFT ANTECUBITAL  Final   Special Requests   Final    BOTTLES DRAWN AEROBIC AND ANAEROBIC Blood Culture adequate volume   Culture  Setup Time   Final    IN BOTH AEROBIC AND ANAEROBIC BOTTLES GRAM NEGATIVE RODS CRITICAL VALUE NOTED.  VALUE IS CONSISTENT WITH PREVIOUSLY REPORTED AND CALLED VALUE.    Culture (A)  Final    ENTEROBACTER CLOACAE SUSCEPTIBILITIES PERFORMED ON PREVIOUS CULTURE WITHIN THE LAST 5 DAYS. Performed at [facility], [place] [address], [place], [place] [address]    Report Status 10/09/2018 FINAL  Final  SARS Coronavirus 2     Status: None   Collection Time: 10/06/18  8:49 AM  Result Value Ref Range Status   SARS Coronavirus 2 NOT DETECTED NOT DETECTED Final    Comment: (NOTE) SARS-CoV-2 target nucleic acids are NOT DETECTED. The SARS-CoV-2 RNA is generally detectable in upper and lower respiratory specimens during the acute phase of infection.  Negative  results do not preclude SARS-CoV-2 infection, do not rule out co-infections with other pathogens, and should not be used as the sole basis for treatment or other patient management decisions.  Negative results must be combined with clinical observations, patient history, and epidemiological information. The expected result is Not Detected. Fact Sheet for Patients:  http://www.biofiredefense.com/wp-content/uploads/2020/03/BIOFIRE-COVID -19-patients.pdf Fact Sheet for Healthcare Providers: http://www.biofiredefense.com/wp-content/uploads/2020/03/BIOFIRE-COVID -19-hcp.pdf This test is not yet approved or cleared by the [place] and  has been authorized for detection and/or diagnosis of SARS-CoV-2 by FDA under an Emergency Use Authorization (EUA).  This EUA will remain in effec t (meaning this test can be used) for the duration of  the COVID-19 declaration under Section 564(b)(1) of the Act, 21 U.S.C. section 360bbb-3(b)(1), unless the authorization is terminated or revoked sooner. Performed at [facility], [place] [address] [place], [place] [address]   Blood Culture (routine x 2)     Status: Abnormal   Collection Time: 10/06/18  8:50 AM   Specimen: BLOOD  Result Value Ref Range Status   Specimen Description BLOOD BLOOD LEFT WRIST  Final   Special Requests   Final    BOTTLES DRAWN AEROBIC AND ANAEROBIC Blood Culture adequate volume   Culture  Setup Time   Final    GRAM NEGATIVE RODS CRITICAL RESULT CALLED TO, READ BACK BY AND VERIFIED WITH: PHRMD L SEAY @0318  10/07/18 BY S GEZAHEGN IN BOTH AEROBIC AND ANAEROBIC BOTTLES Performed at [facility], [place] [address] [place],  [address]    Culture ENTEROBACTER CLOACAE (A)  Final   Report Status 10/09/2018 FINAL  Final   Organism ID, Bacteria ENTEROBACTER CLOACAE  Final      Susceptibility   Enterobacter cloacae - MIC*    CEFAZOLIN RESISTANT Resistant     CEFEPIME <=1 SENSITIVE Sensitive     CEFTAZIDIME <=1 SENSITIVE Sensitive     CEFTRIAXONE <=1 SENSITIVE Sensitive     CIPROFLOXACIN <=0.25 SENSITIVE Sensitive     GENTAMICIN <=1 SENSITIVE Sensitive     IMIPENEM <=0.25 SENSITIVE Sensitive     TRIMETH/SULFA <=20 SENSITIVE Sensitive     PIP/TAZO <=4 SENSITIVE Sensitive     * ENTEROBACTER CLOACAE  Blood Culture ID Panel (  Reflexed)     Status: Abnormal   Collection Time:  10/06/18  8:50 AM  Result Value Ref Range Status   Enterococcus species NOT DETECTED NOT DETECTED Final   Listeria monocytogenes NOT DETECTED NOT DETECTED Final   Staphylococcus species NOT DETECTED NOT DETECTED Final   Staphylococcus aureus (BCID) NOT DETECTED NOT DETECTED Final   Streptococcus species NOT DETECTED NOT DETECTED Final   Streptococcus agalactiae NOT DETECTED NOT DETECTED Final   Streptococcus pneumoniae NOT DETECTED NOT DETECTED Final   Streptococcus pyogenes NOT DETECTED NOT DETECTED Final   Acinetobacter baumannii NOT DETECTED NOT DETECTED Final   Enterobacteriaceae species DETECTED (A) NOT DETECTED Final    Comment: Enterobacteriaceae represent a large family of gram-negative bacteria, not a single organism. CRITICAL RESULT CALLED TO, READ BACK BY AND VERIFIED WITH: PHRMD L SEAY @0318  10/07/18 BY S GEZAHEGN    Enterobacter cloacae complex DETECTED (A) NOT DETECTED Final    Comment: CRITICAL RESULT CALLED TO, READ BACK BY AND VERIFIED WITH: PHRMD L SEAY @0318  10/07/18 BY S GEZAHEGN    Escherichia coli NOT DETECTED NOT DETECTED Final   Klebsiella oxytoca NOT DETECTED NOT DETECTED Final   Klebsiella pneumoniae NOT DETECTED NOT DETECTED Final   Proteus species NOT DETECTED NOT DETECTED Final   Serratia marcescens NOT DETECTED NOT DETECTED Final   Carbapenem resistance NOT DETECTED NOT DETECTED Final   Haemophilus influenzae NOT DETECTED NOT DETECTED Final   Neisseria meningitidis NOT DETECTED NOT DETECTED Final   Pseudomonas aeruginosa NOT DETECTED NOT DETECTED Final   Candida albicans NOT DETECTED NOT DETECTED Final   Candida glabrata NOT DETECTED NOT DETECTED Final   Candida krusei NOT DETECTED NOT DETECTED Final   Candida parapsilosis NOT DETECTED NOT DETECTED Final   Candida tropicalis NOT DETECTED NOT DETECTED Final    Comment: Performed at [facility], [place] [address] [place], [place] [address]  Urine culture     Status: Abnormal   Collection Time:  10/06/18 10:10 AM   Specimen: Urine, Random  Result Value Ref Range Status   Specimen Description URINE, RANDOM  Final   Special Requests   Final    Immunocompromised Performed at [facility], [place] [address] [place], [place] [address]    Culture >=100,000 COLONIES/mL ENTEROBACTER CLOACAE (A)  Final   Report Status 10/10/2018 FINAL  Final   Organism ID, Bacteria ENTEROBACTER CLOACAE (A)  Final      Susceptibility   Enterobacter cloacae - MIC*    CEFAZOLIN >=64 RESISTANT Resistant     CEFTRIAXONE <=1 SENSITIVE Sensitive     CIPROFLOXACIN <=0.25 SENSITIVE Sensitive     GENTAMICIN <=1 SENSITIVE Sensitive     IMIPENEM 0.5 SENSITIVE Sensitive     NITROFURANTOIN INTERMEDIATE Intermediate     TRIMETH/SULFA <=20 SENSITIVE Sensitive     PIP/TAZO <=4 SENSITIVE Sensitive     * >=100,000 COLONIES/mL ENTEROBACTER CLOACAE  MRSA PCR Screening     Status: None   Collection Time: 10/06/18  6:11 PM   Specimen: Nasal Mucosa; Nasopharyngeal  Result Value Ref Range Status   MRSA by PCR NEGATIVE NEGATIVE Final    Comment:        The GeneXpert MRSA Assay (FDA approved for NASAL specimens only), is one component of a comprehensive MRSA colonization surveillance program. It is not intended to diagnose MRSA infection nor to guide or monitor treatment for MRSA infections. Performed at [facility], [place] [address], [place], [place] [address]   Culture, blood (routine  x 2)     Status: None (Preliminary result)   Collection Time: 10/14/18  9:00 AM   Specimen: BLOOD  Result Value Ref Range Status   Specimen Description BLOOD RIGHT ANTECUBITAL  Final   Special Requests   Final    BOTTLES DRAWN AEROBIC ONLY Blood Culture results may not be optimal due to an inadequate volume of blood received in culture bottles   Culture   Final    NO GROWTH 1 DAY Performed at [facility], [place] [address], [place], [place] [address]    Report Status PENDING  Incomplete  Culture, blood (routine x  2)     Status: None (Preliminary result)   Collection Time: 10/14/18  9:08 AM   Specimen: BLOOD RIGHT HAND  Result Value Ref Range Status   Specimen Description BLOOD RIGHT HAND  Final   Special Requests   Final    BOTTLES DRAWN AEROBIC ONLY Blood Culture adequate volume   Culture   Final    NO GROWTH 1 DAY Performed at [facility], [place] [address] [place], [place] [address]    Report Status PENDING  Incomplete         Radiology Studies: No results found.      Scheduled Meds: . amLODipine  10 mg Oral Daily  . atorvastatin  40 mg Oral q1800  . ciprofloxacin  500 mg Oral Daily  . cloNIDine  0.2 mg Oral BID  . [START ON 10/16/2018] ferrous sulfate  325 mg Oral Q breakfast  . insulin aspart  0-9 Units Subcutaneous TID WC  . insulin aspart  5 Units Subcutaneous TID WC  . insulin detemir  10 Units Subcutaneous BID  . metoprolol tartrate  12.5 mg Oral BID  . polyethylene glycol  17 g Oral Daily   Continuous Infusions: . sodium chloride 75 mL/hr at 10/14/18 1700     LOS: 9 days   The patient is critically ill with multiple organ systems failure and requires high complexity decision making for assessment and support, frequent evaluation and titration of therapies, application of advanced monitoring technologies and extensive interpretation of multiple databases. Critical Care Time devoted to patient care services described in this note  Time spent: 40 minutes     Cris Talavera, Geraldo Docker, MD [facility] Pager [phone]  If 7PM-7AM, please contact night-coverage [email or web address] Password [facility] 10/15/2018, 6:57 PM

## 2018-10-15 NOTE — Progress Notes (Signed)
Physical Therapy Treatment Patient Details Name: Allison Patel MRN: [id number] DOB: [birth year] Today's Date: 10/15/2018    History of Present Illness 63 year old woman with DM, HTN, prior gluteal abscess presenting with confusion since Sunday ~3 days. hx DM, HTN p/w severe sepsis secondary to enterobacter pyelnonephritis with hematogenous spread.    PT Comments    Pt is agreeable to ambulation with therapy and sitting up in chair to eat lunch. Pt is limited in safe mobility by decreased motivation, as well as decreased strength and endurance. Pt currently is supervision for bed mobility and min guard for transfers and ambulation of 100 feet with RW. Pt self limiting with ambulation distance, however is agreeable to therapeutic exercise when seated in recliner. D/c plans remain appropriate at this time. PT will continue to follow acutely.   Follow Up Recommendations  Home health PT;Supervision/Assistance - 24 hour     Equipment Recommendations  Rolling walker with 5" wheels;3in1 (PT)(TBA)       Precautions / Restrictions Precautions Precautions: Fall Restrictions Weight Bearing Restrictions: No    Mobility  Bed Mobility Overal bed mobility: Needs Assistance Bed Mobility: Supine to Sit     Supine to sit: Supervision     General bed mobility comments: No assist needed  Transfers Overall transfer level: Needs assistance Equipment used: Rolling walker (2 wheeled) Transfers: Sit to/from Stand Sit to Stand: Min guard         General transfer comment: min guard for safety, vc for correct hand placement for powerup  Ambulation/Gait Ambulation/Gait assistance: Min guard Gait Distance (Feet): 100 Feet Assistive device: Rolling walker (2 wheeled) Gait Pattern/deviations: Step-through pattern;Decreased stride length;Staggering left;Wide base of support Gait velocity: slowed Gait velocity interpretation: 1.31 - 2.62 ft/sec, indicative of limited community ambulator General  Gait Details: min guard for safety with slow, guarded gait, self limiting         Balance Overall balance assessment: Needs assistance   Sitting balance-Leahy Scale: Fair       Standing balance-Leahy Scale: Fair Standing balance comment: requires at least singel UE support for static balance                            Cognition Arousal/Alertness: Awake/alert Behavior During Therapy: WFL for tasks assessed/performed Overall Cognitive Status: (NT formally) Area of Impairment: Problem solving                             Problem Solving: Decreased initiation        Exercises General Exercises - Lower Extremity Long Arc Quad: AROM;Both;10 reps;Seated Hip ABduction/ADduction: AROM;Both;10 reps;Standing Hip Flexion/Marching: AROM;Strengthening;Both;10 reps;Standing Toe Raises: AROM;10 reps;Standing Heel Raises: AROM;10 reps;Standing    General Comments General comments (skin integrity, edema, etc.): VSS      Pertinent Vitals/Pain Pain Assessment: No/denies pain           PT Goals (current goals can now be found in the care plan section) Acute Rehab PT Goals Patient Stated Goal: to go home PT Goal Formulation: With patient Time For Goal Achievement: 10/21/18 Potential to Achieve Goals: Good    Frequency    Min 3X/week      PT Plan Discharge plan needs to be updated       AM-PAC PT "6 Clicks" Mobility   Outcome Measure  Help needed turning from your back to your side while in a flat bed without using bedrails?: None  Help needed moving from lying on your back to sitting on the side of a flat bed without using bedrails?: None Help needed moving to and from a bed to a chair (including a wheelchair)?: A Little Help needed standing up from a chair using your arms (e.g., wheelchair or bedside chair)?: A Little Help needed to walk in hospital room?: A Little Help needed climbing 3-5 steps with a railing? : A Lot 6 Click Score: 19     End of Session   Activity Tolerance: Patient limited by fatigue Patient left: with call bell/phone within reach;in chair;with chair alarm set Nurse Communication: Mobility status PT Visit Diagnosis: Muscle weakness (generalized) (M62.81);Unsteadiness on feet (R26.81)     Time: 3007-6226 PT Time Calculation (min) (ACUTE ONLY): 20 min  Charges:                        Benjamine Mola B. Migdalia Dk PT, DPT Acute Rehabilitation Services Pager [phone] Office [phone]    [place] 10/15/2018, 12:04 PM

## 2018-10-15 NOTE — Progress Notes (Signed)
[facility]    NEPHROLOGY PROGRESS NOTE  SUBJECTIVE: Pt had 850 mL uop charted over 6/24.  She had a foley reinserted as bladder scan elevated 248 - 400 with no reported urge in setting of worsening AKI.  TTE with hypertrophic cardiomyopathy.  She states that at home her pressures were running 100/60 (clarified not 160) prior to admission and reports that that had been going on for a couple of months she thinks whenever she would check it.  Review of systems:   No shortness of breath or chest pain Denies n/v No rash  Appetite ok  Food tastes normal  No swelling    OBJECTIVE:  Vitals:   10/15/18 0024 10/15/18 0457  BP:  (!) 159/65  Pulse:  92  Resp: 20 (!) 32  Temp:  98.4 F (36.9 C)  SpO2:  94%    Intake/Output Summary (Last 24 hours) at 10/15/2018 0754 Last data filed at 10/15/2018 0459 Gross per 24 hour  Intake 1536.2 ml  Output 850 ml  Net 686.2 ml      General:  AAOx3 NAD HEENT: MMM [place] AT anicteric sclera Neck:  No JVD CV:  RRR ; no rub  Lungs clear and normal work of breathing  Abd:  abd SNT/ND Extremities: no edema lower extremities  Skin:  No skin rash GU - foley in place   MEDICATIONS:  . amLODipine  10 mg Oral Daily  . ciprofloxacin  500 mg Oral Daily  . cloNIDine  0.2 mg Oral BID  . insulin aspart  0-9 Units Subcutaneous TID WC  . insulin aspart  5 Units Subcutaneous TID WC  . insulin detemir  10 Units Subcutaneous BID  . metoprolol tartrate  12.5 mg Oral BID  . polyethylene glycol  17 g Oral Daily       LABS:   CBC Latest Ref Rng & Units 10/15/2018 10/14/2018 10/13/2018  WBC 4.0 - 10.5 K/uL 14.7(H) 13.0(H) 12.4(H)  Hemoglobin 12.0 - 15.0 g/dL 8.7(L) 8.4(L) 8.7(L)  Hematocrit 36.0 - 46.0 % 26.2(L) 25.2(L) 25.8(L)  Platelets 150 - 400 K/uL 276 245 211    CMP Latest Ref Rng & Units 10/15/2018 10/14/2018 10/13/2018  Glucose 70 - 99 mg/dL 149(H) 182(H) 197(H)  BUN 8 - 23 mg/dL 78(H) 78(H) 77(H)  Creatinine 0.44 - 1.00 mg/dL 4.18(H)  4.12(H) 4.04(H)  Sodium 135 - 145 mmol/L 131(L) 130(L) 128(L)  Potassium 3.5 - 5.1 mmol/L 4.4 4.2 4.3  Chloride 98 - 111 mmol/L 105 102 99  CO2 22 - 32 mmol/L 17(L) 17(L) 20(L)  Calcium 8.9 - 10.3 mg/dL 7.5(L) 7.3(L) 7.5(L)  Total Protein 6.5 - 8.1 g/dL - 5.1(L) -  Total Bilirubin 0.3 - 1.2 mg/dL - 1.2 -  Alkaline Phos 38 - 126 U/L - 233(H) -  AST 15 - 41 U/L - 17 -  ALT 0 - 44 U/L - 20 -    Lab Results  Component Value Date   CALCIUM 7.5 (L) 10/15/2018   CAION 0.94 (L) 10/06/2018   PHOS 4.9 (H) 10/15/2018       Component Value Date/Time   COLORURINE YELLOW 10/12/2018 1115   APPEARANCEUR CLOUDY (A) 10/12/2018 1115   LABSPEC 1.010 10/12/2018 1115   PHURINE 5.0 10/12/2018 1115   GLUCOSEU 150 (A) 10/12/2018 1115   HGBUR MODERATE (A) 10/12/2018 1115   BILIRUBINUR NEGATIVE 10/12/2018 1115   BILIRUBINUR NEG 07/03/2017 1527   KETONESUR NEGATIVE 10/12/2018 1115   PROTEINUR 100 (A) 10/12/2018 1115   UROBILINOGEN 0.2 07/03/2017  1527   NITRITE NEGATIVE 10/12/2018 1115   LEUKOCYTESUR LARGE (A) 10/12/2018 1115      Component Value Date/Time   HCO3 17.4 (L) 10/06/2018 1115   TCO2 18 (L) 10/06/2018 1115   ACIDBASEDEF 8.0 (H) 10/06/2018 1115   O2SAT 65.0 10/06/2018 1115       Component Value Date/Time   IRON 7 (L) 10/07/2018 1426   TIBC 167 (L) 10/07/2018 1426   FERRITIN 740 (H) 10/08/2018 0957   IRONPCTSAT 4 (L) 10/07/2018 1426       ASSESSMENT/PLAN:    63 year old female patient with past medical history of diabetes, hypertension, and hypothyroidism presented for sepsis secondary to pyelonephritis.    1.  Acute kidney injury.  Previously felt secondary to pre-renal and ischemic insults in the setting of sepsis and pyelo.  Hypotension at home and on ARB-HCTZ prior to admit in setting of AKI.  Limited reserve with uncontrolled diabetes at baseline at HbA1c over 14. With worsening inpatient cannot r/o AIN and have changed antibiotics 2/2 same.  Received short duration of vanc -  now off.  Presented with Cr 2.89 and Note baseline serum creatinine 0.85 from 03/16/2018.  Renal ultrasound revealed no evidence of obstruction.  There is a stone in the mid to lower left kidney.   - Continue normal saline at 75 ml/hr  - Continue foley catheter for retention and AKI  - Check urine eos.  Minimal serum eos  - No acute indication for dialysis; monitoring for needs and patient would want dialysis if she develops a need   - She has transitioned off of cephalosporin to cipro in the event this is contributing - GN work-up as below  2. Proteinuria  - 3240 mg/g up/cr ratio.  Feel secondary to diabetes with hemoglobin A1c over 14 - Spep and free light chains sent.  Random UPEP ordered  3. Hematuria - Setting of infection as well as stone and improving on repeat.  Note hematuria on 06/2017 study as well.  Complement normal.  With pyelo not ideal for renal biopsy  - ANA, ANCA negative  4.  Pyelonephritis.  On cipro per primary team   5. Bacteremia - enterobacter.  Repeat blood cultures from 6/24 are in process.  on abx per primary team.  Primary team has obtained a TTE.  If clinical concern for endocarditis TTE report recommends a TEE   6.  Diabetes mellitus.  Per primary team   7..  Anemia - microcytic with iron deficiency.  Defer IV iron with bacteremia.  On oral iron.  Stable.  No acute indication for PRBC's  8.  Thrombocytopenia.  Resolved.  Felt likely related to sepsis.    9.  Hypertension.  Continue current regimen for now.  Avoid hypotension.    Claudia Desanctis 10/15/2018 7:54 AM

## 2018-10-16 DIAGNOSIS — E11649 Type 2 diabetes mellitus with hypoglycemia without coma: Secondary | ICD-10-CM

## 2018-10-16 LAB — RETICULOCYTES
Immature Retic Fract: 27.5 % — ABNORMAL HIGH (ref 2.3–15.9)
RBC.: 3.05 MIL/uL — ABNORMAL LOW (ref 3.87–5.11)
Retic Count, Absolute: 71.1 10*3/uL (ref 19.0–186.0)
Retic Ct Pct: 2.3 % (ref 0.4–3.1)

## 2018-10-16 LAB — CBC
HCT: 26.1 % — ABNORMAL LOW (ref 36.0–46.0)
Hemoglobin: 8.6 g/dL — ABNORMAL LOW (ref 12.0–15.0)
MCH: 28.2 pg (ref 26.0–34.0)
MCHC: 33 g/dL (ref 30.0–36.0)
MCV: 85.6 fL (ref 80.0–100.0)
Platelets: 311 10*3/uL (ref 150–400)
RBC: 3.05 MIL/uL — ABNORMAL LOW (ref 3.87–5.11)
RDW: 15.6 % — ABNORMAL HIGH (ref 11.5–15.5)
WBC: 14.9 10*3/uL — ABNORMAL HIGH (ref 4.0–10.5)
nRBC: 0 % (ref 0.0–0.2)

## 2018-10-16 LAB — PROTEIN ELECTRO, RANDOM URINE
Albumin ELP, Urine: 16.3 %
Alpha-1-Globulin, U: 4.4 %
Alpha-2-Globulin, U: 21.4 %
Beta Globulin, U: 43.4 %
Gamma Globulin, U: 14.5 %
Total Protein, Urine: 201 mg/dL

## 2018-10-16 LAB — RENAL FUNCTION PANEL
Albumin: 1.4 g/dL — ABNORMAL LOW (ref 3.5–5.0)
Anion gap: 11 (ref 5–15)
BUN: 78 mg/dL — ABNORMAL HIGH (ref 8–23)
CO2: 14 mmol/L — ABNORMAL LOW (ref 22–32)
Calcium: 7.4 mg/dL — ABNORMAL LOW (ref 8.9–10.3)
Chloride: 107 mmol/L (ref 98–111)
Creatinine, Ser: 4.29 mg/dL — ABNORMAL HIGH (ref 0.44–1.00)
GFR calc Af Amer: 12 mL/min — ABNORMAL LOW (ref >60)
GFR calc non Af Amer: 10 mL/min — ABNORMAL LOW (ref >60)
Glucose, Bld: 138 mg/dL — ABNORMAL HIGH (ref 70–99)
Phosphorus: 5.3 mg/dL — ABNORMAL HIGH (ref 2.5–4.6)
Potassium: 4.7 mmol/L (ref 3.5–5.1)
Sodium: 132 mmol/L — ABNORMAL LOW (ref 135–145)

## 2018-10-16 LAB — IRON AND TIBC
Iron: 22 ug/dL — ABNORMAL LOW (ref 28–170)
Saturation Ratios: 11 % (ref 10.4–31.8)
TIBC: 204 ug/dL — ABNORMAL LOW (ref 250–450)
UIBC: 182 ug/dL

## 2018-10-16 LAB — GLUCOSE, CAPILLARY
Glucose-Capillary: 144 mg/dL — ABNORMAL HIGH (ref 70–99)
Glucose-Capillary: 133 mg/dL — ABNORMAL HIGH (ref 70–99)
Glucose-Capillary: 138 mg/dL — ABNORMAL HIGH (ref 70–99)
Glucose-Capillary: 183 mg/dL — ABNORMAL HIGH (ref 70–99)

## 2018-10-16 LAB — FOLATE: Folate: 15.4 ng/mL (ref >5.9)

## 2018-10-16 LAB — MAGNESIUM: Magnesium: 2 mg/dL (ref 1.7–2.4)

## 2018-10-16 LAB — FERRITIN: Ferritin: 523 ng/mL — ABNORMAL HIGH (ref 11–307)

## 2018-10-16 LAB — MISC LABCORP TEST (SEND OUT): LabCorp test name: 354928

## 2018-10-16 LAB — VITAMIN B12: Vitamin B-12: 1429 pg/mL — ABNORMAL HIGH (ref 180–914)

## 2018-10-16 MED ORDER — STERILE WATER FOR INJECTION IV SOLN
INTRAVENOUS | Status: DC
  Administered 2018-10-17: 08:00:00 via INTRAVENOUS
  Filled 2018-10-16 (×4): qty 850

## 2018-10-16 MED ORDER — DARBEPOETIN ALFA 40 MCG/0.4ML IJ SOSY
40.0000 ug | PREFILLED_SYRINGE | Freq: Once | INTRAMUSCULAR | Status: AC
  Administered 2018-10-16: 40 ug via SUBCUTANEOUS
  Filled 2018-10-16: qty 0.4

## 2018-10-16 MED ORDER — FUROSEMIDE 10 MG/ML IJ SOLN
60.0000 mg | Freq: Once | INTRAMUSCULAR | Status: AC
  Administered 2018-10-16: 60 mg via INTRAVENOUS
  Filled 2018-10-16: qty 6

## 2018-10-16 MED ORDER — SODIUM BICARBONATE 650 MG PO TABS
1300.0000 mg | ORAL_TABLET | Freq: Two times a day (BID) | ORAL | Status: DC
  Administered 2018-10-16 (×2): 1300 mg via ORAL
  Filled 2018-10-16 (×3): qty 2

## 2018-10-16 NOTE — Progress Notes (Addendum)
[facility]    NEPHROLOGY PROGRESS NOTE  SUBJECTIVE: Feels ok today.  Spoke with pt and Allison Patel daughter via speakerphone.  Discussed worsening renal function.  She lost Allison Patel IV access today and they are working to get another.    Review of systems:   No shortness of breath or chest pain Denies n/v No rash  Appetite ok    OBJECTIVE:  Vitals:   10/16/18 0700 10/16/18 1053  BP: (!) 167/72 (!) 134/51  Pulse: 97 89  Resp:  (!) 28  Temp: 97.9 F (36.6 C) 98.4 F (36.9 C)  SpO2:  98%    Intake/Output Summary (Last 24 hours) at 10/16/2018 1356 Last data filed at 10/16/2018 1054 Gross per 24 hour  Intake 2971.84 ml  Output 950 ml  Net 2021.84 ml      General:  AAOx3 NAD HEENT: MMM [place] AT anicteric sclera Neck:  No JVD CV:  RRR ; no rub  Lungs clear and normal work of breathing  Abd:  abd SNT/ND Extremities: trace to 1+ edema lower extremities  Skin:  No skin rash GU - foley in place   MEDICATIONS:  . amLODipine  10 mg Oral Daily  . atorvastatin  40 mg Oral q1800  . ciprofloxacin  500 mg Oral Daily  . cloNIDine  0.2 mg Oral BID  . ferrous sulfate  325 mg Oral Q breakfast  . insulin aspart  0-9 Units Subcutaneous TID WC  . insulin aspart  5 Units Subcutaneous TID WC  . insulin detemir  10 Units Subcutaneous BID  . metoprolol tartrate  12.5 mg Oral BID  . polyethylene glycol  17 g Oral Daily       LABS:   CBC Latest Ref Rng & Units 10/16/2018 10/15/2018 10/14/2018  WBC 4.0 - 10.5 K/uL 14.9(H) 14.7(H) 13.0(H)  Hemoglobin 12.0 - 15.0 g/dL 8.6(L) 8.7(L) 8.4(L)  Hematocrit 36.0 - 46.0 % 26.1(L) 26.2(L) 25.2(L)  Platelets 150 - 400 K/uL 311 276 245    CMP Latest Ref Rng & Units 10/16/2018 10/15/2018 10/14/2018  Glucose 70 - 99 mg/dL 138(H) 149(H) 182(H)  BUN 8 - 23 mg/dL 78(H) 78(H) 78(H)  Creatinine 0.44 - 1.00 mg/dL 4.29(H) 4.18(H) 4.12(H)  Sodium 135 - 145 mmol/L 132(L) 131(L) 130(L)  Potassium 3.5 - 5.1 mmol/L 4.7 4.4 4.2  Chloride 98 - 111 mmol/L 107  105 102  CO2 22 - 32 mmol/L 14(L) 17(L) 17(L)  Calcium 8.9 - 10.3 mg/dL 7.4(L) 7.5(L) 7.3(L)  Total Protein 6.5 - 8.1 g/dL - - 5.1(L)  Total Bilirubin 0.3 - 1.2 mg/dL - - 1.2  Alkaline Phos 38 - 126 U/L - - 233(H)  AST 15 - 41 U/L - - 17  ALT 0 - 44 U/L - - 20    Lab Results  Component Value Date   CALCIUM 7.4 (L) 10/16/2018   CAION 0.94 (L) 10/06/2018   PHOS 5.3 (H) 10/16/2018       Component Value Date/Time   COLORURINE YELLOW 10/12/2018 1115   APPEARANCEUR CLOUDY (A) 10/12/2018 1115   LABSPEC 1.010 10/12/2018 1115   PHURINE 5.0 10/12/2018 1115   GLUCOSEU 150 (A) 10/12/2018 1115   HGBUR MODERATE (A) 10/12/2018 1115   BILIRUBINUR NEGATIVE 10/12/2018 1115   BILIRUBINUR NEG 07/03/2017 1527   KETONESUR NEGATIVE 10/12/2018 1115   PROTEINUR 100 (A) 10/12/2018 1115   UROBILINOGEN 0.2 07/03/2017 1527   NITRITE NEGATIVE 10/12/2018 1115   LEUKOCYTESUR LARGE (A) 10/12/2018 1115      Component Value Date/Time  HCO3 17.4 (L) 10/06/2018 1115   TCO2 18 (L) 10/06/2018 1115   ACIDBASEDEF 8.0 (H) 10/06/2018 1115   O2SAT 65.0 10/06/2018 1115       Component Value Date/Time   IRON 22 (L) 10/16/2018 0317   TIBC 204 (L) 10/16/2018 0317   FERRITIN 523 (H) 10/16/2018 0317   IRONPCTSAT 11 10/16/2018 0317       ASSESSMENT/PLAN:    63 year old female patient with past medical history of diabetes, hypertension, and hypothyroidism presented for sepsis secondary to pyelonephritis.    1.  Acute kidney injury Previously felt secondary to pre-renal and ischemic insults in the setting of sepsis and pyelo.  Hypotension at home and on ARB-HCTZ prior to admit in setting of AKI.  Limited reserve with uncontrolled diabetes at baseline at HbA1c over 14.  With worsening inpatient cannot r/o AIN and have changed antibiotics 2/2 same though urine eos neg and serum eos ok.  Received short duration of vanc - now off.  Presented with Cr 2.89 and Note baseline serum creatinine 0.85 from 03/16/2018.  Renal  ultrasound revealed no evidence of obstruction.  There is a stone in the mid to lower left kidney.  - Continue foley catheter for retention and AKI  - Starting bicarb for acidosis - PO bicarb.  Allison Patel weight is actually up quite a bit and she has developed swelling.  Lasix once today  - Nonoliguric.  No acute indication for dialysis; monitoring for needs and patient would want dialysis if she develops a need.  Following closely    - She has transitioned off of cephalosporin to cipro in the event this is contributing - GN work-up as below - NPO after midnight in the event intervention is needed with worsening renal function   2. Proteinuria  - 3240 mg/g up/cr ratio.  Feel secondary to diabetes with hemoglobin A1c over 14 - Spep no M spike and free light chains ratio normal.  Random UPEP ordered   3. Hematuria - Setting of infection as well as stone and improving on repeat.  Note hematuria on 06/2017 study as well.  Complement normal.  With pyelo not ideal for renal biopsy  - ANA, ANCA negative  4. Metabolic acidosis  - Setting of AKI  - Start bicarb - For now, PO bicarbonate while awaiting access then bicarb gtt   5.  Pyelonephritis. Abx per primary team   6. Bacteremia - enterobacter.  Repeat blood cultures from 6/24 are in process and NGTD.  on abx per primary team.  Primary team has obtained a TTE.  If clinical concern for endocarditis TTE report recommends a TEE   7.  Diabetes mellitus.  Per primary team   8.  Anemia - microcytic with iron deficiency.  Defer IV iron with bacteremia.  On oral iron.  Stable.  No acute indication for PRBC's.  Provide aranesp 40 mcg once on 6/26  9.  Thrombocytopenia.  Resolved.  Felt likely related to sepsis.    10.  Hypertension.  Continue current regimen for now.  Avoid hypotension.    Allison Patel 10/16/2018 1:56 PM

## 2018-10-16 NOTE — Progress Notes (Signed)
Inpatient Rehab Admissions Coordinator:    Note pt bordering on supervision for mobility, distances > or equal to 100'.  Have spoken with husband and he is able to provide at least 2 weeks of supervision to pt at discharge from hospital. Therapy recommending home health.  Will sign off and let CM know.   Shann Medal, PT, DPT Admissions Coordinator [phone] 10/16/18  9:48 AM

## 2018-10-16 NOTE — Progress Notes (Signed)
PROGRESS NOTE    Allison Patel  NTZ:[id number] DOB: 1955/11/09 DOA: 10/06/2018 PCP: Elby Showers, MD   Brief Narrative:  63 year old WF PMHX DM type II uncontrolled with complication,, HTN, murmur, prior gluteal abscess, HLD, hypothyroidism, morbid obesity,  Presenting with confusion since Sunday ~3 days. Initially got better with PO fluids but got worse last night into day of admission. Admission labs notable for +UA, actidosis, acute kidney injury, and lactic acidemia. Lactate and mental status did not improve with fluids and abx in ER so intensivist asked to admit.     Subjective: 6/26 A/O x4, negative S OB, negative abdominal pain, negative CP, negative N/V.  Sitting in chair comfortably   Assessment & Plan:   Principal Problem:   Severe sepsis ([place]) Active Problems:   Benign essential HTN   Hypercholesterolemia without hypertriglyceridemia   Adult hypothyroidism   Diabetes mellitus type 2, uncontrolled (HCC)   HLD (hyperlipidemia)  Severe sepsis/MODS -Multifactorial bacteremia, acute pyelonephritis.  Treat underlying cause,  Bacteremia positive ENTEROBACTER CLOACAE -Antibiotic changed to ciprofloxacin secondary to continued renal failure.  After discussion with pharmacy was determined that antibiotic would be good for bacteremia as well as acute pyelonephritis. - Complete 14-day course of antibiotics  Acute respiratory failure with hypoxia -Resolved, now on room air  Essential HTN -Amlodipine 10 mg daily -Clonidine 0.2 mg twice daily - Hydralazine PRN -6/24 metoprolol 12.5 mg twice daily  Cardiac murmur - Echocardiogram hyperdynamic EF > 65 see results below.    Acute pyelonephritis positive ENTEROBACTER CLOACAE -See bacteremia -Discussed case with nephrology renal ultrasound revealed no evidence of obstruction. -ANCA <3.5 -ANA negative -SPEP: Negative evidence of monoclonal protein.  Negative M spike -Free light chains pending  Kappa free light chains  elevated 178.9  Lambda free light chain elevated 129.9  Kappa lambda light WNL - Continue with Foley - Strict in and out +8.1 L -Per nephrology currently no indication for acute dialysis  AKI/proteinuria/hematuria -Secondary to pyelonephritis, uncontrolled diabetes and hypertension may also have played a part.  In addition awaiting results of labs.. Recent Labs  Lab 10/12/18 0231 10/13/18 0300 10/14/18 0212 10/15/18 0316 10/16/18 0317  CREATININE 3.55* 4.04* 4.12* 4.18* 4.29*   Diabetes type 2 uncontrolled with complication - 6/75 hemoglobin A1c= 14 - 6/24 increase Levemir 10 units twice daily - 6/24 start NovoLog 5 units q. AC - Sensitive SSI  HLD - LDL not within ADA/AHA guidelines - LDL goal<70 - 6/25 start Lipitor 40 mg daily  Anemia of chronic disease Recent Labs  Lab 10/12/18 0231 10/13/18 0300 10/14/18 0212 10/15/18 0316 10/16/18 0429  HGB 9.0* 8.7* 8.4* 8.7* 8.6*  -Stable -Anemia panel most consistent with anemia of chronic disease -Occult blood pending - Negative findings of acute bleeding.  Thrombocytopenia -Resolved  Anxiety -Currently not on Xanax while in hospital and and doing well.  Would not restart, would start SSRI if patient requires long-term medication.  If patient requires short-term medication would start diazepam or Ativan.  Morbid obesity - Diabetic nutritionist has seen patient -Body mass index is 35.16 kg/m.    DVT prophylaxis: SCD Code Status: Full Family Communication: None Disposition Plan: TBD   Consultants:  Nephrology PCCM  Procedures/Significant Events:  6/24 echocardiogram:Left Ventricle:- hyperdynamic systolic function.  LVEF>65%. -asymmetric left ventricular hypertrophy.  -Left ventricular diastolic Doppler parameters are consistent with pseudonormalization.      I have personally reviewed and interpreted all radiology studies and my findings are as above.  VENTILATOR SETTINGS:  Cultures 6/16 SARS  coronavirus negative 6/16 blood left antecubital positive ENTEROBACTER CLOACAE 6/16 blood left wrist positive ENTEROBACTER CLOACAE 6/16 urine positive ENTEROBACTER CLOACAE 6/17 HIV negative 6/24 blood pending    Antimicrobials: Anti-infectives (From admission, onward)   Start     Stop   10/15/18 1000  ciprofloxacin (CIPRO) tablet 500 mg     10/20/18 0959   10/13/18 1230  ceFEPIme (MAXIPIME) 1 g in sodium chloride 0.9 % 100 mL IVPB  Status:  Discontinued     10/14/18 1307   10/09/18 0930  ceFEPIme (MAXIPIME) 2 g in sodium chloride 0.9 % 100 mL IVPB  Status:  Discontinued     10/12/18 1106   10/09/18 0745  cefTRIAXone (ROCEPHIN) 2 g in sodium chloride 0.9 % 100 mL IVPB  Status:  Discontinued     10/09/18 0744   10/07/18 1000  ceFEPIme (MAXIPIME) 2 g in sodium chloride 0.9 % 100 mL IVPB  Status:  Discontinued     10/06/18 1330   10/07/18 0900  ceFEPIme (MAXIPIME) 2 g in sodium chloride 0.9 % 100 mL IVPB  Status:  Discontinued     10/09/18 0739   10/07/18 0800  cefTRIAXone (ROCEPHIN) 2 g in sodium chloride 0.9 % 100 mL IVPB  Status:  Discontinued     10/07/18 0326   10/06/18 1345  cefTRIAXone (ROCEPHIN) 2 g in sodium chloride 0.9 % 100 mL IVPB  Status:  Discontinued     10/06/18 1509   10/06/18 1051  vancomycin variable dose per unstable renal function (pharmacist dosing)  Status:  Discontinued     10/06/18 1334   10/06/18 0900  ceFEPIme (MAXIPIME) 2 g in sodium chloride 0.9 % 100 mL IVPB     10/06/18 0954   10/06/18 0900  metroNIDAZOLE (FLAGYL) IVPB 500 mg     10/06/18 1028   10/06/18 0900  vancomycin (VANCOCIN) IVPB 1000 mg/200 mL premix  Status:  Discontinued     10/06/18 0849   10/06/18 0900  vancomycin (VANCOCIN) 1,500 mg in sodium chloride 0.9 % 500 mL IVPB     10/06/18 1233      Devices    LINES / TUBES:      Continuous Infusions: . sodium chloride 75 mL/hr at 10/14/18 1700     Objective: Vitals:   10/16/18 0004 10/16/18 0345 10/16/18 0554 10/16/18 0700   BP: 133/71 (!) 144/69  (!) 167/72  Pulse:    97  Resp: (!) 29 (!) 31    Temp: 98.2 F (36.8 C) 97.9 F (36.6 C)  97.9 F (36.6 C)  TempSrc: Oral Oral  Oral  SpO2:      Weight:   84.2 kg   Height:        Intake/Output Summary (Last 24 hours) at 10/16/2018 1009 Last data filed at 10/16/2018 0759 Gross per 24 hour  Intake 2971.84 ml  Output 850 ml  Net 2121.84 ml   Filed Weights   10/14/18 0500 10/15/18 0457 10/16/18 0554  Weight: 80.6 kg 82.3 kg 84.2 kg   Physical Exam:  General: A/O x4, no acute respiratory distress Eyes: negative scleral hemorrhage, negative anisocoria, negative icterus ENT: Negative Runny nose, negative gingival bleeding, Neck:  Negative scars, masses, torticollis, lymphadenopathy, JVD Lungs: Clear to auscultation bilaterally without wheezes or crackles Cardiovascular: Regular rate and rhythm without murmur gallop or rub normal S1 and S2 Abdomen: Morbidly obese negative abdominal pain, nondistended, positive soft, bowel sounds, no rebound, no ascites, no appreciable mass Extremities: No significant cyanosis, clubbing,  or edema bilateral lower extremities Skin: Negative rashes, lesions, ulcers Psychiatric:  Negative depression, negative anxiety, negative fatigue, negative mania  Central nervous system:  Cranial nerves II through XII intact, tongue/uvula midline, all extremities muscle strength 5/5, sensation intact throughout, negative dysarthria, negative expressive aphasia, negative receptive aphasia.    Data Reviewed: Care during the described time interval was provided by me .  I have reviewed this patient's available data, including medical history, events of note, physical examination, and all test results as part of my evaluation.   CBC: Recent Labs  Lab 10/12/18 0231 10/13/18 0300 10/14/18 0212 10/15/18 0316 10/16/18 0429  WBC 11.7* 12.4* 13.0* 14.7* 14.9*  NEUTROABS  --   --  10.2*  --   --   HGB 9.0* 8.7* 8.4* 8.7* 8.6*  HCT 26.5* 25.8*  25.2* 26.2* 26.1*  MCV 81.8 81.9 83.4 84.5 85.6  PLT 168 211 245 276 413   Basic Metabolic Panel: Recent Labs  Lab 10/12/18 0231 10/13/18 0300 10/14/18 0212 10/15/18 0316 10/16/18 0317  NA 128* 128* 130* 131* 132*  K 3.8 4.3 4.2 4.4 4.7  CL 98 99 102 105 107  CO2 20* 20* 17* 17* 14*  GLUCOSE 244* 197* 182* 149* 138*  BUN 73* 77* 78* 78* 78*  CREATININE 3.55* 4.04* 4.12* 4.18* 4.29*  CALCIUM 7.4* 7.5* 7.3* 7.5* 7.4*  MG  --   --   --  2.0 2.0  PHOS 2.4* 3.4 4.1 4.9* 5.3*   GFR: Estimated Creatinine Clearance: 12.9 mL/min (A) (by C-G formula based on SCr of 4.29 mg/dL (H)). Liver Function Tests: Recent Labs  Lab 10/10/18 0306  10/12/18 0231 10/13/18 0300 10/14/18 0212 10/15/18 0316 10/16/18 0317  AST 46*  --  21  --  17  --   --   ALT 51*  --  29  --  20  --   --   ALKPHOS 270*  --  239*  --  233*  --   --   BILITOT 2.9*  --  1.7*  --  1.2  --   --   PROT 5.1*  --  5.0*  --  5.1*  --   --   ALBUMIN 1.5*  1.4*   < > 1.4*  1.4* 1.3* 1.4*  1.4* 1.4* 1.4*   < > = values in this interval not displayed.   No results for input(s): LIPASE, AMYLASE in the last 168 hours. No results for input(s): AMMONIA in the last 168 hours. Coagulation Profile: No results for input(s): INR, PROTIME in the last 168 hours. Cardiac Enzymes: No results for input(s): CKTOTAL, CKMB, CKMBINDEX, TROPONINI in the last 168 hours. BNP (last 3 results) No results for input(s): PROBNP in the last 8760 hours. HbA1C: No results for input(s): HGBA1C in the last 72 hours. CBG: Recent Labs  Lab 10/15/18 0622 10/15/18 1048 10/15/18 1541 10/15/18 2134 10/16/18 0603  GLUCAP 140* 126* 100* 183* 144*   Lipid Profile: Recent Labs    10/15/18 0316  CHOL 203*  HDL 15*  LDLCALC 143*  TRIG 225*  CHOLHDL 13.5   Thyroid Function Tests: No results for input(s): TSH, T4TOTAL, FREET4, T3FREE, THYROIDAB in the last 72 hours. Anemia Panel: Recent Labs    10/16/18 0317 10/16/18 0429  VITAMINB12  1,429*  --   FOLATE 15.4  --   FERRITIN 523*  --   TIBC 204*  --   IRON 22*  --   RETICCTPCT  --  2.3   Urine  analysis:    Component Value Date/Time   COLORURINE YELLOW 10/12/2018 1115   APPEARANCEUR CLOUDY (A) 10/12/2018 1115   LABSPEC 1.010 10/12/2018 1115   PHURINE 5.0 10/12/2018 1115   GLUCOSEU 150 (A) 10/12/2018 1115   HGBUR MODERATE (A) 10/12/2018 1115   BILIRUBINUR NEGATIVE 10/12/2018 1115   BILIRUBINUR NEG 07/03/2017 1527   KETONESUR NEGATIVE 10/12/2018 1115   PROTEINUR 100 (A) 10/12/2018 1115   UROBILINOGEN 0.2 07/03/2017 1527   NITRITE NEGATIVE 10/12/2018 1115   LEUKOCYTESUR LARGE (A) 10/12/2018 1115   Sepsis Labs: @LABRCNTIP (procalcitonin:4,lacticidven:4)  ) Recent Results (from the past 240 hour(s))  Urine culture     Status: Abnormal   Collection Time: 10/06/18 10:10 AM   Specimen: Urine, Random  Result Value Ref Range Status   Specimen Description URINE, RANDOM  Final   Special Requests   Final    Immunocompromised Performed at [facility], [place] [address] [place], [place] [address]    Culture >=100,000 COLONIES/mL ENTEROBACTER CLOACAE (A)  Final   Report Status 10/10/2018 FINAL  Final   Organism ID, Bacteria ENTEROBACTER CLOACAE (A)  Final      Susceptibility   Enterobacter cloacae - MIC*    CEFAZOLIN >=64 RESISTANT Resistant     CEFTRIAXONE <=1 SENSITIVE Sensitive     CIPROFLOXACIN <=0.25 SENSITIVE Sensitive     GENTAMICIN <=1 SENSITIVE Sensitive     IMIPENEM 0.5 SENSITIVE Sensitive     NITROFURANTOIN INTERMEDIATE Intermediate     TRIMETH/SULFA <=20 SENSITIVE Sensitive     PIP/TAZO <=4 SENSITIVE Sensitive     * >=100,000 COLONIES/mL ENTEROBACTER CLOACAE  MRSA PCR Screening     Status: None   Collection Time: 10/06/18  6:11 PM   Specimen: Nasal Mucosa; Nasopharyngeal  Result Value Ref Range Status   MRSA by PCR NEGATIVE NEGATIVE Final    Comment:        The GeneXpert MRSA Assay (FDA approved for NASAL specimens only), is one component  of a comprehensive MRSA colonization surveillance program. It is not intended to diagnose MRSA infection nor to guide or monitor treatment for MRSA infections. Performed at [facility], [place] [address], [place], [place] [address]   Culture, blood (routine x 2)     Status: None (Preliminary result)   Collection Time: 10/14/18  9:00 AM   Specimen: BLOOD  Result Value Ref Range Status   Specimen Description BLOOD RIGHT ANTECUBITAL  Final   Special Requests   Final    BOTTLES DRAWN AEROBIC ONLY Blood Culture results may not be optimal due to an inadequate volume of blood received in culture bottles   Culture   Final    NO GROWTH 2 DAYS Performed at [facility], [place] [address] [place], [place] [address]    Report Status PENDING  Incomplete  Culture, blood (routine x 2)     Status: None (Preliminary result)   Collection Time: 10/14/18  9:08 AM   Specimen: BLOOD RIGHT HAND  Result Value Ref Range Status   Specimen Description BLOOD RIGHT HAND  Final   Special Requests   Final    BOTTLES DRAWN AEROBIC ONLY Blood Culture adequate volume   Culture   Final    NO GROWTH 2 DAYS Performed at [facility], [place] [address], [place], [place] [address]    Report Status PENDING  Incomplete         Radiology Studies: No results found.      Scheduled Meds: . amLODipine  10 mg Oral Daily  . atorvastatin  40 mg Oral q1800  . ciprofloxacin  500 mg Oral Daily  . cloNIDine  0.2 mg Oral BID  . ferrous sulfate  325 mg Oral Q breakfast  . insulin aspart  0-9 Units Subcutaneous TID WC  . insulin aspart  5 Units Subcutaneous TID WC  . insulin detemir  10 Units Subcutaneous BID  . metoprolol tartrate  12.5 mg Oral BID  . polyethylene glycol  17 g Oral Daily   Continuous Infusions: . sodium chloride 75 mL/hr at 10/14/18 1700     LOS: 10 days   The patient is critically ill with multiple organ systems failure and requires high complexity decision making for assessment  and support, frequent evaluation and titration of therapies, application of advanced monitoring technologies and extensive interpretation of multiple databases. Critical Care Time devoted to patient care services described in this note  Time spent: 40 minutes     Aamya Orellana, Geraldo Docker, MD [facility] Pager [phone]  If 7PM-7AM, please contact night-coverage [email or web address] Password [email or web address] 10/16/2018, 10:09 AM

## 2018-10-17 LAB — CBC
HCT: 28.2 % — ABNORMAL LOW (ref 36.0–46.0)
Hemoglobin: 9 g/dL — ABNORMAL LOW (ref 12.0–15.0)
MCH: 27.4 pg (ref 26.0–34.0)
MCHC: 31.9 g/dL (ref 30.0–36.0)
MCV: 86 fL (ref 80.0–100.0)
Platelets: 350 10*3/uL (ref 150–400)
RBC: 3.28 MIL/uL — ABNORMAL LOW (ref 3.87–5.11)
RDW: 15.4 % (ref 11.5–15.5)
WBC: 13.8 10*3/uL — ABNORMAL HIGH (ref 4.0–10.5)
nRBC: 0 % (ref 0.0–0.2)

## 2018-10-17 LAB — GLUCOSE, CAPILLARY
Glucose-Capillary: 154 mg/dL — ABNORMAL HIGH (ref 70–99)
Glucose-Capillary: 134 mg/dL — ABNORMAL HIGH (ref 70–99)
Glucose-Capillary: 162 mg/dL — ABNORMAL HIGH (ref 70–99)
Glucose-Capillary: 224 mg/dL — ABNORMAL HIGH (ref 70–99)

## 2018-10-17 LAB — BASIC METABOLIC PANEL
Anion gap: 15 (ref 5–15)
BUN: 75 mg/dL — ABNORMAL HIGH (ref 8–23)
CO2: 18 mmol/L — ABNORMAL LOW (ref 22–32)
Calcium: 7.7 mg/dL — ABNORMAL LOW (ref 8.9–10.3)
Chloride: 100 mmol/L (ref 98–111)
Creatinine, Ser: 4.63 mg/dL — ABNORMAL HIGH (ref 0.44–1.00)
GFR calc Af Amer: 11 mL/min — ABNORMAL LOW (ref >60)
GFR calc non Af Amer: 9 mL/min — ABNORMAL LOW (ref >60)
Glucose, Bld: 148 mg/dL — ABNORMAL HIGH (ref 70–99)
Potassium: 4.7 mmol/L (ref 3.5–5.1)
Sodium: 133 mmol/L — ABNORMAL LOW (ref 135–145)

## 2018-10-17 LAB — MAGNESIUM: Magnesium: 2 mg/dL (ref 1.7–2.4)

## 2018-10-17 MED ORDER — SODIUM BICARBONATE 650 MG PO TABS
1300.0000 mg | ORAL_TABLET | Freq: Two times a day (BID) | ORAL | Status: DC
  Administered 2018-10-17 – 2018-10-21 (×8): 1300 mg via ORAL
  Filled 2018-10-17 (×9): qty 2

## 2018-10-17 NOTE — Plan of Care (Signed)

## 2018-10-17 NOTE — Progress Notes (Addendum)
PROGRESS NOTE    Allison Patel  NWG:[id number] DOB: 1955/05/16 DOA: 10/06/2018 PCP: Elby Showers, MD   Brief Narrative:  63 year old WF PMHX DM type II uncontrolled with complication,, HTN, murmur, prior gluteal abscess, HLD, hypothyroidism, morbid obesity,  Presenting with confusion since Sunday ~3 days. Initially got better with PO fluids but got worse last night into day of admission. Admission labs notable for +UA, actidosis, acute kidney injury, and lactic acidemia. Lactate and mental status did not improve with fluids and abx in ER so intensivist asked to admit.     Subjective: 6/27 A/O x4, negative S OB, negative abdominal pain, negative CP, negative N/V.  Sitting on the edge of her bed comfortably.   Assessment & Plan:   Principal Problem:   Severe sepsis ([place]) Active Problems:   Benign essential HTN   Hypercholesterolemia without hypertriglyceridemia   Adult hypothyroidism   Diabetes mellitus type 2, uncontrolled (HCC)   HLD (hyperlipidemia)  Severe sepsis/MODS -Multifactorial bacteremia, acute pyelonephritis.  Treat underlying cause,  Bacteremia positive ENTEROBACTER CLOACAE -Antibiotic changed to ciprofloxacin secondary to continued renal failure.  After discussion with pharmacy was determined that antibiotic would be good for bacteremia as well as acute pyelonephritis. - Complete 14-day course antibiotics; tentative end date 6/30  Acute respiratory failure with hypoxia -Resolved, now on room air  Essential HTN -Amlodipine 10 mg daily -Clonidine 0.2 mg twice daily - Hydralazine PRN -6/24 metoprolol 12.5 mg twice daily  Cardiac murmur - Echocardiogram hyperdynamic EF > 65 see results below.    Acute pyelonephritis positive ENTEROBACTER CLOACAE -See bacteremia -6/27 discussed case with nephrology Dr. Harrie Jeans and agreed that risks outweighed the gains of a kidney biopsy therefore will not obtain one.  Will begin HD on Monday.  Spoke with patient's  daughter and explained mothers current options. -ANCA <3.5 -ANA negative -SPEP: Negative evidence of monoclonal protein.  Negative M spike -Free light chains pending  Kappa free light chains elevated 178.9  Lambda free light chain elevated 129.9  Kappa lambda light WNL - Continue with Foley - Strict in and out +7.1 L  AKI/proteinuria/hematuria -Secondary to pyelonephritis, uncontrolled diabetes and hypertension may also have played a part.  In addition awaiting results of labs. - 6/26 Lasix 60 mg x 1 given per nephrology. Recent Labs  Lab 10/12/18 0231 10/13/18 0300 10/14/18 0212 10/15/18 0316 10/16/18 0317  CREATININE 3.55* 4.04* 4.12* 4.18* 4.29*   Diabetes type 2 uncontrolled with complication - 5/78 hemoglobin A1c= 14 - 6/24 increase Levemir 10 units twice daily - 6/24 start NovoLog 5 units q. AC - Sensitive SSI  HLD - LDL not within ADA/AHA guidelines - LDL goal<70 - 6/25 start Lipitor 40 mg daily  Anemia of chronic disease Recent Labs  Lab 10/12/18 0231 10/13/18 0300 10/14/18 0212 10/15/18 0316 10/16/18 0429  HGB 9.0* 8.7* 8.4* 8.7* 8.6*  -Stable -Anemia panel most consistent with anemia of chronic disease -Occult blood pending - Negative findings of acute bleeding.  Thrombocytopenia -Resolved  Anxiety -Currently not on Xanax while in hospital and and doing well.  Would not restart, would start SSRI if patient requires long-term medication.  If patient requires short-term medication would start diazepam or Ativan.  Morbid obesity - Diabetic nutritionist has seen patient -Body mass index is 35.16 kg/m.    DVT prophylaxis: SCD Code Status: Full Family Communication: None Disposition Plan: TBD   Consultants:  Nephrology PCCM  Procedures/Significant Events:  6/24 echocardiogram:Left Ventricle:- hyperdynamic systolic function.  LVEF>65%. -asymmetric left  ventricular hypertrophy.  -Left ventricular diastolic Doppler parameters are consistent  with pseudonormalization.      I have personally reviewed and interpreted all radiology studies and my findings are as above.  VENTILATOR SETTINGS:    Cultures 6/16 SARS coronavirus negative 6/16 blood left antecubital positive ENTEROBACTER CLOACAE 6/16 blood left wrist positive ENTEROBACTER CLOACAE 6/16 urine positive ENTEROBACTER CLOACAE 6/17 HIV negative 6/24 blood pending    Antimicrobials: Anti-infectives (From admission, onward)   Start     Stop   10/15/18 1000  ciprofloxacin (CIPRO) tablet 500 mg     10/20/18 0959   10/13/18 1230  ceFEPIme (MAXIPIME) 1 g in sodium chloride 0.9 % 100 mL IVPB  Status:  Discontinued     10/14/18 1307   10/09/18 0930  ceFEPIme (MAXIPIME) 2 g in sodium chloride 0.9 % 100 mL IVPB  Status:  Discontinued     10/12/18 1106   10/09/18 0745  cefTRIAXone (ROCEPHIN) 2 g in sodium chloride 0.9 % 100 mL IVPB  Status:  Discontinued     10/09/18 0744   10/07/18 1000  ceFEPIme (MAXIPIME) 2 g in sodium chloride 0.9 % 100 mL IVPB  Status:  Discontinued     10/06/18 1330   10/07/18 0900  ceFEPIme (MAXIPIME) 2 g in sodium chloride 0.9 % 100 mL IVPB  Status:  Discontinued     10/09/18 0739   10/07/18 0800  cefTRIAXone (ROCEPHIN) 2 g in sodium chloride 0.9 % 100 mL IVPB  Status:  Discontinued     10/07/18 0326   10/06/18 1345  cefTRIAXone (ROCEPHIN) 2 g in sodium chloride 0.9 % 100 mL IVPB  Status:  Discontinued     10/06/18 1509   10/06/18 1051  vancomycin variable dose per unstable renal function (pharmacist dosing)  Status:  Discontinued     10/06/18 1334   10/06/18 0900  ceFEPIme (MAXIPIME) 2 g in sodium chloride 0.9 % 100 mL IVPB     10/06/18 0954   10/06/18 0900  metroNIDAZOLE (FLAGYL) IVPB 500 mg     10/06/18 1028   10/06/18 0900  vancomycin (VANCOCIN) IVPB 1000 mg/200 mL premix  Status:  Discontinued     10/06/18 0849   10/06/18 0900  vancomycin (VANCOCIN) 1,500 mg in sodium chloride 0.9 % 500 mL IVPB     10/06/18 1233      Devices     LINES / TUBES:      Continuous Infusions: .  sodium bicarbonate (isotonic) infusion in sterile water       Objective: Vitals:   10/16/18 2100 10/16/18 2318 10/17/18 0414 10/17/18 0500  BP: (!) 142/66 (!) 125/59 132/61   Pulse:  88 89   Resp: (!) 31 (!) 30 (!) 29   Temp:  97.7 F (36.5 C) (!) 97.5 F (36.4 C)   TempSrc:  Oral Oral   SpO2:  98% 100%   Weight:    85 kg  Height:        Intake/Output Summary (Last 24 hours) at 10/17/2018 0729 Last data filed at 10/17/2018 0300 Gross per 24 hour  Intake 480 ml  Output 1600 ml  Net -1120 ml   Filed Weights   10/15/18 0457 10/16/18 0554 10/17/18 0500  Weight: 82.3 kg 84.2 kg 85 kg   Physical Exam:  General: A/O x4,no acute respiratory distress Eyes: negative scleral hemorrhage, negative anisocoria, negative icterus ENT: Negative Runny nose, negative gingival bleeding, Neck:  Negative scars, masses, torticollis, lymphadenopathy, JVD Lungs: Clear to auscultation bilaterally without wheezes  or crackles Cardiovascular: Regular rate and rhythm without murmur gallop or rub normal S1 and S2 Abdomen: Morbidly obese, negative abdominal pain, nondistended, positive soft, bowel sounds, no rebound, no ascites, no appreciable mass Extremities: No significant cyanosis, clubbing, or edema bilateral lower extremities Skin: Negative rashes, lesions, ulcers Psychiatric:  Negative depression, negative anxiety, negative fatigue, negative mania  Central nervous system:  Cranial nerves II through XII intact, tongue/uvula midline, all extremities muscle strength 5/5, sensation intact throughout,  negative dysarthria, negative expressive aphasia, negative receptive aphasia.     Data Reviewed: Care during the described time interval was provided by me .  I have reviewed this patient's available data, including medical history, events of note, physical examination, and all test results as part of my evaluation.   CBC: Recent Labs  Lab 10/12/18  0231 10/13/18 0300 10/14/18 0212 10/15/18 0316 10/16/18 0429  WBC 11.7* 12.4* 13.0* 14.7* 14.9*  NEUTROABS  --   --  10.2*  --   --   HGB 9.0* 8.7* 8.4* 8.7* 8.6*  HCT 26.5* 25.8* 25.2* 26.2* 26.1*  MCV 81.8 81.9 83.4 84.5 85.6  PLT 168 211 245 276 161   Basic Metabolic Panel: Recent Labs  Lab 10/12/18 0231 10/13/18 0300 10/14/18 0212 10/15/18 0316 10/16/18 0317  NA 128* 128* 130* 131* 132*  K 3.8 4.3 4.2 4.4 4.7  CL 98 99 102 105 107  CO2 20* 20* 17* 17* 14*  GLUCOSE 244* 197* 182* 149* 138*  BUN 73* 77* 78* 78* 78*  CREATININE 3.55* 4.04* 4.12* 4.18* 4.29*  CALCIUM 7.4* 7.5* 7.3* 7.5* 7.4*  MG  --   --   --  2.0 2.0  PHOS 2.4* 3.4 4.1 4.9* 5.3*   GFR: Estimated Creatinine Clearance: 13 mL/min (A) (by C-G formula based on SCr of 4.29 mg/dL (H)). Liver Function Tests: Recent Labs  Lab 10/12/18 0231 10/13/18 0300 10/14/18 0212 10/15/18 0316 10/16/18 0317  AST 21  --  17  --   --   ALT 29  --  20  --   --   ALKPHOS 239*  --  233*  --   --   BILITOT 1.7*  --  1.2  --   --   PROT 5.0*  --  5.1*  --   --   ALBUMIN 1.4*  1.4* 1.3* 1.4*  1.4* 1.4* 1.4*   No results for input(s): LIPASE, AMYLASE in the last 168 hours. No results for input(s): AMMONIA in the last 168 hours. Coagulation Profile: No results for input(s): INR, PROTIME in the last 168 hours. Cardiac Enzymes: No results for input(s): CKTOTAL, CKMB, CKMBINDEX, TROPONINI in the last 168 hours. BNP (last 3 results) No results for input(s): PROBNP in the last 8760 hours. HbA1C: No results for input(s): HGBA1C in the last 72 hours. CBG: Recent Labs  Lab 10/16/18 0603 10/16/18 1118 10/16/18 1611 10/16/18 2108 10/17/18 0622  GLUCAP 144* 133* 138* 183* 154*   Lipid Profile: Recent Labs    10/15/18 0316  CHOL 203*  HDL 15*  LDLCALC 143*  TRIG 225*  CHOLHDL 13.5   Thyroid Function Tests: No results for input(s): TSH, T4TOTAL, FREET4, T3FREE, THYROIDAB in the last 72 hours. Anemia Panel:  Recent Labs    10/16/18 0317 10/16/18 0429  VITAMINB12 1,429*  --   FOLATE 15.4  --   FERRITIN 523*  --   TIBC 204*  --   IRON 22*  --   RETICCTPCT  --  2.3   Urine  analysis:    Component Value Date/Time   COLORURINE YELLOW 10/12/2018 1115   APPEARANCEUR CLOUDY (A) 10/12/2018 1115   LABSPEC 1.010 10/12/2018 1115   PHURINE 5.0 10/12/2018 1115   GLUCOSEU 150 (A) 10/12/2018 1115   HGBUR MODERATE (A) 10/12/2018 1115   BILIRUBINUR NEGATIVE 10/12/2018 1115   BILIRUBINUR NEG 07/03/2017 1527   KETONESUR NEGATIVE 10/12/2018 1115   PROTEINUR 100 (A) 10/12/2018 1115   UROBILINOGEN 0.2 07/03/2017 1527   NITRITE NEGATIVE 10/12/2018 1115   LEUKOCYTESUR LARGE (A) 10/12/2018 1115   Sepsis Labs: @LABRCNTIP (procalcitonin:4,lacticidven:4)  ) Recent Results (from the past 240 hour(s))  Culture, blood (routine x 2)     Status: None (Preliminary result)   Collection Time: 10/14/18  9:00 AM   Specimen: BLOOD  Result Value Ref Range Status   Specimen Description BLOOD RIGHT ANTECUBITAL  Final   Special Requests   Final    BOTTLES DRAWN AEROBIC ONLY Blood Culture results may not be optimal due to an inadequate volume of blood received in culture bottles   Culture   Final    NO GROWTH 2 DAYS Performed at [facility], [place] [address] [place], [place] [address]    Report Status PENDING  Incomplete  Culture, blood (routine x 2)     Status: None (Preliminary result)   Collection Time: 10/14/18  9:08 AM   Specimen: BLOOD RIGHT HAND  Result Value Ref Range Status   Specimen Description BLOOD RIGHT HAND  Final   Special Requests   Final    BOTTLES DRAWN AEROBIC ONLY Blood Culture adequate volume   Culture   Final    NO GROWTH 2 DAYS Performed at [facility], [place] [address], [place], [place] [address]    Report Status PENDING  Incomplete         Radiology Studies: No results found.      Scheduled Meds: . amLODipine  10 mg Oral Daily  . atorvastatin  40 mg Oral  q1800  . ciprofloxacin  500 mg Oral Daily  . cloNIDine  0.2 mg Oral BID  . ferrous sulfate  325 mg Oral Q breakfast  . insulin aspart  0-9 Units Subcutaneous TID WC  . insulin aspart  5 Units Subcutaneous TID WC  . insulin detemir  10 Units Subcutaneous BID  . metoprolol tartrate  12.5 mg Oral BID  . polyethylene glycol  17 g Oral Daily  . sodium bicarbonate  1,300 mg Oral BID   Continuous Infusions: .  sodium bicarbonate (isotonic) infusion in sterile water       LOS: 11 days   The patient is critically ill with multiple organ systems failure and requires high complexity decision making for assessment and support, frequent evaluation and titration of therapies, application of advanced monitoring technologies and extensive interpretation of multiple databases. Critical Care Time devoted to patient care services described in this note  Time spent: 40 minutes     WOODS, Geraldo Docker, MD [facility] Pager [phone]  If 7PM-7AM, please contact night-coverage [email or web address] Password [facility] 10/17/2018, 7:29 AM

## 2018-10-17 NOTE — Progress Notes (Signed)
[facility]    NEPHROLOGY PROGRESS NOTE  SUBJECTIVE: She had 1.6 liter UOP over 6/26.  Received lasix IV x 1.  She has been NPO in the event intervention is needed - still waiting on labs.  Feels ok this morning.  Got an IV yesterday and has been on the bicarb gtt.   Review of systems:  She denies shortness of breath or chest pain  Denies n/v No rash  Appetite ok    OBJECTIVE:  Vitals:   10/16/18 2318 10/17/18 0414  BP: (!) 125/59 132/61  Pulse: 88 89  Resp: (!) 30 (!) 29  Temp: 97.7 F (36.5 C) (!) 97.5 F (36.4 C)  SpO2: 98% 100%    Intake/Output Summary (Last 24 hours) at 10/17/2018 0739 Last data filed at 10/17/2018 0300 Gross per 24 hour  Intake 480 ml  Output 1600 ml  Net -1120 ml      General:  AAOx3 NAD  HEENT: MMM [place] AT anicteric sclera Neck:  No JVD CV:  RRR ; no rub  Lungs reduced with basilar crackles unlabored Abd:  abd SNT/ND Extremities: nonpitting lower extremity edema   Skin:  No skin rash GU - foley in place   MEDICATIONS:  . amLODipine  10 mg Oral Daily  . atorvastatin  40 mg Oral q1800  . ciprofloxacin  500 mg Oral Daily  . cloNIDine  0.2 mg Oral BID  . ferrous sulfate  325 mg Oral Q breakfast  . insulin aspart  0-9 Units Subcutaneous TID WC  . insulin aspart  5 Units Subcutaneous TID WC  . insulin detemir  10 Units Subcutaneous BID  . metoprolol tartrate  12.5 mg Oral BID  . polyethylene glycol  17 g Oral Daily  . sodium bicarbonate  1,300 mg Oral BID       LABS:   CBC Latest Ref Rng & Units 10/16/2018 10/15/2018 10/14/2018  WBC 4.0 - 10.5 K/uL 14.9(H) 14.7(H) 13.0(H)  Hemoglobin 12.0 - 15.0 g/dL 8.6(L) 8.7(L) 8.4(L)  Hematocrit 36.0 - 46.0 % 26.1(L) 26.2(L) 25.2(L)  Platelets 150 - 400 K/uL 311 276 245    CMP Latest Ref Rng & Units 10/16/2018 10/15/2018 10/14/2018  Glucose 70 - 99 mg/dL 138(H) 149(H) 182(H)  BUN 8 - 23 mg/dL 78(H) 78(H) 78(H)  Creatinine 0.44 - 1.00 mg/dL 4.29(H) 4.18(H) 4.12(H)  Sodium 135 - 145  mmol/L 132(L) 131(L) 130(L)  Potassium 3.5 - 5.1 mmol/L 4.7 4.4 4.2  Chloride 98 - 111 mmol/L 107 105 102  CO2 22 - 32 mmol/L 14(L) 17(L) 17(L)  Calcium 8.9 - 10.3 mg/dL 7.4(L) 7.5(L) 7.3(L)  Total Protein 6.5 - 8.1 g/dL - - 5.1(L)  Total Bilirubin 0.3 - 1.2 mg/dL - - 1.2  Alkaline Phos 38 - 126 U/L - - 233(H)  AST 15 - 41 U/L - - 17  ALT 0 - 44 U/L - - 20    Lab Results  Component Value Date   CALCIUM 7.4 (L) 10/16/2018   CAION 0.94 (L) 10/06/2018   PHOS 5.3 (H) 10/16/2018       Component Value Date/Time   COLORURINE YELLOW 10/12/2018 1115   APPEARANCEUR CLOUDY (A) 10/12/2018 1115   LABSPEC 1.010 10/12/2018 1115   PHURINE 5.0 10/12/2018 1115   GLUCOSEU 150 (A) 10/12/2018 1115   HGBUR MODERATE (A) 10/12/2018 1115   BILIRUBINUR NEGATIVE 10/12/2018 1115   BILIRUBINUR NEG 07/03/2017 1527   KETONESUR NEGATIVE 10/12/2018 1115   PROTEINUR 100 (A) 10/12/2018 1115   UROBILINOGEN 0.2  07/03/2017 1527   NITRITE NEGATIVE 10/12/2018 1115   LEUKOCYTESUR LARGE (A) 10/12/2018 1115      Component Value Date/Time   HCO3 17.4 (L) 10/06/2018 1115   TCO2 18 (L) 10/06/2018 1115   ACIDBASEDEF 8.0 (H) 10/06/2018 1115   O2SAT 65.0 10/06/2018 1115       Component Value Date/Time   IRON 22 (L) 10/16/2018 0317   TIBC 204 (L) 10/16/2018 0317   FERRITIN 523 (H) 10/16/2018 0317   IRONPCTSAT 11 10/16/2018 0317       ASSESSMENT/PLAN:    63 year old female patient with past medical history of diabetes, hypertension, and hypothyroidism presented for sepsis secondary to pyelonephritis.    1.  Acute kidney injury Previously felt secondary to pre-renal and ischemic insults in the setting of sepsis and pyelo.  Hypotension at home and on ARB-HCTZ prior to admit in setting of AKI.  Limited reserve with uncontrolled diabetes at baseline at HbA1c over 14.  With worsening inpatient cannot r/o AIN and have changed antibiotics 2/2 same though urine eos neg and serum eos ok.  Received short duration of vanc -  now off.  Presented with Cr 2.89 and Note baseline serum creatinine 0.85 from 03/16/2018.  Renal ultrasound revealed no evidence of obstruction.  There is a stone in the mid to lower left kidney.  - Continue foley catheter for retention and AKI  - Await today's labs  - She had been NPO in the event access was needed.  Continue NPO for now until receipt of labs  - She has transitioned off of cephalosporin to cipro in the event this is contributing.  Appears less likely culprit  - serologic GN work-up negative thus far as below  2. Proteinuria  - 3240 mg/g up/cr ratio.  Feel secondary to diabetes with hemoglobin A1c over 14 - Spep and UPEP with no M spike and free light chains ratio normal.  ANA, ANCA neg   3. Hematuria - Setting of infection as well as stone and improving on repeat.  Note hematuria on 06/2017 study as well.  Complement normal.  With pyelo not ideal for renal biopsy  - ANA, ANCA negative  4. Metabolic acidosis  - Setting of AKI  - Continue bicarb gtt.  S/p PO bicarb awaiting IV access.  - Await labs   5.  Pyelonephritis. Abx per primary team   6. Bacteremia - enterobacter.  Repeat blood cultures from 6/24 are in process and NGTD.  on abx per primary team.  Primary team has obtained a TTE.  If clinical concern for endocarditis TTE report recommends a TEE   7.  Diabetes mellitus.  Per primary team   8.  Anemia - microcytic with iron deficiency.  Defer IV iron with bacteremia.  On oral iron.  Stable.  No acute indication for PRBC's.  aranesp 40 mcg once on 6/26   9.  Thrombocytopenia.  Resolved.  Felt likely related to sepsis.    10.  Hypertension.  Acceptable on current regimen.  Avoid hypotension    Claudia Desanctis 10/17/2018 7:39 AM

## 2018-10-17 NOTE — Progress Notes (Signed)
Pt's Right hand where IV sodium is running is all swelled up, pt is saying it started yesterday, RN notified Dr Royce Macadamia that she will change IV sodium bicarb to oral for now, meanwhile IV team is consulted to have a second look in any other good place for IV, pt is not a candidate for midline according to MD. RN has secured the place of current IV, right hand is elevated in a pillow, will continue to monitor the patient  Palma Holter, RN

## 2018-10-18 LAB — OCCULT BLOOD X 1 CARD TO LAB, STOOL: Fecal Occult Bld: NEGATIVE

## 2018-10-18 LAB — GLUCOSE, CAPILLARY
Glucose-Capillary: 187 mg/dL — ABNORMAL HIGH (ref 70–99)
Glucose-Capillary: 149 mg/dL — ABNORMAL HIGH (ref 70–99)
Glucose-Capillary: 126 mg/dL — ABNORMAL HIGH (ref 70–99)

## 2018-10-18 LAB — BASIC METABOLIC PANEL
Anion gap: 9 (ref 5–15)
BUN: 75 mg/dL — ABNORMAL HIGH (ref 8–23)
CO2: 22 mmol/L (ref 22–32)
Calcium: 7.7 mg/dL — ABNORMAL LOW (ref 8.9–10.3)
Chloride: 102 mmol/L (ref 98–111)
Creatinine, Ser: 4.95 mg/dL — ABNORMAL HIGH (ref 0.44–1.00)
GFR calc Af Amer: 10 mL/min — ABNORMAL LOW (ref >60)
GFR calc non Af Amer: 9 mL/min — ABNORMAL LOW (ref >60)
Glucose, Bld: 174 mg/dL — ABNORMAL HIGH (ref 70–99)
Potassium: 4.7 mmol/L (ref 3.5–5.1)
Sodium: 133 mmol/L — ABNORMAL LOW (ref 135–145)

## 2018-10-18 LAB — RENAL FUNCTION PANEL
Albumin: 1.5 g/dL — ABNORMAL LOW (ref 3.5–5.0)
Anion gap: 13 (ref 5–15)
BUN: 78 mg/dL — ABNORMAL HIGH (ref 8–23)
CO2: 17 mmol/L — ABNORMAL LOW (ref 22–32)
Calcium: 7.4 mg/dL — ABNORMAL LOW (ref 8.9–10.3)
Chloride: 104 mmol/L (ref 98–111)
Creatinine, Ser: 4.94 mg/dL — ABNORMAL HIGH (ref 0.44–1.00)
GFR calc Af Amer: 10 mL/min — ABNORMAL LOW (ref >60)
GFR calc non Af Amer: 9 mL/min — ABNORMAL LOW (ref >60)
Glucose, Bld: 169 mg/dL — ABNORMAL HIGH (ref 70–99)
Phosphorus: 6.2 mg/dL — ABNORMAL HIGH (ref 2.5–4.6)
Potassium: 5.7 mmol/L — ABNORMAL HIGH (ref 3.5–5.1)
Sodium: 134 mmol/L — ABNORMAL LOW (ref 135–145)

## 2018-10-18 LAB — URINALYSIS, ROUTINE W REFLEX MICROSCOPIC
Bilirubin Urine: NEGATIVE
Glucose, UA: 50 mg/dL — AB
Ketones, ur: NEGATIVE mg/dL
Nitrite: NEGATIVE
Protein, ur: 100 mg/dL — AB
Specific Gravity, Urine: 1.009 (ref 1.005–1.030)
WBC, UA: >50 WBC/hpf — ABNORMAL HIGH (ref 0–5)
pH: 6 (ref 5.0–8.0)

## 2018-10-18 LAB — MAGNESIUM: Magnesium: 1.9 mg/dL (ref 1.7–2.4)

## 2018-10-18 LAB — CBC
HCT: 26.3 % — ABNORMAL LOW (ref 36.0–46.0)
Hemoglobin: 8.1 g/dL — ABNORMAL LOW (ref 12.0–15.0)
MCH: 27.5 pg (ref 26.0–34.0)
MCHC: 30.8 g/dL (ref 30.0–36.0)
MCV: 89.2 fL (ref 80.0–100.0)
Platelets: 327 10*3/uL (ref 150–400)
RBC: 2.95 MIL/uL — ABNORMAL LOW (ref 3.87–5.11)
RDW: 15.4 % (ref 11.5–15.5)
WBC: 11.9 10*3/uL — ABNORMAL HIGH (ref 4.0–10.5)
nRBC: 0 % (ref 0.0–0.2)

## 2018-10-18 MED ORDER — INSULIN ASPART 100 UNIT/ML ~~LOC~~ SOLN
8.0000 [IU] | Freq: Three times a day (TID) | SUBCUTANEOUS | Status: DC
  Administered 2018-10-18 – 2018-10-22 (×9): 8 [IU] via SUBCUTANEOUS

## 2018-10-18 MED ORDER — SODIUM ZIRCONIUM CYCLOSILICATE 5 G PO PACK
5.0000 g | PACK | Freq: Once | ORAL | Status: AC
  Administered 2018-10-18: 5 g via ORAL
  Filled 2018-10-18: qty 1

## 2018-10-18 NOTE — Progress Notes (Signed)
[facility]    NEPHROLOGY PROGRESS NOTE  SUBJECTIVE: Her morning labs haven't come back yet and not currently pending - patient says they were drawn and so does lab.  IV infiltrated yesterday and was replaced.  She has been off of the IV bicarbonate.  She had 1.2 liters UOP over 6/27.    Review of systems:  She denies shortness of breath or chest pain  Denies n/v No rash  Appetite ok    OBJECTIVE:  Vitals:   10/17/18 2352 10/18/18 0335  BP: 136/75 124/61  Pulse: 89 92  Resp: (!) 30 (!) 23  Temp: 98.5 F (36.9 C) 98.6 F (37 C)  SpO2: 100% 98%    Intake/Output Summary (Last 24 hours) at 10/18/2018 0753 Last data filed at 10/18/2018 5631 Gross per 24 hour  Intake 1229.39 ml  Output 1175 ml  Net 54.39 ml      General:  AAOx3 NAD  HEENT: MMM [place] AT anicteric sclera Neck:  No JVD CV:  RRR ; no rub  Lungs reduced with basilar crackles unlabored Abd:  abd SNT/ND Extremities: 1+ lower extremity edema   Skin:  No skin rash GU - foley in place   MEDICATIONS:  . amLODipine  10 mg Oral Daily  . atorvastatin  40 mg Oral q1800  . ciprofloxacin  500 mg Oral Daily  . cloNIDine  0.2 mg Oral BID  . ferrous sulfate  325 mg Oral Q breakfast  . insulin aspart  0-9 Units Subcutaneous TID WC  . insulin aspart  5 Units Subcutaneous TID WC  . insulin detemir  10 Units Subcutaneous BID  . metoprolol tartrate  12.5 mg Oral BID  . polyethylene glycol  17 g Oral Daily  . sodium bicarbonate  1,300 mg Oral BID       LABS:   CBC Latest Ref Rng & Units 10/17/2018 10/16/2018 10/15/2018  WBC 4.0 - 10.5 K/uL 13.8(H) 14.9(H) 14.7(H)  Hemoglobin 12.0 - 15.0 g/dL 9.0(L) 8.6(L) 8.7(L)  Hematocrit 36.0 - 46.0 % 28.2(L) 26.1(L) 26.2(L)  Platelets 150 - 400 K/uL 350 311 276    CMP Latest Ref Rng & Units 10/17/2018 10/16/2018 10/15/2018  Glucose 70 - 99 mg/dL 148(H) 138(H) 149(H)  BUN 8 - 23 mg/dL 75(H) 78(H) 78(H)  Creatinine 0.44 - 1.00 mg/dL 4.63(H) 4.29(H) 4.18(H)  Sodium  135 - 145 mmol/L 133(L) 132(L) 131(L)  Potassium 3.5 - 5.1 mmol/L 4.7 4.7 4.4  Chloride 98 - 111 mmol/L 100 107 105  CO2 22 - 32 mmol/L 18(L) 14(L) 17(L)  Calcium 8.9 - 10.3 mg/dL 7.7(L) 7.4(L) 7.5(L)  Total Protein 6.5 - 8.1 g/dL - - -  Total Bilirubin 0.3 - 1.2 mg/dL - - -  Alkaline Phos 38 - 126 U/L - - -  AST 15 - 41 U/L - - -  ALT 0 - 44 U/L - - -    Lab Results  Component Value Date   CALCIUM 7.7 (L) 10/17/2018   CAION 0.94 (L) 10/06/2018   PHOS 5.3 (H) 10/16/2018       Component Value Date/Time   COLORURINE YELLOW 10/12/2018 1115   APPEARANCEUR CLOUDY (A) 10/12/2018 1115   LABSPEC 1.010 10/12/2018 1115   PHURINE 5.0 10/12/2018 1115   GLUCOSEU 150 (A) 10/12/2018 1115   HGBUR MODERATE (A) 10/12/2018 1115   BILIRUBINUR NEGATIVE 10/12/2018 1115   BILIRUBINUR NEG 07/03/2017 1527   KETONESUR NEGATIVE 10/12/2018 1115   PROTEINUR 100 (A) 10/12/2018 1115   UROBILINOGEN 0.2 07/03/2017 1527  NITRITE NEGATIVE 10/12/2018 1115   LEUKOCYTESUR LARGE (A) 10/12/2018 1115      Component Value Date/Time   HCO3 17.4 (L) 10/06/2018 1115   TCO2 18 (L) 10/06/2018 1115   ACIDBASEDEF 8.0 (H) 10/06/2018 1115   O2SAT 65.0 10/06/2018 1115       Component Value Date/Time   IRON 22 (L) 10/16/2018 0317   TIBC 204 (L) 10/16/2018 0317   FERRITIN 523 (H) 10/16/2018 0317   IRONPCTSAT 11 10/16/2018 0317       ASSESSMENT/PLAN:    63 year old female patient with past medical history of diabetes, hypertension, and hypothyroidism presented for sepsis secondary to pyelonephritis.    1.  Acute kidney injury Previously felt secondary to pre-renal and ischemic insults in the setting of sepsis and pyelo.  Hypotension at home and on ARB-HCTZ prior to admit in setting of AKI.  Limited reserve with uncontrolled diabetes at baseline at HbA1c over 14.  With worsening inpatient cannot r/o AIN and have changed antibiotics 2/2 same though urine eos neg and serum eos ok.  Received short duration of vanc - now  off.  Presented with Cr 2.89 and Note baseline serum creatinine 0.85 from 03/16/2018.  Renal ultrasound revealed no evidence of obstruction.  There is a stone in the mid to lower left kidney.  - Continue foley catheter for retention and AKI  - Await today's labs - She is nonoliguric and do not anticipate need for HD today however if her renal function continues to worsen anticipate starting HD as early as 6/29.  She would want HD if indicated  - She has transitioned off of cephalosporin to cipro in the event this is contributing.  Appears less likely culprit  - serologic GN work-up negative thus far as below - Repeat UA  2. Proteinuria  - 3240 mg/g up/cr ratio.  Feel secondary to diabetes with hemoglobin A1c over 14 - Spep and UPEP with no M spike and free light chains ratio normal.  ANA, ANCA neg   3. Hematuria - Setting of infection as well as stone and improving on repeat.  Note hematuria on 06/2017 study as well.  Complement normal.  With pyelo not ideal for renal biopsy  - ANA, ANCA negative  4. Metabolic acidosis  - Setting of AKI  - Continue bicarb gtt.  S/p PO bicarb awaiting IV access.  - Await labs - stable on medical therapy on last check  5.  Pyelonephritis. Abx per primary team   6. Bacteremia - enterobacter.  Repeat blood cultures from 6/24 are in process and NGTD.  on abx per primary team.  Primary team has obtained a TTE.  If clinical concern for endocarditis TTE report recommends a TEE   7.  Diabetes mellitus.  Per primary team   8.  Anemia - microcytic with iron deficiency.  Defer IV iron with bacteremia.  On oral iron.  Stable.  No acute indication for PRBC's.  aranesp 40 mcg once on 6/26   9.  Thrombocytopenia.  Resolved.  Felt likely related to sepsis.    10.  Hypertension.  Acceptable on current regimen.  Avoid hypotension   Claudia Desanctis 10/18/2018 7:53 AM

## 2018-10-18 NOTE — Progress Notes (Signed)
PROGRESS NOTE    Allison Patel  VPX:[id number] DOB: 1956/02/26 DOA: 10/06/2018 PCP: Elby Showers, MD   Brief Narrative:  63 year old WF PMHX DM type II uncontrolled with complication,, HTN, murmur, prior gluteal abscess, HLD, hypothyroidism, morbid obesity,  Presenting with confusion since Sunday ~3 days. Initially got better with PO fluids but got worse last night into day of admission. Admission labs notable for +UA, actidosis, acute kidney injury, and lactic acidemia. Lactate and mental status did not improve with fluids and abx in ER so intensivist asked to admit.     Subjective: 6/28 A/O x4,, negative S OB, negative abdominal pain, negative N/V.  Sitting on bed comfortably    Assessment & Plan:   Principal Problem:   Severe sepsis ([place]) Active Problems:   Benign essential HTN   Hypercholesterolemia without hypertriglyceridemia   Adult hypothyroidism   Diabetes mellitus type 2, uncontrolled (HCC)   HLD (hyperlipidemia)  Severe sepsis/MODS -Multifactorial bacteremia, acute pyelonephritis.  Treat underlying cause,  Bacteremia positive ENTEROBACTER CLOACAE -Antibiotic changed to ciprofloxacin secondary to continued renal failure.  After discussion with pharmacy was determined that antibiotic would be good for bacteremia as well as acute pyelonephritis. - Complete 14-day course antibiotics; tentative end date 6/30  Acute respiratory failure with hypoxia -Resolved, now on room air  Essential HTN -Amlodipine 10 mg daily -Clonidine 0.2 mg twice daily - Hydralazine PRN -6/24 metoprolol 12.5 mg twice daily - Given that patient will most likely start HD on Monday will not increase or add additional BP agent  Cardiac murmur - Echocardiogram hyperdynamic EF > 65 see results below.    Acute pyelonephritis positive ENTEROBACTER CLOACAE -See bacteremia -6/27 discussed case with nephrology Dr. Harrie Jeans and agreed that risks outweighed the gains of a kidney biopsy  therefore will not obtain one.  Will begin HD on Monday.  Spoke with patient's daughter and explained mothers current options. -ANCA <3.5 -ANA negative -SPEP: Negative evidence of monoclonal protein.  Negative M spike -Free light chains pending  Kappa free light chains elevated 178.9  Lambda free light chain elevated 129.9  Kappa lambda light WNL - Continue with Foley - Strict in and out +7.2 L  AKI/proteinuria/hematuria -Secondary to pyelonephritis, uncontrolled diabetes and hypertension may also have played a part.  In addition awaiting results of labs. - 6/26 Lasix 60 mg x 1 given per nephrology. Recent Labs  Lab 10/14/18 0212 10/15/18 0316 10/16/18 0317 10/17/18 0701 10/18/18 0704  CREATININE 4.12* 4.18* 4.29* 4.63* 4.94*   Hyperkalemia - Lokelma 5 g x 1 dose   Diabetes type 2 uncontrolled with complication - 4/62 hemoglobin A1c= 14 - 6/24 increase Levemir 10 units twice daily - 6/28 increase NovoLog 8 units q. AC  - Severe SSI  HLD - LDL not within ADA/AHA guidelines - LDL goal<70 - 6/25 start Lipitor 40 mg daily  Anemia of chronic disease Recent Labs  Lab 10/13/18 0300 10/14/18 0212 10/15/18 0316 10/16/18 0429 10/17/18 0701  HGB 8.7* 8.4* 8.7* 8.6* 9.0*  -Stable -Anemia panel most consistent with anemia of chronic disease -Occult blood pending - Negative findings of acute bleeding.  Thrombocytopenia -Resolved  Anxiety -Currently not on Xanax while in hospital and and doing well.  Would not restart, would start SSRI if patient requires long-term medication.  If patient requires short-term medication would start diazepam or Ativan.  Morbid obesity - Diabetic nutritionist has seen patient -Body mass index is 35.16 kg/m.    DVT prophylaxis: SCD Code Status: Full Family  Communication: None Disposition Plan: TBD   Consultants:  Nephrology PCCM  Procedures/Significant Events:  6/24 echocardiogram:Left Ventricle:- hyperdynamic systolic  function.  LVEF>65%. -asymmetric left ventricular hypertrophy.  -Left ventricular diastolic Doppler parameters are consistent with pseudonormalization.      I have personally reviewed and interpreted all radiology studies and my findings are as above.  VENTILATOR SETTINGS:    Cultures 6/16 SARS coronavirus negative 6/16 blood left antecubital positive ENTEROBACTER CLOACAE 6/16 blood left wrist positive ENTEROBACTER CLOACAE 6/16 urine positive ENTEROBACTER CLOACAE 6/17 HIV negative 6/24 blood pending    Antimicrobials: Anti-infectives (From admission, onward)   Start     Stop   10/15/18 1000  ciprofloxacin (CIPRO) tablet 500 mg     10/20/18 0959   10/13/18 1230  ceFEPIme (MAXIPIME) 1 g in sodium chloride 0.9 % 100 mL IVPB  Status:  Discontinued     10/14/18 1307   10/09/18 0930  ceFEPIme (MAXIPIME) 2 g in sodium chloride 0.9 % 100 mL IVPB  Status:  Discontinued     10/12/18 1106   10/09/18 0745  cefTRIAXone (ROCEPHIN) 2 g in sodium chloride 0.9 % 100 mL IVPB  Status:  Discontinued     10/09/18 0744   10/07/18 1000  ceFEPIme (MAXIPIME) 2 g in sodium chloride 0.9 % 100 mL IVPB  Status:  Discontinued     10/06/18 1330   10/07/18 0900  ceFEPIme (MAXIPIME) 2 g in sodium chloride 0.9 % 100 mL IVPB  Status:  Discontinued     10/09/18 0739   10/07/18 0800  cefTRIAXone (ROCEPHIN) 2 g in sodium chloride 0.9 % 100 mL IVPB  Status:  Discontinued     10/07/18 0326   10/06/18 1345  cefTRIAXone (ROCEPHIN) 2 g in sodium chloride 0.9 % 100 mL IVPB  Status:  Discontinued     10/06/18 1509   10/06/18 1051  vancomycin variable dose per unstable renal function (pharmacist dosing)  Status:  Discontinued     10/06/18 1334   10/06/18 0900  ceFEPIme (MAXIPIME) 2 g in sodium chloride 0.9 % 100 mL IVPB     10/06/18 0954   10/06/18 0900  metroNIDAZOLE (FLAGYL) IVPB 500 mg     10/06/18 1028   10/06/18 0900  vancomycin (VANCOCIN) IVPB 1000 mg/200 mL premix  Status:  Discontinued     10/06/18 0849    10/06/18 0900  vancomycin (VANCOCIN) 1,500 mg in sodium chloride 0.9 % 500 mL IVPB     10/06/18 1233      Devices    LINES / TUBES:      Continuous Infusions: .  sodium bicarbonate (isotonic) infusion in sterile water Stopped (10/17/18 1556)     Objective: Vitals:   10/17/18 2129 10/17/18 2352 10/18/18 0335 10/18/18 0500  BP: (!) 153/64 136/75 124/61   Pulse:  89 92   Resp:  (!) 30 (!) 23   Temp:  98.5 F (36.9 C) 98.6 F (37 C)   TempSrc:  Oral Oral   SpO2:  100% 98%   Weight:    85.4 kg  Height:        Intake/Output Summary (Last 24 hours) at 10/18/2018 0749 Last data filed at 10/18/2018 0177 Gross per 24 hour  Intake 1229.39 ml  Output 1175 ml  Net 54.39 ml   Filed Weights   10/16/18 0554 10/17/18 0500 10/18/18 0500  Weight: 84.2 kg 85 kg 85.4 kg   Physical Exam:  General: A/O x4, no acute respiratory distress Eyes: negative scleral hemorrhage, negative anisocoria,  negative icterus ENT: Negative Runny nose, negative gingival bleeding, Neck:  Negative scars, masses, torticollis, lymphadenopathy, JVD Lungs: Clear to auscultation bilaterally without wheezes or crackles Cardiovascular: Regular rate and rhythm without murmur gallop or rub normal S1 and S2 Abdomen: Morbidly obese, negative abdominal pain, nondistended, positive soft, bowel sounds, no rebound, no ascites, no appreciable mass Extremities: No significant cyanosis, clubbing, or edema bilateral lower extremities Skin: Negative rashes, lesions, ulcers Psychiatric:  Negative depression, negative anxiety, negative fatigue, negative mania  Central nervous system:  Cranial nerves II through XII intact, tongue/uvula midline, all extremities muscle strength 5/5, sensation intact throughout, negative dysarthria, negative expressive aphasia, negative receptive aphasia.     Data Reviewed: Care during the described time interval was provided by me .  I have reviewed this patient's available data, including  medical history, events of note, physical examination, and all test results as part of my evaluation.   CBC: Recent Labs  Lab 10/13/18 0300 10/14/18 0212 10/15/18 0316 10/16/18 0429 10/17/18 0701  WBC 12.4* 13.0* 14.7* 14.9* 13.8*  NEUTROABS  --  10.2*  --   --   --   HGB 8.7* 8.4* 8.7* 8.6* 9.0*  HCT 25.8* 25.2* 26.2* 26.1* 28.2*  MCV 81.9 83.4 84.5 85.6 86.0  PLT 211 245 276 311 638   Basic Metabolic Panel: Recent Labs  Lab 10/12/18 0231 10/13/18 0300 10/14/18 0212 10/15/18 0316 10/16/18 0317 10/17/18 0701  NA 128* 128* 130* 131* 132* 133*  K 3.8 4.3 4.2 4.4 4.7 4.7  CL 98 99 102 105 107 100  CO2 20* 20* 17* 17* 14* 18*  GLUCOSE 244* 197* 182* 149* 138* 148*  BUN 73* 77* 78* 78* 78* 75*  CREATININE 3.55* 4.04* 4.12* 4.18* 4.29* 4.63*  CALCIUM 7.4* 7.5* 7.3* 7.5* 7.4* 7.7*  MG  --   --   --  2.0 2.0 2.0  PHOS 2.4* 3.4 4.1 4.9* 5.3*  --    GFR: Estimated Creatinine Clearance: 12.1 mL/min (A) (by C-G formula based on SCr of 4.63 mg/dL (H)). Liver Function Tests: Recent Labs  Lab 10/12/18 0231 10/13/18 0300 10/14/18 0212 10/15/18 0316 10/16/18 0317  AST 21  --  17  --   --   ALT 29  --  20  --   --   ALKPHOS 239*  --  233*  --   --   BILITOT 1.7*  --  1.2  --   --   PROT 5.0*  --  5.1*  --   --   ALBUMIN 1.4*  1.4* 1.3* 1.4*  1.4* 1.4* 1.4*   No results for input(s): LIPASE, AMYLASE in the last 168 hours. No results for input(s): AMMONIA in the last 168 hours. Coagulation Profile: No results for input(s): INR, PROTIME in the last 168 hours. Cardiac Enzymes: No results for input(s): CKTOTAL, CKMB, CKMBINDEX, TROPONINI in the last 168 hours. BNP (last 3 results) No results for input(s): PROBNP in the last 8760 hours. HbA1C: No results for input(s): HGBA1C in the last 72 hours. CBG: Recent Labs  Lab 10/17/18 0622 10/17/18 1116 10/17/18 1619 10/17/18 2113 10/18/18 0606  GLUCAP 154* 134* 162* 224* 187*   Lipid Profile: No results for input(s):  CHOL, HDL, LDLCALC, TRIG, CHOLHDL, LDLDIRECT in the last 72 hours. Thyroid Function Tests: No results for input(s): TSH, T4TOTAL, FREET4, T3FREE, THYROIDAB in the last 72 hours. Anemia Panel: Recent Labs    10/16/18 0317 10/16/18 0429  VITAMINB12 1,429*  --   FOLATE 15.4  --  FERRITIN 523*  --   TIBC 204*  --   IRON 22*  --   RETICCTPCT  --  2.3   Urine analysis:    Component Value Date/Time   COLORURINE YELLOW 10/12/2018 1115   APPEARANCEUR CLOUDY (A) 10/12/2018 1115   LABSPEC 1.010 10/12/2018 1115   PHURINE 5.0 10/12/2018 1115   GLUCOSEU 150 (A) 10/12/2018 1115   HGBUR MODERATE (A) 10/12/2018 1115   BILIRUBINUR NEGATIVE 10/12/2018 1115   BILIRUBINUR NEG 07/03/2017 1527   KETONESUR NEGATIVE 10/12/2018 1115   PROTEINUR 100 (A) 10/12/2018 1115   UROBILINOGEN 0.2 07/03/2017 1527   NITRITE NEGATIVE 10/12/2018 1115   LEUKOCYTESUR LARGE (A) 10/12/2018 1115   Sepsis Labs: @LABRCNTIP (procalcitonin:4,lacticidven:4)  ) Recent Results (from the past 240 hour(s))  Culture, blood (routine x 2)     Status: None (Preliminary result)   Collection Time: 10/14/18  9:00 AM   Specimen: BLOOD  Result Value Ref Range Status   Specimen Description BLOOD RIGHT ANTECUBITAL  Final   Special Requests   Final    BOTTLES DRAWN AEROBIC ONLY Blood Culture results may not be optimal due to an inadequate volume of blood received in culture bottles   Culture   Final    NO GROWTH 3 DAYS Performed at [facility], [place] [address], [place], [place] [address]    Report Status PENDING  Incomplete  Culture, blood (routine x 2)     Status: None (Preliminary result)   Collection Time: 10/14/18  9:08 AM   Specimen: BLOOD RIGHT HAND  Result Value Ref Range Status   Specimen Description BLOOD RIGHT HAND  Final   Special Requests   Final    BOTTLES DRAWN AEROBIC ONLY Blood Culture adequate volume   Culture   Final    NO GROWTH 3 DAYS Performed at [facility], [place] [address]  [place], [place] [address]    Report Status PENDING  Incomplete         Radiology Studies: No results found.      Scheduled Meds: . amLODipine  10 mg Oral Daily  . atorvastatin  40 mg Oral q1800  . ciprofloxacin  500 mg Oral Daily  . cloNIDine  0.2 mg Oral BID  . ferrous sulfate  325 mg Oral Q breakfast  . insulin aspart  0-9 Units Subcutaneous TID WC  . insulin aspart  5 Units Subcutaneous TID WC  . insulin detemir  10 Units Subcutaneous BID  . metoprolol tartrate  12.5 mg Oral BID  . polyethylene glycol  17 g Oral Daily  . sodium bicarbonate  1,300 mg Oral BID   Continuous Infusions: .  sodium bicarbonate (isotonic) infusion in sterile water Stopped (10/17/18 1556)     LOS: 12 days   The patient is critically ill with multiple organ systems failure and requires high complexity decision making for assessment and support, frequent evaluation and titration of therapies, application of advanced monitoring technologies and extensive interpretation of multiple databases. Critical Care Time devoted to patient care services described in this note  Time spent: 40 minutes     , Geraldo Docker, MD [facility] Pager [phone]  If 7PM-7AM, please contact night-coverage [email or web address] Password [facility] 10/18/2018, 7:49 AM

## 2018-10-18 NOTE — Progress Notes (Signed)
Pt ambulated in a hallway without distress, Foley catheter taken out after getting order from MD, pt denies pain, will continue to monitor  Palma Holter, RN

## 2018-10-18 NOTE — Plan of Care (Signed)

## 2018-10-19 ENCOUNTER — Other Ambulatory Visit: Payer: Self-pay

## 2018-10-19 DIAGNOSIS — A4159 Other Gram-negative sepsis: Secondary | ICD-10-CM | POA: Diagnosis not present

## 2018-10-19 LAB — CULTURE, BLOOD (ROUTINE X 2)
Culture: NO GROWTH
Culture: NO GROWTH
Special Requests: ADEQUATE

## 2018-10-19 LAB — CBC
HCT: 23.3 % — ABNORMAL LOW (ref 36.0–46.0)
Hemoglobin: 7.5 g/dL — ABNORMAL LOW (ref 12.0–15.0)
MCH: 27.8 pg (ref 26.0–34.0)
MCHC: 32.2 g/dL (ref 30.0–36.0)
MCV: 86.3 fL (ref 80.0–100.0)
Platelets: 385 10*3/uL (ref 150–400)
RBC: 2.7 MIL/uL — ABNORMAL LOW (ref 3.87–5.11)
RDW: 15.2 % (ref 11.5–15.5)
WBC: 12.2 10*3/uL — ABNORMAL HIGH (ref 4.0–10.5)
nRBC: 0 % (ref 0.0–0.2)

## 2018-10-19 LAB — RENAL FUNCTION PANEL
Albumin: 1.6 g/dL — ABNORMAL LOW (ref 3.5–5.0)
Anion gap: 10 (ref 5–15)
BUN: 76 mg/dL — ABNORMAL HIGH (ref 8–23)
CO2: 22 mmol/L (ref 22–32)
Calcium: 7.8 mg/dL — ABNORMAL LOW (ref 8.9–10.3)
Chloride: 102 mmol/L (ref 98–111)
Creatinine, Ser: 5.36 mg/dL — ABNORMAL HIGH (ref 0.44–1.00)
GFR calc Af Amer: 9 mL/min — ABNORMAL LOW (ref >60)
GFR calc non Af Amer: 8 mL/min — ABNORMAL LOW (ref >60)
Glucose, Bld: 146 mg/dL — ABNORMAL HIGH (ref 70–99)
Phosphorus: 6.7 mg/dL — ABNORMAL HIGH (ref 2.5–4.6)
Potassium: 5.1 mmol/L (ref 3.5–5.1)
Sodium: 134 mmol/L — ABNORMAL LOW (ref 135–145)

## 2018-10-19 LAB — GLUCOSE, CAPILLARY
Glucose-Capillary: 146 mg/dL — ABNORMAL HIGH (ref 70–99)
Glucose-Capillary: 126 mg/dL — ABNORMAL HIGH (ref 70–99)
Glucose-Capillary: 179 mg/dL — ABNORMAL HIGH (ref 70–99)
Glucose-Capillary: 114 mg/dL — ABNORMAL HIGH (ref 70–99)

## 2018-10-19 LAB — BASIC METABOLIC PANEL
Anion gap: 11 (ref 5–15)
BUN: 75 mg/dL — ABNORMAL HIGH (ref 8–23)
CO2: 22 mmol/L (ref 22–32)
Calcium: 7.8 mg/dL — ABNORMAL LOW (ref 8.9–10.3)
Chloride: 100 mmol/L (ref 98–111)
Creatinine, Ser: 5.44 mg/dL — ABNORMAL HIGH (ref 0.44–1.00)
GFR calc Af Amer: 9 mL/min — ABNORMAL LOW (ref >60)
GFR calc non Af Amer: 8 mL/min — ABNORMAL LOW (ref >60)
Glucose, Bld: 183 mg/dL — ABNORMAL HIGH (ref 70–99)
Potassium: 4.8 mmol/L (ref 3.5–5.1)
Sodium: 133 mmol/L — ABNORMAL LOW (ref 135–145)

## 2018-10-19 LAB — MAGNESIUM
Magnesium: 2 mg/dL (ref 1.7–2.4)
Magnesium: 1.9 mg/dL (ref 1.7–2.4)

## 2018-10-19 MED ORDER — INSULIN DETEMIR 100 UNIT/ML ~~LOC~~ SOLN
14.0000 [IU] | Freq: Two times a day (BID) | SUBCUTANEOUS | Status: DC
  Administered 2018-10-19 – 2018-10-21 (×3): 14 [IU] via SUBCUTANEOUS
  Filled 2018-10-19 (×6): qty 0.14

## 2018-10-19 MED ORDER — SODIUM ZIRCONIUM CYCLOSILICATE 10 G PO PACK
10.0000 g | PACK | Freq: Once | ORAL | Status: AC
  Administered 2018-10-19: 10 g via ORAL
  Filled 2018-10-19: qty 1

## 2018-10-19 NOTE — Progress Notes (Addendum)
[facility] NEPHROLOGY PROGRESS NOTE  Assessment/ Plan: Pt is a 63 y.o. yo female with history of hypertension, diabetes, hypothyroid presents presented with sepsis due to pyelonephritis consulted for AKI.  #Acute kidney injury, nonoliguric likely due to sepsis and pyelonephritis concomitant with the hemodynamic change related with ARB/HCTZ: -Patient has poorly controlled diabetes with hba1c>14 therefore she probably has underlying CKD.  UA with 3.2 g proteinuria.  Other serology including ANA, ANCA, kappa lamda ratio, complements negative.  Renal US with no obstruction. -She is nonoliguric, no edema and no uremic features.The serum creatinine level is mildly elevated to 5.3. No indication for dialysis today.  Continue to watch for renal recovery and hopefully we can avoid dialysis.  Can resume diet. Daily assessment for dialysis need, keep npo post-mid night. I have discussed this with the patient. -continue sod bicarbonate.  #Proteinuria probably due to diabetic nephropathy.  Needs glycemic control.   #Pyelonephritis: On Cipro per primary team.  #Hypertension: Monitor blood pressure.  Continue current medication.  #Enterobacter bacteremia: On Cipro for 14 days.  #Hyperkalemia: K 5.1 today, Improved with Lokelma.  Order another [employer]. Monitor electrolytes.  Subjective: Seen and examined at bedside.  Patient was lying flat on bed comfortable, denied headache, dizziness, nausea, vomiting, dysgeusia, chest pain, shortness of breath.  Reports good urine output. Objective Vital signs in last 24 hours: Vitals:   10/18/18 2127 10/18/18 2300 10/19/18 0300 10/19/18 0700  BP: (!) 149/70 132/64 (!) 113/53 (!) 143/76  Pulse: (!) 101 89 89 92  Resp:  (!) 29 (!) 31   Temp: 97.8 F (36.6 C) 98.5 F (36.9 C) 98.6 F (37 C) 98.4 F (36.9 C)  TempSrc: Oral Oral Oral Oral  SpO2: 90% 99% 99% 93%  Weight:   85.6 kg   Height:       Weight change: 0.2 kg  Intake/Output  Summary (Last 24 hours) at 10/19/2018 0811 Last data filed at 10/18/2018 2200 Gross per 24 hour  Intake 820 ml  Output 500 ml  Net 320 ml       Labs: Basic Metabolic Panel: Recent Labs  Lab 10/16/18 0317  10/18/18 0704 10/18/18 1207 10/19/18 0649  NA 132*   < > 134* 133* 134*  K 4.7   < > 5.7* 4.7 5.1  CL 107   < > 104 102 102  CO2 14*   < > 17* 22 22  GLUCOSE 138*   < > 169* 174* 146*  BUN 78*   < > 78* 75* 76*  CREATININE 4.29*   < > 4.94* 4.95* 5.36*  CALCIUM 7.4*   < > 7.4* 7.7* 7.8*  PHOS 5.3*  --  6.2*  --  6.7*   < > = values in this interval not displayed.   Liver Function Tests: Recent Labs  Lab 10/14/18 0212  10/16/18 0317 10/18/18 0704 10/19/18 0649  AST 17  --   --   --   --   ALT 20  --   --   --   --   ALKPHOS 233*  --   --   --   --   BILITOT 1.2  --   --   --   --   PROT 5.1*  --   --   --   --   ALBUMIN 1.4*  1.4*   < > 1.4* 1.5* 1.6*   < > = values in this interval not displayed.   No results for input(s): LIPASE, AMYLASE  in the last 168 hours. No results for input(s): AMMONIA in the last 168 hours. CBC: Recent Labs  Lab 10/14/18 0212 10/15/18 0316 10/16/18 0429 10/17/18 0701 10/18/18 0704 10/19/18 0649  WBC 13.0* 14.7* 14.9* 13.8* 11.9* 12.2*  NEUTROABS 10.2*  --   --   --   --   --   HGB 8.4* 8.7* 8.6* 9.0* 8.1* 7.5*  HCT 25.2* 26.2* 26.1* 28.2* 26.3* 23.3*  MCV 83.4 84.5 85.6 86.0 89.2 86.3  PLT 245 276 311 350 327 385   Cardiac Enzymes: No results for input(s): CKTOTAL, CKMB, CKMBINDEX, TROPONINI in the last 168 hours. CBG: Recent Labs  Lab 10/17/18 2113 10/18/18 0606 10/18/18 1111 10/18/18 1605 10/19/18 0627  GLUCAP 224* 187* 149* 126* 146*    Iron Studies: No results for input(s): IRON, TIBC, TRANSFERRIN, FERRITIN in the last 72 hours. Studies/Results: No results found.  Medications: Infusions:   Scheduled Medications: . amLODipine  10 mg Oral Daily  . atorvastatin  40 mg Oral q1800  . ciprofloxacin  500 mg  Oral Daily  . cloNIDine  0.2 mg Oral BID  . ferrous sulfate  325 mg Oral Q breakfast  . insulin aspart  0-9 Units Subcutaneous TID WC  . insulin aspart  8 Units Subcutaneous TID WC  . insulin detemir  10 Units Subcutaneous BID  . metoprolol tartrate  12.5 mg Oral BID  . polyethylene glycol  17 g Oral Daily  . sodium bicarbonate  1,300 mg Oral BID    have reviewed scheduled and prn medications.  Physical Exam: General:NAD, comfortable Heart:RRR, s1s2 nl, no rubs Lungs:clear b/l, no crackle Abdomen:soft, Non-tender, non-distended Extremities:No edema Neurologic: Alert, awake, following commands.    Vanessa Kampf Prasad Bertie Mcconathy 10/19/2018,8:11 AM  LOS: 13 days  Pager: [phone]

## 2018-10-19 NOTE — Progress Notes (Signed)
Physical Therapy Treatment Patient Details Name: Allison Patel MRN: [id number] DOB: 1955-07-06 Today's Date: 10/19/2018    History of Present Illness 63 year old woman with DM, HTN, prior gluteal abscess presenting with confusion since Sunday ~3 days. hx DM, HTN p/w severe sepsis secondary to enterobacter pyelnonephritis with hematogenous spread.    PT Comments    Patient mobilizing well with main limiting factor of general weakness (per patient report). HR elevated 15 bpm with ambulation with no dyspnea. Patient anxious when asked to attempt ambulation without RW (she did not use prior to admission) and required vc to lighten her UE support while walking and with exercises at counter. Overall continues to improve with original goals met and goals updated.     Follow Up Recommendations  Home health PT;Supervision/Assistance - 24 hour     Equipment Recommendations  Rolling walker with 5" wheels;3in1 (PT)    Recommendations for Other Services       Precautions / Restrictions Precautions Precautions: Fall Restrictions Weight Bearing Restrictions: No    Mobility  Bed Mobility                  Transfers Overall transfer level: Needs assistance Equipment used: Rolling walker (2 wheeled) Transfers: Sit to/from Stand Sit to Stand: Supervision         General transfer comment: vc for proper sequencing; x 2  Ambulation/Gait Ambulation/Gait assistance: Min guard Gait Distance (Feet): 180 Feet Assistive device: Rolling walker (2 wheeled) Gait Pattern/deviations: Step-through pattern;Decreased stride length;Staggering left;Wide base of support Gait velocity: slowed   General Gait Details: min guard for safety with slow, guarded gait, self limiting; pt not comfortable attempting without RW, but did change from full grip to resting palms on handles (light UE support)   Stairs             Wheelchair Mobility    Modified Rankin (Stroke Patients Only)        Balance Overall balance assessment: Needs assistance         Standing balance support: No upper extremity supported Standing balance-Leahy Scale: Fair Standing balance comment: static balance no UE support; UE support for standing ex's                            Cognition Arousal/Alertness: Awake/alert Behavior During Therapy: WFL for tasks assessed/performed Overall Cognitive Status: Within Functional Limits for tasks assessed                                        Exercises General Exercises - Lower Extremity Long Arc Quad: AROM;Both;10 reps;Seated Hip ABduction/ADduction: AROM;Both;Standing;20 reps(alternating legs) Toe Raises: AROM;10 reps;Standing(bil UE support) Heel Raises: AROM;10 reps;Standing(bilUE support) Mini-Sqauts: Strengthening;Both;10 reps;Standing(bil UE support) Other Exercises Other Exercises: standing knee flexion, alternating legs x 20 with bil UE support    General Comments        Pertinent Vitals/Pain Pain Assessment: No/denies pain    Home Living                      Prior Function            PT Goals (current goals can now be found in the care plan section) Acute Rehab PT Goals Patient Stated Goal: to go home PT Goal Formulation: With patient Time For Goal Achievement: 11/02/18 Potential to Achieve Goals: Good Progress  towards PT goals: Goals met and updated - see care plan    Frequency    Min 3X/week      PT Plan Current plan remains appropriate    Co-evaluation              AM-PAC PT "6 Clicks" Mobility   Outcome Measure  Help needed turning from your back to your side while in a flat bed without using bedrails?: None Help needed moving from lying on your back to sitting on the side of a flat bed without using bedrails?: None Help needed moving to and from a bed to a chair (including a wheelchair)?: A Little Help needed standing up from a chair using your arms (e.g., wheelchair or  bedside chair)?: A Little Help needed to walk in hospital room?: A Little Help needed climbing 3-5 steps with a railing? : A Lot 6 Click Score: 19    End of Session Equipment Utilized During Treatment: Gait belt Activity Tolerance: Patient limited by fatigue Patient left: with call bell/phone within reach;in chair(not on alarm per nsg)   PT Visit Diagnosis: Muscle weakness (generalized) (M62.81);Unsteadiness on feet (R26.81)     Time: 1292-9090 PT Time Calculation (min) (ACUTE ONLY): 18 min  Charges:  $Therapeutic Exercise: 8-22 mins                        [employer], PT 10/19/2018, 10:55 AM

## 2018-10-19 NOTE — Progress Notes (Signed)
PROGRESS NOTE    Allison Patel  AQT:[id number] DOB: [birth year] DOA: 10/06/2018 PCP: Elby Showers, MD   Brief Narrative:  63 year old WF PMHX DM type II uncontrolled with complication,, HTN, murmur, prior gluteal abscess, HLD, hypothyroidism, morbid obesity,  Presenting with confusion since Sunday ~3 days. Initially got better with PO fluids but got worse last night into day of admission. Admission labs notable for +UA, actidosis, acute kidney injury, and lactic acidemia. Lactate and mental status did not improve with fluids and abx in ER so intensivist asked to admit.     Subjective: 6/29 A/O x4, negative S OB, negative abdominal pain, negative N/V.  Sitting in chair comfortably.    Assessment & Plan:   Principal Problem:   Severe sepsis ([place]) Active Problems:   Benign essential HTN   Hypercholesterolemia without hypertriglyceridemia   Adult hypothyroidism   Diabetes mellitus type 2, uncontrolled (HCC)   HLD (hyperlipidemia)  Severe sepsis/MODS -Multifactorial bacteremia, acute pyelonephritis.  Treat underlying cause,  Bacteremia positive ENTEROBACTER CLOACAE -Antibiotic changed to ciprofloxacin secondary to continued renal failure.  After discussion with pharmacy was determined that antibiotic would be good for bacteremia as well as acute pyelonephritis. - Complete 14-day course antibiotics; tentative end date 6/30  Acute respiratory failure with hypoxia -Resolved, now on room air  Essential HTN -Amlodipine 10 mg daily -Clonidine 0.2 mg twice daily - Hydralazine PRN -6/24 metoprolol 12.5 mg twice daily - Given that patient will most likely start HD sometime this week will not add additional BP agent   Cardiac murmur - Echocardiogram hyperdynamic EF > 65 see results below.    Acute pyelonephritis positive ENTEROBACTER CLOACAE -See bacteremia -6/27 discussed case with nephrology Dr. Harrie Jeans and agreed that risks outweighed the gains of a kidney biopsy  therefore will not obtain one.  Will begin HD on Monday.  Spoke with patient's daughter and explained mothers current options. -ANCA <3.5 -ANA negative -SPEP: Negative evidence of monoclonal protein.  Negative M spike -Free light chains pending  Kappa free light chains elevated 178.9  Lambda free light chain elevated 129.9  Kappa lambda light WNL - Continue with Foley - Strict in and out +7.2 L  AKI/proteinuria/hematuria -Secondary to pyelonephritis, uncontrolled diabetes and hypertension may also have played a part.  In addition awaiting results of labs. - 6/26 Lasix 60 mg x 1 given per nephrology. Recent Labs  Lab 10/16/18 0317 10/17/18 0701 10/18/18 0704 10/18/18 1207 10/19/18 0649  CREATININE 4.29* 4.63* 4.94* 4.95* 5.36*  - 6/29 still awaiting nephrology decision on when/if HD to start  Hyperkalemia - Lokelma 10 g x 1 dose  Diabetes type 2 uncontrolled with complication - 5/62 hemoglobin A1c= 14 - 6/29 increase Levemir 14 units BID - 6/28 increase NovoLog 8 units q. AC  - Severe SSI  HLD - LDL not within ADA/AHA guidelines - LDL goal<70 - 6/25 start Lipitor 40 mg daily  Anemia of chronic disease Recent Labs  Lab 10/15/18 0316 10/16/18 0429 10/17/18 0701 10/18/18 0704 10/19/18 0649  HGB 8.7* 8.6* 9.0* 8.1* 7.5*  -Stable -Anemia panel most consistent with anemia of chronic disease -Occult blood pending - Negative findings of acute bleeding.  Thrombocytopenia -Resolved  Anxiety -Currently not on Xanax while in hospital and and doing well.  Would not restart, would start SSRI if patient requires long-term medication.  If patient requires short-term medication would start diazepam or Ativan.  Morbid obesity - Diabetic nutritionist has seen patient -Body mass index is 35.16 kg/m.  DVT prophylaxis: SCD Code Status: Full Family Communication: None Disposition Plan: TBD   Consultants:  Nephrology PCCM  Procedures/Significant Events:  6/24  echocardiogram:Left Ventricle:- hyperdynamic systolic function.  LVEF>65%. -asymmetric left ventricular hypertrophy.  -Left ventricular diastolic Doppler parameters are consistent with pseudonormalization.      I have personally reviewed and interpreted all radiology studies and my findings are as above.  VENTILATOR SETTINGS:    Cultures 6/16 SARS coronavirus negative 6/16 blood left antecubital positive ENTEROBACTER CLOACAE 6/16 blood left wrist positive ENTEROBACTER CLOACAE 6/16 urine positive ENTEROBACTER CLOACAE 6/17 HIV negative 6/24 blood pending    Antimicrobials: Anti-infectives (From admission, onward)   Start     Stop   10/15/18 1000  ciprofloxacin (CIPRO) tablet 500 mg     10/20/18 0959   10/13/18 1230  ceFEPIme (MAXIPIME) 1 g in sodium chloride 0.9 % 100 mL IVPB  Status:  Discontinued     10/14/18 1307   10/09/18 0930  ceFEPIme (MAXIPIME) 2 g in sodium chloride 0.9 % 100 mL IVPB  Status:  Discontinued     10/12/18 1106   10/09/18 0745  cefTRIAXone (ROCEPHIN) 2 g in sodium chloride 0.9 % 100 mL IVPB  Status:  Discontinued     10/09/18 0744   10/07/18 1000  ceFEPIme (MAXIPIME) 2 g in sodium chloride 0.9 % 100 mL IVPB  Status:  Discontinued     10/06/18 1330   10/07/18 0900  ceFEPIme (MAXIPIME) 2 g in sodium chloride 0.9 % 100 mL IVPB  Status:  Discontinued     10/09/18 0739   10/07/18 0800  cefTRIAXone (ROCEPHIN) 2 g in sodium chloride 0.9 % 100 mL IVPB  Status:  Discontinued     10/07/18 0326   10/06/18 1345  cefTRIAXone (ROCEPHIN) 2 g in sodium chloride 0.9 % 100 mL IVPB  Status:  Discontinued     10/06/18 1509   10/06/18 1051  vancomycin variable dose per unstable renal function (pharmacist dosing)  Status:  Discontinued     10/06/18 1334   10/06/18 0900  ceFEPIme (MAXIPIME) 2 g in sodium chloride 0.9 % 100 mL IVPB     10/06/18 0954   10/06/18 0900  metroNIDAZOLE (FLAGYL) IVPB 500 mg     10/06/18 1028   10/06/18 0900  vancomycin (VANCOCIN) IVPB 1000 mg/200  mL premix  Status:  Discontinued     10/06/18 0849   10/06/18 0900  vancomycin (VANCOCIN) 1,500 mg in sodium chloride 0.9 % 500 mL IVPB     10/06/18 1233      Devices    LINES / TUBES:      Continuous Infusions:    Objective: Vitals:   10/18/18 2127 10/18/18 2300 10/19/18 0300 10/19/18 0700  BP: (!) 149/70 132/64 (!) 113/53 (!) 143/76  Pulse: (!) 101 89 89 92  Resp:  (!) 29 (!) 31   Temp: 97.8 F (36.6 C) 98.5 F (36.9 C) 98.6 F (37 C) 98.4 F (36.9 C)  TempSrc: Oral Oral Oral Oral  SpO2: 90% 99% 99% 93%  Weight:   85.6 kg   Height:        Intake/Output Summary (Last 24 hours) at 10/19/2018 0842 Last data filed at 10/18/2018 2200 Gross per 24 hour  Intake 820 ml  Output 500 ml  Net 320 ml   Filed Weights   10/17/18 0500 10/18/18 0500 10/19/18 0300  Weight: 85 kg 85.4 kg 85.6 kg   Physical Exam:  General: A/O x4, no acute respiratory distress Eyes: negative scleral  hemorrhage, negative anisocoria, negative icterus ENT: Negative Runny nose, negative gingival bleeding, Neck:  Negative scars, masses, torticollis, lymphadenopathy, JVD Lungs: Clear to auscultation bilaterally without wheezes or crackles Cardiovascular: Regular rate and rhythm without murmur gallop or rub normal S1 and S2 Abdomen: Morbidly obese, negative abdominal pain, nondistended, positive soft, bowel sounds, no rebound, no ascites, no appreciable mass Extremities: No significant cyanosis, clubbing, or edema bilateral lower extremities Skin: Negative rashes, lesions, ulcers Psychiatric:  Negative depression, negative anxiety, negative fatigue, negative mania  Central nervous system:  Cranial nerves II through XII intact, tongue/uvula midline, all extremities muscle strength 5/5, sensation intact throughout, finger nose finger bilateral within normal limits, negative dysarthria, negative expressive aphasia, negative receptive aphasia.    Data Reviewed: Care during the described time interval  was provided by me .  I have reviewed this patient's available data, including medical history, events of note, physical examination, and all test results as part of my evaluation.   CBC: Recent Labs  Lab 10/14/18 0212 10/15/18 0316 10/16/18 0429 10/17/18 0701 10/18/18 0704 10/19/18 0649  WBC 13.0* 14.7* 14.9* 13.8* 11.9* 12.2*  NEUTROABS 10.2*  --   --   --   --   --   HGB 8.4* 8.7* 8.6* 9.0* 8.1* 7.5*  HCT 25.2* 26.2* 26.1* 28.2* 26.3* 23.3*  MCV 83.4 84.5 85.6 86.0 89.2 86.3  PLT 245 276 311 350 327 253   Basic Metabolic Panel: Recent Labs  Lab 10/14/18 0212 10/15/18 0316 10/16/18 0317 10/17/18 0701 10/18/18 0704 10/18/18 1207 10/19/18 0649  NA 130* 131* 132* 133* 134* 133* 134*  K 4.2 4.4 4.7 4.7 5.7* 4.7 5.1  CL 102 105 107 100 104 102 102  CO2 17* 17* 14* 18* 17* 22 22  GLUCOSE 182* 149* 138* 148* 169* 174* 146*  BUN 78* 78* 78* 75* 78* 75* 76*  CREATININE 4.12* 4.18* 4.29* 4.63* 4.94* 4.95* 5.36*  CALCIUM 7.3* 7.5* 7.4* 7.7* 7.4* 7.7* 7.8*  MG  --  2.0 2.0 2.0 1.9  --  2.0  PHOS 4.1 4.9* 5.3*  --  6.2*  --  6.7*   GFR: Estimated Creatinine Clearance: 10.5 mL/min (A) (by C-G formula based on SCr of 5.36 mg/dL (H)). Liver Function Tests: Recent Labs  Lab 10/14/18 0212 10/15/18 0316 10/16/18 0317 10/18/18 0704 10/19/18 0649  AST 17  --   --   --   --   ALT 20  --   --   --   --   ALKPHOS 233*  --   --   --   --   BILITOT 1.2  --   --   --   --   PROT 5.1*  --   --   --   --   ALBUMIN 1.4*  1.4* 1.4* 1.4* 1.5* 1.6*   No results for input(s): LIPASE, AMYLASE in the last 168 hours. No results for input(s): AMMONIA in the last 168 hours. Coagulation Profile: No results for input(s): INR, PROTIME in the last 168 hours. Cardiac Enzymes: No results for input(s): CKTOTAL, CKMB, CKMBINDEX, TROPONINI in the last 168 hours. BNP (last 3 results) No results for input(s): PROBNP in the last 8760 hours. HbA1C: No results for input(s): HGBA1C in the last 72  hours. CBG: Recent Labs  Lab 10/17/18 2113 10/18/18 0606 10/18/18 1111 10/18/18 1605 10/19/18 0627  GLUCAP 224* 187* 149* 126* 146*   Lipid Profile: No results for input(s): CHOL, HDL, LDLCALC, TRIG, CHOLHDL, LDLDIRECT in the last 72 hours.  Thyroid Function Tests: No results for input(s): TSH, T4TOTAL, FREET4, T3FREE, THYROIDAB in the last 72 hours. Anemia Panel: No results for input(s): VITAMINB12, FOLATE, FERRITIN, TIBC, IRON, RETICCTPCT in the last 72 hours. Urine analysis:    Component Value Date/Time   COLORURINE YELLOW 10/18/2018 0958   APPEARANCEUR HAZY (A) 10/18/2018 0958   LABSPEC 1.009 10/18/2018 0958   PHURINE 6.0 10/18/2018 0958   GLUCOSEU 50 (A) 10/18/2018 0958   HGBUR MODERATE (A) 10/18/2018 0958   BILIRUBINUR NEGATIVE 10/18/2018 0958   BILIRUBINUR NEG 07/03/2017 1527   KETONESUR NEGATIVE 10/18/2018 0958   PROTEINUR 100 (A) 10/18/2018 0958   UROBILINOGEN 0.2 07/03/2017 1527   NITRITE NEGATIVE 10/18/2018 0958   LEUKOCYTESUR LARGE (A) 10/18/2018 0958   Sepsis Labs: @LABRCNTIP (procalcitonin:4,lacticidven:4)  ) Recent Results (from the past 240 hour(s))  Culture, blood (routine x 2)     Status: None (Preliminary result)   Collection Time: 10/14/18  9:00 AM   Specimen: BLOOD  Result Value Ref Range Status   Specimen Description BLOOD RIGHT ANTECUBITAL  Final   Special Requests   Final    BOTTLES DRAWN AEROBIC ONLY Blood Culture results may not be optimal due to an inadequate volume of blood received in culture bottles   Culture   Final    NO GROWTH 4 DAYS Performed at [facility], [place] [address] [place], [place] [address]    Report Status PENDING  Incomplete  Culture, blood (routine x 2)     Status: None (Preliminary result)   Collection Time: 10/14/18  9:08 AM   Specimen: BLOOD RIGHT HAND  Result Value Ref Range Status   Specimen Description BLOOD RIGHT HAND  Final   Special Requests   Final    BOTTLES DRAWN AEROBIC ONLY Blood Culture  adequate volume   Culture   Final    NO GROWTH 4 DAYS Performed at [facility], [place] [address] [place],  [address]    Report Status PENDING  Incomplete         Radiology Studies: No results found.      Scheduled Meds: . amLODipine  10 mg Oral Daily  . atorvastatin  40 mg Oral q1800  . ciprofloxacin  500 mg Oral Daily  . cloNIDine  0.2 mg Oral BID  . ferrous sulfate  325 mg Oral Q breakfast  . insulin aspart  0-9 Units Subcutaneous TID WC  . insulin aspart  8 Units Subcutaneous TID WC  . insulin detemir  10 Units Subcutaneous BID  . metoprolol tartrate  12.5 mg Oral BID  . polyethylene glycol  17 g Oral Daily  . sodium bicarbonate  1,300 mg Oral BID  . sodium zirconium cyclosilicate  10 g Oral Once   Continuous Infusions:    LOS: 13 days   The patient is critically ill with multiple organ systems failure and requires high complexity decision making for assessment and support, frequent evaluation and titration of therapies, application of advanced monitoring technologies and extensive interpretation of multiple databases. Critical Care Time devoted to patient care services described in this note  Time spent: 40 minutes     Allison Patel, Geraldo Docker, MD [facility] Pager [phone]  If 7PM-7AM, please contact night-coverage [email or web address] Password [facility] 10/19/2018, 8:42 AM

## 2018-10-19 NOTE — Progress Notes (Signed)
Initial Nutrition Assessment  RD working remotely.  DOCUMENTATION CODES:   Obesity unspecified  INTERVENTION:   - Nepro Shake po BID, each supplement provides 425 kcal and 19 grams protein  NUTRITION DIAGNOSIS:   Increased nutrient needs related to acute illness (sepsis, AKI) as evidenced by estimated needs.  GOAL:   Patient will meet greater than or equal to 90% of their needs  MONITOR:   PO intake, Supplement acceptance, Labs, I & O's, Weight trends  REASON FOR ASSESSMENT:   LOS    ASSESSMENT:   62 year old female who presented to the ED on 6/16 with AMS and fever. PMH of HTN, T2DM, HLD. Pt admitted with severe sepsis secondary to enterobacter pyelonephritis with hematogenous spread. Pt developed AKI.  Per Nephrology, no need for HD today. Daily assessment for need for dialysis but hopeful for renal recovery and avoidance of dialysis.  Weight up 18 lbs since first measured weight on 6/17. Per RN edema assessment, pt with non-pitting edema to RUE. No other edema documented.  Unable to reach pt via phone call to room.  RD will order Nepro oral nutrition supplement to aid pt in meeting kcal and protein needs. Will also monitor for need for dialysis and HD diet education.  Meal Completion: 50-100% x last 8 recorded meals (averaging ~75%)  Medications reviewed and include: ferrous sulfate, SSI, Novolog 8 units TID with meals, Levemir 14 units BID, Miralax, sodium bicarb 1300 mg BID  Labs reviewed: sodium 134, BUN 76, creatinine 5.36, phosphorus 6.7, hemoglobin 7.5 CBG's: 126-146 x 24 hours  UOP: 500 ml x 24 hours I/O's: +7.2 L since admit  NUTRITION - FOCUSED PHYSICAL EXAM:  Unable to complete at this time. RD working remotely.  Diet Order:   Diet Order            Diet NPO time specified  Diet effective midnight        Diet renal/carb modified with fluid restriction Diet-HS Snack? Nothing; Fluid restriction: 1200 mL Fluid; Room service appropriate? Yes; Fluid  consistency: Thin  Diet effective now              EDUCATION NEEDS:   No education needs have been identified at this time  Skin:  Skin Assessment: Reviewed RN Assessment  Last BM:  10/18/18 large type 6  Height:   Ht Readings from Last 1 Encounters:  10/06/18 4' 11.5" (1.511 m)    Weight:   Wt Readings from Last 1 Encounters:  10/19/18 85.6 kg    Ideal Body Weight:  45.5 kg  BMI:  Body mass index is 37.48 kg/m.  Estimated Nutritional Needs:   Kcal:  1700-1900  Protein:  85-100 grams  Fluid:  1.5 L    Gaynell Face, MS, RD, LDN Inpatient Clinical Dietitian Pager: [phone] Weekend/After Hours: [phone]

## 2018-10-19 NOTE — Progress Notes (Signed)
Pt had a 15 run of nonsustained v.tach. Dr Sherral Hammers informed, ecg done, orders made for labs, same done.

## 2018-10-20 DIAGNOSIS — A4159 Other Gram-negative sepsis: Secondary | ICD-10-CM | POA: Diagnosis not present

## 2018-10-20 LAB — RENAL FUNCTION PANEL
Albumin: 1.6 g/dL — ABNORMAL LOW (ref 3.5–5.0)
Anion gap: 10 (ref 5–15)
BUN: 75 mg/dL — ABNORMAL HIGH (ref 8–23)
CO2: 23 mmol/L (ref 22–32)
Calcium: 7.8 mg/dL — ABNORMAL LOW (ref 8.9–10.3)
Chloride: 102 mmol/L (ref 98–111)
Creatinine, Ser: 5.55 mg/dL — ABNORMAL HIGH (ref 0.44–1.00)
GFR calc Af Amer: 9 mL/min — ABNORMAL LOW (ref >60)
GFR calc non Af Amer: 8 mL/min — ABNORMAL LOW (ref >60)
Glucose, Bld: 87 mg/dL (ref 70–99)
Phosphorus: 6.7 mg/dL — ABNORMAL HIGH (ref 2.5–4.6)
Potassium: 4.8 mmol/L (ref 3.5–5.1)
Sodium: 135 mmol/L (ref 135–145)

## 2018-10-20 LAB — GLUCOSE, CAPILLARY
Glucose-Capillary: 90 mg/dL (ref 70–99)
Glucose-Capillary: 171 mg/dL — ABNORMAL HIGH (ref 70–99)
Glucose-Capillary: 155 mg/dL — ABNORMAL HIGH (ref 70–99)

## 2018-10-20 NOTE — Progress Notes (Signed)
PROGRESS NOTE    DALICIA KISNER  ZOX:[id number] DOB: [birth year] DOA: 10/06/2018 PCP: Elby Showers, MD   Brief Narrative:  63 year old WF PMHX DM type II uncontrolled with complication,, HTN, murmur, prior gluteal abscess, HLD, hypothyroidism, morbid obesity,  Presenting with confusion since Sunday ~3 days. Initially got better with PO fluids but got worse last night into day of admission. Admission labs notable for +UA, actidosis, acute kidney injury, and lactic acidemia. Lactate and mental status did not improve with fluids and abx in ER so intensivist asked to admit.     Subjective: 6/30 A/O x4, negative S OB, negative abdominal pain, negative N/V    Assessment & Plan:   Principal Problem:   Severe sepsis (HCC) Active Problems:   Benign essential HTN   Hypercholesterolemia without hypertriglyceridemia   Adult hypothyroidism   Diabetes mellitus type 2, uncontrolled (HCC)   HLD (hyperlipidemia)  Severe sepsis/MODS -Multifactorial bacteremia, acute pyelonephritis.  Treat underlying cause,  Bacteremia positive ENTEROBACTER CLOACAE -Antibiotic changed to ciprofloxacin secondary to continued renal failure.  After discussion with pharmacy was determined that antibiotic would be good for bacteremia as well as acute pyelonephritis. - Completed 14-day course of antibiotics; end date 6/30  Acute respiratory failure with hypoxia -Resolved, now on room air  Essential HTN -Amlodipine 10 mg daily -Clonidine 0.2 mg twice daily - Hydralazine PRN -6/24 metoprolol 12.5 mg twice daily - Patient's BP continues to be fairly well controlled on current meds, until final decision is made on starting HD will not add any additional BP medication.    Cardiac murmur - Echocardiogram hyperdynamic EF > 65 see results below.    Acute pyelonephritis positive ENTEROBACTER CLOACAE -See bacteremia -6/27 discussed case with nephrology Dr. Harrie Jeans and agreed that risks outweighed the gains of a  kidney biopsy therefore will not obtain one.  . -ANCA <3.5 -ANA negative -SPEP: Negative evidence of monoclonal protein.  Negative M spike -Free light chains pending  Kappa free light chains elevated 178.9  Lambda free light chain elevated 129.9  Kappa lambda light WNL - Strict in and out +7 5 L -6/30 discussed case Dr Crist Fat nephrology who stated he would like to wait another 24- 48 hours until  he made final decision on starting hemodialysis outpatient.  AKI/proteinuria/hematuria -Secondary to pyelonephritis, uncontrolled diabetes and hypertension may also have played a part.  In addition awaiting results of labs. - 6/26 Lasix 60 mg x 1 given per nephrology. Recent Labs  Lab 10/18/18 0704 10/18/18 1207 10/19/18 0649 10/19/18 1528 10/20/18 0159  CREATININE 4.94* 4.95* 5.36* 5.44* 5.55*  - 6/30 still awaiting nephrology decision on when/if HD to start  Hyperkalemia - Resolved continue to monitor  Diabetes type 2 uncontrolled with complication - 8/11 hemoglobin A1c= 14 - 6/29 increase Levemir 14 units BID - 6/28 increase NovoLog 8 units q. AC  - Severe SSI  HLD - LDL not within ADA/AHA guidelines - LDL goal<70 - 6/25 start Lipitor 40 mg daily  Anemia of chronic disease Recent Labs  Lab 10/15/18 0316 10/16/18 0429 10/17/18 0701 10/18/18 0704 10/19/18 0649  HGB 8.7* 8.6* 9.0* 8.1* 7.5*  -Stable -Anemia panel most consistent with anemia of chronic disease -Occult blood pending - Still negative signs of acute bleeding.  Today appears patient's hemoglobin trending down (lab error?).  If patient's hemoglobin trends down again tomorrow will transfuse her.  Thrombocytopenia -Resolved  Anxiety -Currently not on Xanax while in hospital and and doing well.  Would not restart, would  start SSRI if patient requires long-term medication.  If patient requires short-term medication would start diazepam or Ativan.  Morbid obesity - Diabetic nutritionist has seen patient  -Body mass index is 35.16 kg/m.    DVT prophylaxis: SCD Code Status: Full Family Communication: None Disposition Plan: TBD   Consultants:  Nephrology PCCM  Procedures/Significant Events:  6/24 echocardiogram:Left Ventricle:- hyperdynamic systolic function.  LVEF>65%. -asymmetric left ventricular hypertrophy.  -Left ventricular diastolic Doppler parameters are consistent with pseudonormalization.      I have personally reviewed and interpreted all radiology studies and my findings are as above.  VENTILATOR SETTINGS:    Cultures 6/16 SARS coronavirus negative 6/16 blood left antecubital positive ENTEROBACTER CLOACAE 6/16 blood left wrist positive ENTEROBACTER CLOACAE 6/16 urine positive ENTEROBACTER CLOACAE 6/17 HIV negative 6/24 blood pending    Antimicrobials: Anti-infectives (From admission, onward)   Start     Stop   10/15/18 1000  ciprofloxacin (CIPRO) tablet 500 mg     10/20/18 0959   10/13/18 1230  ceFEPIme (MAXIPIME) 1 g in sodium chloride 0.9 % 100 mL IVPB  Status:  Discontinued     10/14/18 1307   10/09/18 0930  ceFEPIme (MAXIPIME) 2 g in sodium chloride 0.9 % 100 mL IVPB  Status:  Discontinued     10/12/18 1106   10/09/18 0745  cefTRIAXone (ROCEPHIN) 2 g in sodium chloride 0.9 % 100 mL IVPB  Status:  Discontinued     10/09/18 0744   10/07/18 1000  ceFEPIme (MAXIPIME) 2 g in sodium chloride 0.9 % 100 mL IVPB  Status:  Discontinued     10/06/18 1330   10/07/18 0900  ceFEPIme (MAXIPIME) 2 g in sodium chloride 0.9 % 100 mL IVPB  Status:  Discontinued     10/09/18 0739   10/07/18 0800  cefTRIAXone (ROCEPHIN) 2 g in sodium chloride 0.9 % 100 mL IVPB  Status:  Discontinued     10/07/18 0326   10/06/18 1345  cefTRIAXone (ROCEPHIN) 2 g in sodium chloride 0.9 % 100 mL IVPB  Status:  Discontinued     10/06/18 1509   10/06/18 1051  vancomycin variable dose per unstable renal function (pharmacist dosing)  Status:  Discontinued     10/06/18 1334   10/06/18 0900   ceFEPIme (MAXIPIME) 2 g in sodium chloride 0.9 % 100 mL IVPB     10/06/18 0954   10/06/18 0900  metroNIDAZOLE (FLAGYL) IVPB 500 mg     10/06/18 1028   10/06/18 0900  vancomycin (VANCOCIN) IVPB 1000 mg/200 mL premix  Status:  Discontinued     10/06/18 0849   10/06/18 0900  vancomycin (VANCOCIN) 1,500 mg in sodium chloride 0.9 % 500 mL IVPB     10/06/18 1233      Devices    LINES / TUBES:      Continuous Infusions:    Objective: Vitals:   10/19/18 2339 10/20/18 0353 10/20/18 0500 10/20/18 0716  BP: (!) 143/66 137/68  137/63  Pulse: 90 85  88  Resp: (!) 25 (!) 27  18  Temp: (!) 97.5 F (36.4 C) 97.8 F (36.6 C)  98.4 F (36.9 C)  TempSrc: Oral Oral  Oral  SpO2: 97% 93%  96%  Weight:   86.5 kg   Height:        Intake/Output Summary (Last 24 hours) at 10/20/2018 0816 Last data filed at 10/19/2018 1146 Gross per 24 hour  Intake -  Output 350 ml  Net -350 ml   [employer]  10/18/18 0500 10/19/18 0300 10/20/18 0500  Weight: 85.4 kg 85.6 kg 86.5 kg   Physical Exam:  General: A/O x4 no acute respiratory distress Eyes: negative scleral hemorrhage, negative anisocoria, negative icterus ENT: Negative Runny nose, negative gingival bleeding, Neck:  Negative scars, masses, torticollis, lymphadenopathy, JVD Lungs: Clear to auscultation bilaterally without wheezes or crackles Cardiovascular: Regular rate and rhythm without murmur gallop or rub normal S1 and S2 Abdomen: Morbidly obese, negative abdominal pain, nondistended, positive soft, bowel sounds, no rebound, no ascites, no appreciable mass Extremities: No significant cyanosis, clubbing, or edema bilateral lower extremities Skin: Negative rashes, lesions, ulcers Psychiatric:  Negative depression, negative anxiety, negative fatigue, negative mania  Central nervous system:  Cranial nerves II through XII intact, tongue/uvula midline, all extremities muscle strength 5/5, sensation intact throughout,  negative dysarthria,  negative expressive aphasia, negative receptive aphasia.    Data Reviewed: Care during the described time interval was provided by me .  I have reviewed this patient's available data, including medical history, events of note, physical examination, and all test results as part of my evaluation.   CBC: Recent Labs  Lab 10/14/18 0212 10/15/18 0316 10/16/18 0429 10/17/18 0701 10/18/18 0704 10/19/18 0649  WBC 13.0* 14.7* 14.9* 13.8* 11.9* 12.2*  NEUTROABS 10.2*  --   --   --   --   --   HGB 8.4* 8.7* 8.6* 9.0* 8.1* 7.5*  HCT 25.2* 26.2* 26.1* 28.2* 26.3* 23.3*  MCV 83.4 84.5 85.6 86.0 89.2 86.3  PLT 245 276 311 350 327 956   Basic Metabolic Panel: Recent Labs  Lab 10/15/18 0316 10/16/18 0317 10/17/18 0701 10/18/18 0704 10/18/18 1207 10/19/18 0649 10/19/18 1528 10/20/18 0159  NA 131* 132* 133* 134* 133* 134* 133* 135  K 4.4 4.7 4.7 5.7* 4.7 5.1 4.8 4.8  CL 105 107 100 104 102 102 100 102  CO2 17* 14* 18* 17* 22 22 22 23   GLUCOSE 149* 138* 148* 169* 174* 146* 183* 87  BUN 78* 78* 75* 78* 75* 76* 75* 75*  CREATININE 4.18* 4.29* 4.63* 4.94* 4.95* 5.36* 5.44* 5.55*  CALCIUM 7.5* 7.4* 7.7* 7.4* 7.7* 7.8* 7.8* 7.8*  MG 2.0 2.0 2.0 1.9  --  2.0 1.9  --   PHOS 4.9* 5.3*  --  6.2*  --  6.7*  --  6.7*   GFR: Estimated Creatinine Clearance: 10.2 mL/min (A) (by C-G formula based on SCr of 5.55 mg/dL (H)). Liver Function Tests: Recent Labs  Lab 10/14/18 0212 10/15/18 0316 10/16/18 0317 10/18/18 0704 10/19/18 0649 10/20/18 0159  AST 17  --   --   --   --   --   ALT 20  --   --   --   --   --   ALKPHOS 233*  --   --   --   --   --   BILITOT 1.2  --   --   --   --   --   PROT 5.1*  --   --   --   --   --   ALBUMIN 1.4*  1.4* 1.4* 1.4* 1.5* 1.6* 1.6*   No results for input(s): LIPASE, AMYLASE in the last 168 hours. No results for input(s): AMMONIA in the last 168 hours. Coagulation Profile: No results for input(s): INR, PROTIME in the last 168 hours. Cardiac Enzymes: No  results for input(s): CKTOTAL, CKMB, CKMBINDEX, TROPONINI in the last 168 hours. BNP (last 3 results) No results for input(s): PROBNP in the  last 8760 hours. HbA1C: No results for input(s): HGBA1C in the last 72 hours. CBG: Recent Labs  Lab 10/19/18 0627 10/19/18 1103 10/19/18 1537 10/19/18 2051 10/20/18 0605  GLUCAP 146* 126* 179* 114* 90   Lipid Profile: No results for input(s): CHOL, HDL, LDLCALC, TRIG, CHOLHDL, LDLDIRECT in the last 72 hours. Thyroid Function Tests: No results for input(s): TSH, T4TOTAL, FREET4, T3FREE, THYROIDAB in the last 72 hours. Anemia Panel: No results for input(s): VITAMINB12, FOLATE, FERRITIN, TIBC, IRON, RETICCTPCT in the last 72 hours. Urine analysis:    Component Value Date/Time   COLORURINE YELLOW 10/18/2018 0958   APPEARANCEUR HAZY (A) 10/18/2018 0958   LABSPEC 1.009 10/18/2018 0958   PHURINE 6.0 10/18/2018 0958   GLUCOSEU 50 (A) 10/18/2018 0958   HGBUR MODERATE (A) 10/18/2018 0958   BILIRUBINUR NEGATIVE 10/18/2018 0958   BILIRUBINUR NEG 07/03/2017 1527   KETONESUR NEGATIVE 10/18/2018 0958   PROTEINUR 100 (A) 10/18/2018 0958   UROBILINOGEN 0.2 07/03/2017 1527   NITRITE NEGATIVE 10/18/2018 0958   LEUKOCYTESUR LARGE (A) 10/18/2018 0958   Sepsis Labs: @LABRCNTIP (procalcitonin:4,lacticidven:4)  ) Recent Results (from the past 240 hour(s))  Culture, blood (routine x 2)     Status: None   Collection Time: 10/14/18  9:00 AM   Specimen: BLOOD  Result Value Ref Range Status   Specimen Description BLOOD RIGHT ANTECUBITAL  Final   Special Requests   Final    BOTTLES DRAWN AEROBIC ONLY Blood Culture results may not be optimal due to an inadequate volume of blood received in culture bottles   Culture   Final    NO GROWTH 5 DAYS Performed at [facility] [address], [place], [place] [address]    Report Status 10/19/2018 FINAL  Final  Culture, blood (routine x 2)     Status: None   Collection Time: 10/14/18  9:08 AM   Specimen:  BLOOD RIGHT HAND  Result Value Ref Range Status   Specimen Description BLOOD RIGHT HAND  Final   Special Requests   Final    BOTTLES DRAWN AEROBIC ONLY Blood Culture adequate volume   Culture   Final    NO GROWTH 5 DAYS Performed at [facility], [place] [address], [place], [place] [address]    Report Status 10/19/2018 FINAL  Final         Radiology Studies: No results found.      Scheduled Meds: . amLODipine  10 mg Oral Daily  . atorvastatin  40 mg Oral q1800  . cloNIDine  0.2 mg Oral BID  . ferrous sulfate  325 mg Oral Q breakfast  . insulin aspart  0-9 Units Subcutaneous TID WC  . insulin aspart  8 Units Subcutaneous TID WC  . insulin detemir  14 Units Subcutaneous BID  . metoprolol tartrate  12.5 mg Oral BID  . polyethylene glycol  17 g Oral Daily  . sodium bicarbonate  1,300 mg Oral BID   Continuous Infusions:    LOS: 14 days   The patient is critically ill with multiple organ systems failure and requires high complexity decision making for assessment and support, frequent evaluation and titration of therapies, application of advanced monitoring technologies and extensive interpretation of multiple databases. Critical Care Time devoted to patient care services described in this note  Time spent: 40 minutes     Anzel Kearse, Geraldo Docker, MD [facility] Pager [phone]  If 7PM-7AM, please contact night-coverage [email or web address] Password [email or web address] 10/20/2018, 8:16 AM

## 2018-10-20 NOTE — Progress Notes (Signed)
[facility] NEPHROLOGY PROGRESS NOTE  Assessment/ Plan: Pt is a 63 y.o. yo female with history of hypertension, diabetes, hypothyroid presents presented with sepsis due to pyelonephritis consulted for AKI.  #Acute kidney injury, nonoliguric likely due to sepsis and pyelonephritis concomitant with the hemodynamic change related with ARB/HCTZ: -Patient has poorly controlled diabetes with hba1c>14 therefore she probably has underlying CKD.  UA with 3.2 g proteinuria.  Other serology including ANA, ANCA, kappa lamda ratio, complements negative.  Renal US with no obstruction. -The rate of rise in creatinine slow down and patient is asymptomatic with no uremic features.  Hopefully the creatinine will plateau so that she will have renal recovery.  On discussion with the patient, she is willing to wait a few more days before initiating RRT.  The urine output noted marginal today.  Strict ins and outs, monitor BMP.  Can resume diet.  -continue sod bicarbonate.  #Proteinuria probably due to diabetic nephropathy.  Needs glycemic control.   #Pyelonephritis: On Cipro per primary team.  #Hypertension: Monitor blood pressure.  Continue current medication.  #Enterobacter bacteremia: On Cipro for 14 days.  #Hyperkalemia: K 4.8 today. Monitor electrolytes.  Subjective: Seen and examined at bedside. Reports feeling great.  Denied nausea, vomiting, loss of appetite, weakness, dysgeusia, chest pain or shortness of breath.  Objective Vital signs in last 24 hours: Vitals:   10/19/18 2339 10/20/18 0353 10/20/18 0500 10/20/18 0716  BP: (!) 143/66 137/68  137/63  Pulse: 90 85  88  Resp: (!) 25 (!) 27  18  Temp: (!) 97.5 F (36.4 C) 97.8 F (36.6 C)  98.4 F (36.9 C)  TempSrc: Oral Oral  Oral  SpO2: 97% 93%  96%  Weight:   86.5 kg   Height:       Weight change: 0.9 kg  Intake/Output Summary (Last 24 hours) at 10/20/2018 0758 Last data filed at 10/19/2018 1146 Gross per 24 hour  Intake -   Output 350 ml  Net -350 ml       Labs: Basic Metabolic Panel: Recent Labs  Lab 10/18/18 0704  10/19/18 0649 10/19/18 1528 10/20/18 0159  NA 134*   < > 134* 133* 135  K 5.7*   < > 5.1 4.8 4.8  CL 104   < > 102 100 102  CO2 17*   < > 22 22 23   GLUCOSE 169*   < > 146* 183* 87  BUN 78*   < > 76* 75* 75*  CREATININE 4.94*   < > 5.36* 5.44* 5.55*  CALCIUM 7.4*   < > 7.8* 7.8* 7.8*  PHOS 6.2*  --  6.7*  --  6.7*   < > = values in this interval not displayed.   Liver Function Tests: Recent Labs  Lab 10/14/18 0212  10/18/18 0704 10/19/18 0649 10/20/18 0159  AST 17  --   --   --   --   ALT 20  --   --   --   --   ALKPHOS 233*  --   --   --   --   BILITOT 1.2  --   --   --   --   PROT 5.1*  --   --   --   --   ALBUMIN 1.4*  1.4*   < > 1.5* 1.6* 1.6*   < > = values in this interval not displayed.   No results for input(s): LIPASE, AMYLASE in the last 168 hours. No results for input(s):  AMMONIA in the last 168 hours. CBC: Recent Labs  Lab 10/14/18 0212 10/15/18 0316 10/16/18 0429 10/17/18 0701 10/18/18 0704 10/19/18 0649  WBC 13.0* 14.7* 14.9* 13.8* 11.9* 12.2*  NEUTROABS 10.2*  --   --   --   --   --   HGB 8.4* 8.7* 8.6* 9.0* 8.1* 7.5*  HCT 25.2* 26.2* 26.1* 28.2* 26.3* 23.3*  MCV 83.4 84.5 85.6 86.0 89.2 86.3  PLT 245 276 311 350 327 385   Cardiac Enzymes: No results for input(s): CKTOTAL, CKMB, CKMBINDEX, TROPONINI in the last 168 hours. CBG: Recent Labs  Lab 10/19/18 0627 10/19/18 1103 10/19/18 1537 10/19/18 2051 10/20/18 0605  GLUCAP 146* 126* 179* 114* 90    Iron Studies: No results for input(s): IRON, TIBC, TRANSFERRIN, FERRITIN in the last 72 hours. Studies/Results: No results found.  Medications: Infusions:   Scheduled Medications: . amLODipine  10 mg Oral Daily  . atorvastatin  40 mg Oral q1800  . cloNIDine  0.2 mg Oral BID  . ferrous sulfate  325 mg Oral Q breakfast  . insulin aspart  0-9 Units Subcutaneous TID WC  . insulin  aspart  8 Units Subcutaneous TID WC  . insulin detemir  14 Units Subcutaneous BID  . metoprolol tartrate  12.5 mg Oral BID  . polyethylene glycol  17 g Oral Daily  . sodium bicarbonate  1,300 mg Oral BID    have reviewed scheduled and prn medications.  Physical Exam: General:NAD, comfortable Heart:RRR, s1s2 nl, no rubs Lungs:clear b/l, no crackle Abdomen:soft, Non-tender, non-distended Extremities: LE edema + Neurologic: Alert, awake, following commands.    Allison Patel Allison Patel 10/20/2018,7:58 AM  LOS: 14 days  Pager: [phone]

## 2018-10-20 NOTE — Progress Notes (Signed)
Report called to [place], RN for transfer to [phone].  Call placed to husband re: transfer and given phone numbers to patient's new room and to [facility] nurses station.  Patient transferred w/all patient belongings.

## 2018-10-20 NOTE — Progress Notes (Signed)
Occupational Therapy Treatment Patient Details Name: Allison Patel MRN: [id number] DOB: 25-Nov-1955 Today's Date: 10/20/2018    History of present illness 63 year old woman with DM, HTN, prior gluteal abscess presenting with confusion since Sunday ~3 days. hx DM, HTN p/w severe sepsis secondary to enterobacter pyelnonephritis with hematogenous spread.   OT comments  Pt progressing.  Engaged in ADL training today with focus on LB self care.  Requires mod assist for LB dressing, reviewed compensatory techniques and strategies for LB dressing--pt reports not wearing socks at home, but slip on shoes and agreeable to elastic waist band pants for increased ease.  Will benefit from AE training next session.  Toileting and toilet transfer with min guard to close supervision.  Noted RR up to 31 during ADLs, recovering quickly with PLB and rest breaks, and oxygen maintained throughout session on RA.  Will follow acutely.    Follow Up Recommendations  Home health OT;Supervision/Assistance - 24 hour    Equipment Recommendations  3 in 1 bedside commode    Recommendations for Other Services      Precautions / Restrictions Precautions Precautions: Fall Restrictions Weight Bearing Restrictions: No       Mobility Bed Mobility               General bed mobility comments: OOB in chair upon entry   Transfers Overall transfer level: Needs assistance Equipment used: Rolling walker (2 wheeled) Transfers: Sit to/from Stand Sit to Stand: Min guard;Supervision         General transfer comment: min guard to close supervision--cueing for hand placement     Balance Overall balance assessment: Needs assistance Sitting-balance support: No upper extremity supported;Feet supported Sitting balance-Leahy Scale: Fair     Standing balance support: No upper extremity supported Standing balance-Leahy Scale: Fair Standing balance comment: preference to B UB support dynamically                             ADL either performed or assessed with clinical judgement   ADL Overall ADL's : Needs assistance/impaired                     Lower Body Dressing: Moderate assistance;Sit to/from stand Lower Body Dressing Details (indicate cue type and reason): pt requires mod assist to don/doff socks, reviewed compensatory techniques with clothing options and techniques, min guard sit to stand- will benefit from AE education  Toilet Transfer: Min Engineer, site Details (indicate cue type and reason): min guard for safety, cueing for hand placement Toileting- Clothing Manipulation and Hygiene: Min guard;Sit to/from stand       Functional mobility during ADLs: Passenger transport manager     Praxis      Cognition Arousal/Alertness: Awake/alert Behavior During Therapy: WFL for tasks assessed/performed Overall Cognitive Status: Impaired/Different from baseline Area of Impairment: Problem solving                             Problem Solving: Decreased initiation General Comments: pt with decreased initation and problem sovling when engaging in LB ADLs         Exercises     Shoulder Instructions       General Comments VSS- cueing for pursed lip breathing with RR upto 31 during ADLs but oxygen saturations maintained >90%  Pertinent Vitals/ Pain       Pain Assessment: No/denies pain  Home Living                                          Prior Functioning/Environment              Frequency  Min 2X/week        Progress Toward Goals  OT Goals(current goals can now be found in the care plan section)  Progress towards OT goals: Progressing toward goals  Acute Rehab OT Goals Patient Stated Goal: to go home OT Goal Formulation: With patient  Plan Discharge plan remains appropriate;Frequency remains appropriate    Co-evaluation                 AM-PAC OT "6  Clicks" Daily Activity     Outcome Measure   Help from another person eating meals?: None Help from another person taking care of personal grooming?: A Lot Help from another person toileting, which includes using toliet, bedpan, or urinal?: A Little Help from another person bathing (including washing, rinsing, drying)?: A Little Help from another person to put on and taking off regular upper body clothing?: A Little Help from another person to put on and taking off regular lower body clothing?: A Lot 6 Click Score: 17    End of Session Equipment Utilized During Treatment: Rolling walker  OT Visit Diagnosis: Unsteadiness on feet (R26.81);Other symptoms and signs involving cognitive function;Muscle weakness (generalized) (M62.81)   Activity Tolerance Patient tolerated treatment well   Patient Left in chair;with call bell/phone within reach;with chair alarm set   Nurse Communication Mobility status        Time: 3267-1245 OT Time Calculation (min): 21 min  Charges: OT General Charges $OT Visit: 1 Visit OT Treatments $Self Care/Home Management : 8-22 mins  Delight Stare, [place] Pager [phone] Office [phone]    Delight Stare 10/20/2018, 12:17 PM

## 2018-10-21 ENCOUNTER — Inpatient Hospital Stay (HOSPITAL_COMMUNITY): Payer: No Typology Code available for payment source

## 2018-10-21 DIAGNOSIS — N170 Acute kidney failure with tubular necrosis: Secondary | ICD-10-CM

## 2018-10-21 DIAGNOSIS — A4159 Other Gram-negative sepsis: Secondary | ICD-10-CM | POA: Diagnosis not present

## 2018-10-21 HISTORY — PX: IR US GUIDE VASC ACCESS RIGHT: IMG2390

## 2018-10-21 HISTORY — PX: IR FLUORO GUIDE CV LINE RIGHT: IMG2283

## 2018-10-21 LAB — RENAL FUNCTION PANEL
Albumin: 1.7 g/dL — ABNORMAL LOW (ref 3.5–5.0)
Albumin: 1.6 g/dL — ABNORMAL LOW (ref 3.5–5.0)
Anion gap: 14 (ref 5–15)
Anion gap: 13 (ref 5–15)
BUN: 78 mg/dL — ABNORMAL HIGH (ref 8–23)
BUN: 78 mg/dL — ABNORMAL HIGH (ref 8–23)
CO2: 22 mmol/L (ref 22–32)
CO2: 22 mmol/L (ref 22–32)
Calcium: 7.9 mg/dL — ABNORMAL LOW (ref 8.9–10.3)
Calcium: 7.8 mg/dL — ABNORMAL LOW (ref 8.9–10.3)
Chloride: 99 mmol/L (ref 98–111)
Chloride: 98 mmol/L (ref 98–111)
Creatinine, Ser: 5.82 mg/dL — ABNORMAL HIGH (ref 0.44–1.00)
Creatinine, Ser: 5.75 mg/dL — ABNORMAL HIGH (ref 0.44–1.00)
GFR calc Af Amer: 8 mL/min — ABNORMAL LOW (ref >60)
GFR calc Af Amer: 8 mL/min — ABNORMAL LOW (ref >60)
GFR calc non Af Amer: 7 mL/min — ABNORMAL LOW (ref >60)
GFR calc non Af Amer: 7 mL/min — ABNORMAL LOW (ref >60)
Glucose, Bld: 123 mg/dL — ABNORMAL HIGH (ref 70–99)
Glucose, Bld: 127 mg/dL — ABNORMAL HIGH (ref 70–99)
Phosphorus: 7.1 mg/dL — ABNORMAL HIGH (ref 2.5–4.6)
Phosphorus: 7.6 mg/dL — ABNORMAL HIGH (ref 2.5–4.6)
Potassium: 4.8 mmol/L (ref 3.5–5.1)
Potassium: 5 mmol/L (ref 3.5–5.1)
Sodium: 135 mmol/L (ref 135–145)
Sodium: 133 mmol/L — ABNORMAL LOW (ref 135–145)

## 2018-10-21 LAB — CBC
HCT: 25.4 % — ABNORMAL LOW (ref 36.0–46.0)
HCT: 23.6 % — ABNORMAL LOW (ref 36.0–46.0)
Hemoglobin: 8 g/dL — ABNORMAL LOW (ref 12.0–15.0)
Hemoglobin: 7.5 g/dL — ABNORMAL LOW (ref 12.0–15.0)
MCH: 27 pg (ref 26.0–34.0)
MCH: 27.5 pg (ref 26.0–34.0)
MCHC: 31.5 g/dL (ref 30.0–36.0)
MCHC: 31.8 g/dL (ref 30.0–36.0)
MCV: 85.8 fL (ref 80.0–100.0)
MCV: 86.4 fL (ref 80.0–100.0)
Platelets: 395 10*3/uL (ref 150–400)
Platelets: 383 10*3/uL (ref 150–400)
RBC: 2.96 MIL/uL — ABNORMAL LOW (ref 3.87–5.11)
RBC: 2.73 MIL/uL — ABNORMAL LOW (ref 3.87–5.11)
RDW: 14.6 % (ref 11.5–15.5)
RDW: 14.4 % (ref 11.5–15.5)
WBC: 12.1 10*3/uL — ABNORMAL HIGH (ref 4.0–10.5)
WBC: 10.5 10*3/uL (ref 4.0–10.5)
nRBC: 0 % (ref 0.0–0.2)
nRBC: 0 % (ref 0.0–0.2)

## 2018-10-21 LAB — GLUCOSE, CAPILLARY
Glucose-Capillary: 117 mg/dL — ABNORMAL HIGH (ref 70–99)
Glucose-Capillary: 118 mg/dL — ABNORMAL HIGH (ref 70–99)
Glucose-Capillary: 113 mg/dL — ABNORMAL HIGH (ref 70–99)
Glucose-Capillary: 78 mg/dL (ref 70–99)

## 2018-10-21 LAB — MAGNESIUM: Magnesium: 2 mg/dL (ref 1.7–2.4)

## 2018-10-21 LAB — PROTIME-INR
INR: 1.2 (ref 0.8–1.2)
Prothrombin Time: 15 seconds (ref 11.4–15.2)

## 2018-10-21 MED ORDER — LIDOCAINE HCL 1 % IJ SOLN
INTRAMUSCULAR | Status: AC | PRN
  Administered 2018-10-21: 10 mL

## 2018-10-21 MED ORDER — CHLORHEXIDINE GLUCONATE CLOTH 2 % EX PADS
6.0000 | MEDICATED_PAD | Freq: Every day | CUTANEOUS | Status: DC
  Administered 2018-10-22: 6 via TOPICAL

## 2018-10-21 MED ORDER — CEFAZOLIN SODIUM-DEXTROSE 2-4 GM/100ML-% IV SOLN
2.0000 g | INTRAVENOUS | Status: AC
  Administered 2018-10-21: 2 g via INTRAVENOUS
  Filled 2018-10-21: qty 100

## 2018-10-21 MED ORDER — PENTAFLUOROPROP-TETRAFLUOROETH EX AERO: 1.0000 "application " | INHALATION_SPRAY | CUTANEOUS | Status: DC | PRN

## 2018-10-21 MED ORDER — HEPARIN SODIUM (PORCINE) 1000 UNIT/ML IJ SOLN
INTRAMUSCULAR | Status: AC
  Filled 2018-10-21: qty 1

## 2018-10-21 MED ORDER — SODIUM CHLORIDE 0.9 % IV SOLN: 100.0000 mL | INTRAVENOUS | Status: DC | PRN

## 2018-10-21 MED ORDER — HEPARIN SODIUM (PORCINE) 1000 UNIT/ML IJ SOLN
INTRAMUSCULAR | Status: AC
  Administered 2018-10-22: 1000 [IU] via INTRAVENOUS_CENTRAL
  Filled 2018-10-21: qty 4

## 2018-10-21 MED ORDER — FENTANYL CITRATE (PF) 100 MCG/2ML IJ SOLN
INTRAMUSCULAR | Status: AC
  Filled 2018-10-21: qty 2

## 2018-10-21 MED ORDER — HEPARIN SODIUM (PORCINE) 1000 UNIT/ML DIALYSIS: 20.0000 [IU]/kg | INTRAMUSCULAR | Status: DC | PRN

## 2018-10-21 MED ORDER — CEFAZOLIN SODIUM-DEXTROSE 2-4 GM/100ML-% IV SOLN
INTRAVENOUS | Status: AC
  Filled 2018-10-21: qty 100

## 2018-10-21 MED ORDER — LIDOCAINE HCL 1 % IJ SOLN
INTRAMUSCULAR | Status: AC
  Filled 2018-10-21: qty 20

## 2018-10-21 MED ORDER — ALTEPLASE 2 MG IJ SOLR: 2.0000 mg | Freq: Once | INTRAMUSCULAR | Status: DC | PRN

## 2018-10-21 MED ORDER — HEPARIN SODIUM (PORCINE) 5000 UNIT/ML IJ SOLN
5000.0000 [IU] | Freq: Three times a day (TID) | INTRAMUSCULAR | Status: DC
  Administered 2018-10-22 – 2018-10-23 (×4): 5000 [IU] via SUBCUTANEOUS
  Filled 2018-10-21 (×4): qty 1

## 2018-10-21 MED ORDER — LIDOCAINE-PRILOCAINE 2.5-2.5 % EX CREA: 1.0000 "application " | TOPICAL_CREAM | CUTANEOUS | Status: DC | PRN

## 2018-10-21 MED ORDER — HEPARIN SODIUM (PORCINE) 1000 UNIT/ML DIALYSIS: 1000.0000 [IU] | INTRAMUSCULAR | Status: DC | PRN

## 2018-10-21 MED ORDER — FENTANYL CITRATE (PF) 100 MCG/2ML IJ SOLN
INTRAMUSCULAR | Status: AC | PRN
  Administered 2018-10-21: 25 ug via INTRAVENOUS

## 2018-10-21 MED ORDER — LIDOCAINE HCL (PF) 1 % IJ SOLN: 5.0000 mL | INTRAMUSCULAR | Status: DC | PRN

## 2018-10-21 MED ORDER — INSULIN DETEMIR 100 UNIT/ML ~~LOC~~ SOLN
14.0000 [IU] | Freq: Every day | SUBCUTANEOUS | Status: DC
  Administered 2018-10-22 – 2018-10-23 (×2): 14 [IU] via SUBCUTANEOUS
  Filled 2018-10-21 (×2): qty 0.14

## 2018-10-21 MED ORDER — MIDAZOLAM HCL 2 MG/2ML IJ SOLN
INTRAMUSCULAR | Status: AC
  Filled 2018-10-21: qty 2

## 2018-10-21 MED ORDER — MIDAZOLAM HCL 2 MG/2ML IJ SOLN
INTRAMUSCULAR | Status: AC | PRN
  Administered 2018-10-21: 1 mg via INTRAVENOUS

## 2018-10-21 NOTE — Progress Notes (Signed)
   10/21/18 1701  Vital Signs  Pulse Rate (!) 101  Resp (!) 21  BP (!) 173/74  Oxygen Therapy  SpO2 (!) 84 %  O2 Device Room Air (placed patient on nasal canula)   Patient oxygen 84% on room before beginning procedure. Patient placed on 6L [place]. Denies shortness of breath or difficulty breathing. Saturations improved to 93% on 6L

## 2018-10-21 NOTE — Progress Notes (Signed)
  Report called to [facility], RN. RN informed that patient is now on 6L [place], oxygen saturations 93. Patient alert/oriented and denies shortness of breath or difficulty breathing.

## 2018-10-21 NOTE — Procedures (Signed)
  Procedure: R IJ tunneled HD catheter Palindrome 19 EBL:   minimal Complications:  none immediate  See full dictation in [facility].  Dillard Cannon MD Main # [phone] Pager  [phone]

## 2018-10-21 NOTE — Progress Notes (Signed)
PROGRESS NOTE    Allison Patel  JTT:[id number] DOB: Feb 29, 1956 DOA: 10/06/2018 PCP: Elby Showers, MD    Brief Narrative:  63 year old female who presented with confusion.  She does have significant past medical history for type II diabetes mellitus, and hypertension.  Reported 3 days of confusion, unable to give detailed history due to acute cognitive impairment.  On her initial physical examination blood pressure was 136/67, heart rate 137, temperature 39.4 C, respiratory rate 47, oxygen saturation 97%, her lungs were clear to auscultation, heart S1-S2 present, tachycardic, abdomen was soft and nontender, no lower extremity edema, she was slow to response, positive confusion and disorientation, unable to finish sentences.  Sodium 129, potassium 4.2, chloride 99, bicarb 15, glucose 389, BUN 53, creatinine 2.89, calcium 7.4, anion gap 15, AST 67, ALT 87, total  bilirubin is 1.9, white count 10.3, hemoglobin 13.3, hematocrit 39.1, platelets 65, SARS COVID-19 was negative.  Urine analysis more than 500 glucose, more than 300 protein, specific gravity 1.015, 21-50 red cells, more than 50 white cells, positive granular casts.  Head CT negative for acute changes, abdominal CT with right kidney edematous with stranding about the kidney and ureter.  No ureteral stone on the right.  Left 0.4 cm nonobstructing stone.  Chest radiograph with hyperinflation, cardiomegaly, mild hilar vascular congestion.  135 bpm, right axis deviation, SVT rhythm, right bundle branch block, no ST segment or T wave changes, positive PVC.  Patient was admitted to the hospital working diagnosis of sepsis due to right pyelonephritis complicated by acute kidney injury and SVT.   Patient had a prolonged hospital stay, cultures tested positive for Enterobacter cloaca, responded well to antibiotic therapy.  Unfortunately kidney function continued to deteriorate, she has been started on hemodialysis.  Assessment & Plan:   Principal  Problem:   Severe sepsis ([place]) Active Problems:   Benign essential HTN   Hypercholesterolemia without hypertriglyceridemia   Adult hypothyroidism   Diabetes mellitus type 2, uncontrolled (HCC)   HLD (hyperlipidemia)   1. Sepsis due to right pyelonephritis/ enterobacter bacteremia, present on admission. WBC today at 12.1, follow up blood cultures from 06/24 with no growth. Patient has completed antibiotic therapy in the hospital.   2. Non oliguric AKI due to ATN. Worsening renal function per serum cr, today up to 5,82, BUN 78, K at 4,8 and serum bicarbonate at 22. Urine output 400 cc over last 24 H. Positive lower extremity edema, but no clinical signs of pulmonary edema. No meaningful signs of renal recovery, patient will had HD catheter place today, to start with renal replacement therapy. Will follow renal panel in am.   3. HTN. Stable blood pressure with systolic 009 mmHg, will continue blood pressure monitoring. Continue amlodipine 10 mg day, clonidine 02 mg bid, and metoprolol 12,5 mg po bid.   4. T2DM. Fasting glucose this am 123, capillary glucose 90, 171, 155, 117, 118, 113, continue glucose cover and monitoring with insulin sliding scale.  Will decrease dose of levimir to 14 units daily from 14 units bid to prevent hypoglycemia, in the setting of worsening renal function.   5. Morbid obesity. Her calculated bmi is 37, will need outpatient follow up.    DVT prophylaxis:  Heparin   Code Status: full Family Communication: no family at the bedside  Disposition Plan/ discharge barriers: pending clinical improvement.   Body mass index is 37.87 kg/m. Malnutrition Type:  Nutrition Problem: Increased nutrient needs Etiology: acute illness(sepsis, AKI)   Malnutrition Characteristics:  Signs/Symptoms: estimated  needs   Nutrition Interventions:  Interventions: Nepro shake  RN Pressure Injury Documentation:     Consultants:  Nephrology  IR  Procedures:      Antimicrobials:       Subjective: This am patient with no dyspnea, no chest pain, no nausea or vomiting, no confusion or agitation. Continue to have lower extremity edema.   Objective: Vitals:   10/20/18 1643 10/20/18 2113 10/21/18 0519 10/21/18 0858  BP: (!) 152/74 (!) 149/71 (!) 150/85 136/69  Pulse: 100 (!) 106 99 (!) 105  Resp: 18 18 18 19   Temp: 98.2 F (36.8 C) 99 F (37.2 C) 98.8 F (37.1 C) 99 F (37.2 C)  TempSrc: Oral Oral Oral Oral  SpO2: 92% 90% 91% 92%  Weight:  86.5 kg    Height:        Intake/Output Summary (Last 24 hours) at 10/21/2018 1249 Last data filed at 10/21/2018 1031 Gross per 24 hour  Intake 360 ml  Output 800 ml  Net -440 ml   Filed Weights   10/19/18 0300 10/20/18 0500 10/20/18 2113  Weight: 85.6 kg 86.5 kg 86.5 kg    Examination:   General: deconditioned  Neurology: Awake and alert, non focal  E ENT: mild pallor, no icterus, oral mucosa moist Cardiovascular: No JVD. S1-S2 present, rhythmic, no gallops, rubs, right lower parasternal systolic murmur. ++ pitting lower extremity edema. Pulmonary: positive breath sounds bilaterally, adequate air movement, no wheezing, rhonchi or rales. Gastrointestinal. Abdomen protuberant with no organomegaly, non tender, no rebound or guarding Skin. No rashes Musculoskeletal: no joint deformities     Data Reviewed: I have personally reviewed following labs and imaging studies  CBC: Recent Labs  Lab 10/16/18 0429 10/17/18 0701 10/18/18 0704 10/19/18 0649 10/21/18 0535  WBC 14.9* 13.8* 11.9* 12.2* 12.1*  HGB 8.6* 9.0* 8.1* 7.5* 8.0*  HCT 26.1* 28.2* 26.3* 23.3* 25.4*  MCV 85.6 86.0 89.2 86.3 85.8  PLT 311 350 327 385 885   Basic Metabolic Panel: Recent Labs  Lab 10/16/18 0317 10/17/18 0701 10/18/18 0704 10/18/18 1207 10/19/18 0649 10/19/18 1528 10/20/18 0159 10/21/18 0535  NA 132* 133* 134* 133* 134* 133* 135 135  K 4.7 4.7 5.7* 4.7 5.1 4.8 4.8 4.8  CL 107 100 104 102 102 100 102 99   CO2 14* 18* 17* 22 22 22 23 22   GLUCOSE 138* 148* 169* 174* 146* 183* 87 123*  BUN 78* 75* 78* 75* 76* 75* 75* 78*  CREATININE 4.29* 4.63* 4.94* 4.95* 5.36* 5.44* 5.55* 5.82*  CALCIUM 7.4* 7.7* 7.4* 7.7* 7.8* 7.8* 7.8* 7.9*  MG 2.0 2.0 1.9  --  2.0 1.9  --  2.0  PHOS 5.3*  --  6.2*  --  6.7*  --  6.7* 7.1*   GFR: Estimated Creatinine Clearance: 9.7 mL/min (A) (by C-G formula based on SCr of 5.82 mg/dL (H)). Liver Function Tests: Recent Labs  Lab 10/16/18 0317 10/18/18 0704 10/19/18 0649 10/20/18 0159 10/21/18 0535  ALBUMIN 1.4* 1.5* 1.6* 1.6* 1.7*   No results for input(s): LIPASE, AMYLASE in the last 168 hours. No results for input(s): AMMONIA in the last 168 hours. Coagulation Profile: Recent Labs  Lab 10/21/18 1027  INR 1.2   Cardiac Enzymes: No results for input(s): CKTOTAL, CKMB, CKMBINDEX, TROPONINI in the last 168 hours. BNP (last 3 results) No results for input(s): PROBNP in the last 8760 hours. HbA1C: No results for input(s): HGBA1C in the last 72 hours. CBG: Recent Labs  Lab 10/20/18 1044 10/20/18 1641  10/20/18 2320 10/21/18 0656 10/21/18 1115  GLUCAP 171* 155* 117* 118* 113*   Lipid Profile: No results for input(s): CHOL, HDL, LDLCALC, TRIG, CHOLHDL, LDLDIRECT in the last 72 hours. Thyroid Function Tests: No results for input(s): TSH, T4TOTAL, FREET4, T3FREE, THYROIDAB in the last 72 hours. Anemia Panel: No results for input(s): VITAMINB12, FOLATE, FERRITIN, TIBC, IRON, RETICCTPCT in the last 72 hours.    Radiology Studies: I have reviewed all of the imaging during this hospital visit personally     Scheduled Meds: . amLODipine  10 mg Oral Daily  . atorvastatin  40 mg Oral q1800  . Chlorhexidine Gluconate Cloth  6 each Topical Q0600  . cloNIDine  0.2 mg Oral BID  . ferrous sulfate  325 mg Oral Q breakfast  . insulin aspart  0-9 Units Subcutaneous TID WC  . insulin aspart  8 Units Subcutaneous TID WC  . insulin detemir  14 Units  Subcutaneous BID  . metoprolol tartrate  12.5 mg Oral BID  . polyethylene glycol  17 g Oral Daily   Continuous Infusions: .  ceFAZolin (ANCEF) IV       LOS: 15 days        Mauricio Gerome Apley, MD

## 2018-10-21 NOTE — Progress Notes (Signed)
I spoke with her daughter Caryl Pina and all questions were addressed.

## 2018-10-21 NOTE — Progress Notes (Signed)
[facility] for Re-Initiating Anticoagulation Post-IR Procedure Indication: VTE prophylaxis  Allergies  Allergen Reactions  . Macrobid [Nitrofurantoin Macrocrystal] Rash    Patient Measurements: Height: 4' 11.5" (151.1 cm) Weight: 190 lb 11.2 oz (86.5 kg) IBW/kg (Calculated) : 44.35  Vital Signs: Temp: 98.3 F (36.8 C) (07/01 1747) Temp Source: Oral (07/01 1747) BP: 168/76 (07/01 1747) Pulse Rate: 102 (07/01 1747)  Labs: Recent Labs    10/19/18 0649 10/19/18 1528 10/20/18 0159 10/21/18 0535 10/21/18 1027  HGB 7.5*  --   --  8.0*  --   HCT 23.3*  --   --  25.4*  --   PLT 385  --   --  395  --   LABPROT  --   --   --   --  15.0  INR  --   --   --   --  1.2  CREATININE 5.36* 5.44* 5.55* 5.82*  --     Estimated Creatinine Clearance: 9.7 mL/min (A) (by C-G formula based on SCr of 5.82 mg/dL (H)).   Medical History: Past Medical History:  Diagnosis Date  . Diabetes mellitus without complication ([place])   . Hyperlipidemia   . Hypertension   . Hypothyroidism     Assessment: 63 yr old female with non-oliguric AKI due to ATN (likely due to sepsis/pyelonephritis concomitant with ARB/HCTZ), worsening renal function with no meaningful signs of renal recovery; plans to start HD after catheter placement. Pt S/P right IJ tunneled D catheter placement this afternoon (IR note at 5:31 PM). Per consult order, bleeding risk is rate low.  Pt was not receiving VTE prophylaxis prior to procedure. Dr. Cathlean Sauer gave order to start heparin 5000 units SQ Q 8 hrs after procedure.  Hgb: 8, Hct: 25.4, INR: 1.2   Goal of Therapy:  Prevention of VTE Monitor platelets by anticoagulation protocol: Yes   Plan:  Start heparin 5000 units SQ Q 8 hrs at 2200 this evening (>4 hrs after IR procedure, per [facility] protocol). Monitor for signs/symptoms of bleeding.  Gillermina Hu, PharmD, BCPS, [facility] Clinical Pharmacist 10/21/2018,5:59 PM

## 2018-10-21 NOTE — Consult Note (Signed)
Chief Complaint: Patient was seen in consultation today for AKI/tunneled HD catheter placement.  Referring Physician(s): Rosita Fire  Supervising Physician: Arne Cleveland  Patient Status: [facility] - In-pt  History of Present Illness: Allison Patel is a 63 y.o. female with a past medical history of hypertension, hyperlipidemia, diabetes mellitus, and hypothyroidism. She presented to [facility] ED via EMS 10/06/2018 with complains of AMS and fever x 3 days. In ED, she was found to be septic thought to be secondary to UTI/pyelonephritis with associated AKI and lactic acidosis. Nephrology was consulted and following, now recommends IR consultation for possible tunneled HD catheter placement due to lack of improvement of renal functioning and need for HD.  IR consulted by Dr. Carolin Sicks for possible image-guided tunneled HD catheter placement. Patient awake and alert laying in bed with no complaints at this time. Denies fever, chills, chest pain, dyspnea, abdominal pain, or headache.   Past Medical History:  Diagnosis Date   Diabetes mellitus without complication ([place])    Hyperlipidemia    Hypertension    Hypothyroidism     Past Surgical History:  Procedure Laterality Date   ABDOMINAL HYSTERECTOMY     CESAREAN SECTION     2   CHOLECYSTECTOMY      Allergies: Macrobid [nitrofurantoin macrocrystal]  Medications: Prior to Admission medications   Medication Sig Start Date End Date Taking? Authorizing Provider  ALPRAZolam Duanne Moron) 0.5 MG tablet Take 1 tablet (0.5 mg total) by mouth 2 (two) times daily as needed for anxiety. 10/17/17  Yes [name], Cresenciano Lick, MD  amLODipine (NORVASC) 5 MG tablet TAKE ONE TABLET BY MOUTH DAILY Patient taking differently: Take 5 mg by mouth daily.  03/04/18  Yes [name], Cresenciano Lick, MD  cloNIDine (CATAPRES) 0.2 MG tablet TAKE ONE TABLET BY MOUTH TWICE A DAY Patient taking differently: Take 0.2 mg by mouth 2 (two) times daily.  07/31/18  Yes [name], Cresenciano Lick, MD  Insulin Glargine (LANTUS) 100 UNIT/ML Solostar Pen Inject 10 Units into the skin daily at 10 pm. Patient taking differently: Inject 20 Units into the skin daily at 10 pm.  07/07/17  Yes Charlynne Cousins, MD  Insulin Lispro (HUMALOG KWIKPEN [place]) Inject 10 Units into the skin 2 (two) times a day.    Yes [provider]  Insulin Pen Needle 31G X 6 MM MISC 1 Device by Does not apply route 2 (two) times daily. 07/07/17  Yes Charlynne Cousins, MD  olmesartan-hydrochlorothiazide (BENICAR HCT) 40-25 MG tablet TAKE 1 TABLET BY MOUTH DAILY 06/10/18  Yes [name], Cresenciano Lick, MD  SitaGLIPtin-MetFORMIN HCl 50-1000 MG TB24 Take 2 tablets by mouth daily.    Yes [provider]  rosuvastatin (CRESTOR) 40 MG tablet TAKE ONE TABLET BY MOUTH DAILY 10/15/18   Elby Showers, MD     Family History  Problem Relation Age of Onset   Diabetes Mother    Hypertension Mother    Congestive Heart Failure Mother    Hypertension Father    Pneumonia Father    Pancreatic cancer Brother     Social History   Socioeconomic History   Marital status: Married    Spouse name: Not on file   Number of children: Not on file   Years of education: Not on file   Highest education level: Not on file  Occupational History   Not on file  Social Needs   Financial resource strain: Not on file   Food insecurity    Worry: Not on file  Inability: Not on file   Transportation needs    Medical: Not on file    Non-medical: Not on file  Tobacco Use   Smoking status: Never Smoker   Smokeless tobacco: Never Used  Substance and Sexual Activity   Alcohol use: No    Frequency: Never   Drug use: No   Sexual activity: Not on file  Lifestyle   Physical activity    Days per week: Not on file    Minutes per session: Not on file   Stress: Not on file  Relationships   Social connections    Talks on phone: Not on file    Gets together: Not on file    Attends religious service: Not on  file    Active member of club or organization: Not on file    Attends meetings of clubs or organizations: Not on file    Relationship status: Not on file  Other Topics Concern   Not on file  Social History Narrative   Not on file     Review of Systems: A 12 point ROS discussed and pertinent positives are indicated in the HPI above.  All other systems are negative.  Review of Systems  Constitutional: Negative for chills and fever.  Respiratory: Negative for shortness of breath and wheezing.   Cardiovascular: Negative for chest pain and palpitations.  Gastrointestinal: Negative for abdominal pain.  Neurological: Negative for headaches.  Psychiatric/Behavioral: Negative for behavioral problems and confusion.    Vital Signs: BP 136/69 (BP Location: Right Arm)    Pulse (!) 105    Temp 99 F (37.2 C) (Oral)    Resp 19    Ht 4' 11.5" (1.511 m)    Wt 190 lb 11.2 oz (86.5 kg)    SpO2 92%    BMI 37.87 kg/m   Physical Exam Vitals signs and nursing note reviewed.  Constitutional:      General: She is not in acute distress.    Appearance: Normal appearance.  Cardiovascular:     Rate and Rhythm: Normal rate and regular rhythm.     Heart sounds: Normal heart sounds. No murmur.  Pulmonary:     Effort: Pulmonary effort is normal. No respiratory distress.     Breath sounds: Normal breath sounds. No wheezing.  Skin:    General: Skin is warm and dry.  Neurological:     Mental Status: She is alert and oriented to person, place, and time.  Psychiatric:        Mood and Affect: Mood normal.        Behavior: Behavior normal.        Thought Content: Thought content normal.        Judgment: Judgment normal.      MD Evaluation Airway: WNL Heart: WNL Abdomen: WNL Chest/ Lungs: WNL ASA  Classification: 3 Mallampati/Airway Score: Two   Imaging: Ct Abdomen Pelvis Wo Contrast  Result Date: 10/06/2018 CLINICAL DATA:  Altered mental status, nausea, vomiting and fevers since 10/05/2018.  EXAM: CT ABDOMEN AND PELVIS WITHOUT CONTRAST TECHNIQUE: Multidetector CT imaging of the abdomen and pelvis was performed following the standard protocol without IV contrast. COMPARISON:  CT pelvis 07/04/2017. FINDINGS: Lower chest: There is cardiomegaly. Tiny right pleural effusion is noted. Minimal dependent atelectasis is present. Hepatobiliary: The liver is diffusely low attenuating and measures 20 cm craniocaudal. No focal liver lesion. Status post cholecystectomy. Biliary tree is unremarkable. Pancreas: Unremarkable. No pancreatic ductal dilatation or surrounding inflammatory changes. Spleen: Normal in size without  focal abnormality. Adrenals/Urinary Tract: The adrenal glands appear normal. The right kidney appears edematous relative to the left. There is stranding about the right kidney and ureter. No stone is identified. Small phlebolith over the right psoas is unchanged compared to the prior CT. 0.4 cm nonobstructing stone left kidney noted. Urinary bladder appears normal. Stomach/Bowel: Stomach is within normal limits. Appendix appears normal. The appendix is not visualized but no evidence of appendicitis is seen. No evidence of bowel wall thickening, distention, or inflammatory changes. Vascular/Lymphatic: No significant vascular findings are present. No enlarged abdominal or pelvic lymph nodes. Reproductive: Status post hysterectomy. No adnexal masses. Other: There is a small volume of free pelvic fluid. Trace amount of fluid off the inferior margin of the liver is also identified. No focal fluid collection. Musculoskeletal: No fracture or focal bony lesion. Trace anterolisthesis L4 on L5 due to facet arthropathy noted. IMPRESSION: The right kidney appears edematous with stranding about the kidney and ureter most worrisome for pyelonephritis and possibly ureteritis. Negative for right urinary tract stone. 0.4 cm nonobstructing stone left kidney. Hepatomegaly and diffuse fatty infiltration of the liver.  Cardiomegaly. Trace right pleural effusion. Electronically Signed   By: Inge Rise M.D.   On: 10/06/2018 15:44   Ct Head Wo Contrast  Result Date: 10/06/2018 CLINICAL DATA:  Nausea, vomiting and fever for 4 days. Confusion since 12 noon yesterday. EXAM: CT HEAD WITHOUT CONTRAST TECHNIQUE: Contiguous axial images were obtained from the base of the skull through the vertex without intravenous contrast. COMPARISON:  None. FINDINGS: Brain: No evidence of acute infarction, hemorrhage, hydrocephalus, extra-axial collection or mass lesion/mass effect. Vascular: No hyperdense vessel or unexpected calcification. Skull: Intact.  No focal lesion. Sinuses/Orbits: Status post cataract surgery.  Otherwise negative. Other: None. IMPRESSION: Negative head CT. Electronically Signed   By: Inge Rise M.D.   On: 10/06/2018 11:14   US Abdomen Complete  Result Date: 10/07/2018 CLINICAL DATA:  Abnormal LFTs EXAM: ABDOMEN ULTRASOUND COMPLETE COMPARISON:  None. FINDINGS: Gallbladder: Surgically absent. Common bile duct: Diameter: 3.3 mm Liver: Increased heterogeneous echotexture with no focal mass. Portal vein is patent on color Doppler imaging with normal direction of blood flow towards the liver. IVC: No abnormality visualized. Pancreas: Visualized portion unremarkable. Spleen: Size and appearance within normal limits. Right Kidney: Length: 11.8 cm. Echogenicity within normal limits. No mass or hydronephrosis visualized. Left Kidney: Length: 11.3 cm. Contains a nonshadowing stone in the mid to lower pole. Abdominal aorta: Obscured distally by shadowing bowel gas. No aneurysm seen. Other findings: Small right pleural effusion.  Small ascites. IMPRESSION: 1. Small right pleural effusion and ascites. 2. Increased heterogeneous echotexture in the liver is nonspecific but often seen with hepatic steatosis. 3. No other abnormalities identified. Electronically Signed   By: Dorise Bullion III M.D   On: 10/07/2018 14:18   Dg  Chest Port 1 View  Result Date: 10/10/2018 CLINICAL DATA:  Tachypnea EXAM: PORTABLE CHEST 1 VIEW COMPARISON:  10/06/2018, 07/03/2017 FINDINGS: Low lung volumes. Streaky atelectasis or mild infiltrates at the bases. Cardiomegaly with slight central congestion. No pneumothorax. IMPRESSION: 1. Cardiomegaly with slight central congestion 2. Streaky atelectasis or minimal infiltrates at both bases Electronically Signed   By: Donavan Foil M.D.   On: 10/10/2018 15:49   Dg Chest Port 1 View  Result Date: 10/06/2018 CLINICAL DATA:  Fever, tachypnea, history hypertension, diabetes mellitus EXAM: PORTABLE CHEST 1 VIEW COMPARISON:  Portable exam 0903 hours compared to 07/03/2017 FINDINGS: Borderline of cardiac silhouette, likely accentuated by low lung  volumes and lordotic technique. Mediastinal contours and pulmonary vascularity normal. Hypoinflated lungs without infiltrate, pleural effusion or pneumothorax. Bones demineralized. IMPRESSION: No acute abnormalities. Electronically Signed   By: Lavonia Dana M.D.   On: 10/06/2018 09:39    Labs:  CBC: Recent Labs    10/17/18 0701 10/18/18 0704 10/19/18 0649 10/21/18 0535  WBC 13.8* 11.9* 12.2* 12.1*  HGB 9.0* 8.1* 7.5* 8.0*  HCT 28.2* 26.3* 23.3* 25.4*  PLT 350 327 385 395    COAGS: Recent Labs    10/21/18 1027  INR 1.2    BMP: Recent Labs    10/19/18 0649 10/19/18 1528 10/20/18 0159 10/21/18 0535  NA 134* 133* 135 135  K 5.1 4.8 4.8 4.8  CL 102 100 102 99  CO2 22 22 23 22   GLUCOSE 146* 183* 87 123*  BUN 76* 75* 75* 78*  CALCIUM 7.8* 7.8* 7.8* 7.9*  CREATININE 5.36* 5.44* 5.55* 5.82*  GFRNONAA 8* 8* 8* 7*  GFRAA 9* 9* 9* 8*    LIVER FUNCTION TESTS: Recent Labs    10/09/18 0301 10/10/18 0306  10/12/18 0231  10/14/18 0212  10/18/18 0704 10/19/18 0649 10/20/18 0159 10/21/18 0535  BILITOT 3.9* 2.9*  --  1.7*  --  1.2  --   --   --   --   --   AST 79* 46*  --  21  --  17  --   --   --   --   --   ALT 61* 51*  --  29  --  20   --   --   --   --   --   ALKPHOS 268* 270*  --  239*  --  233*  --   --   --   --   --   PROT 5.2* 5.1*  --  5.0*  --  5.1*  --   --   --   --   --   ALBUMIN 1.6*   1.6* 1.5*   1.4*   < > 1.4*   1.4*   < > 1.4*   1.4*   < > 1.5* 1.6* 1.6* 1.7*   < > = values in this interval not displayed.     Assessment and Plan:  AKI in need of HD. Plan for image-guided tunneled HD catheter placement tentatively for today (pending scheduling) with Dr. Vernard Gambles. Patient has been NPO since 0800 this AM- ok to proceed after 1400 today per IR protocol. Afebrile. She does not take blood thinners. INR 1.2 today.  Risks and benefits discussed with the patient including, but not limited to bleeding, infection, vascular injury, pneumothorax which may require chest tube placement, air embolism or even death. All of the patient's questions were answered, patient is agreeable to proceed. Consent signed and in chart.   Thank you for this interesting consult.  I greatly enjoyed meeting Allison Patel and look forward to participating in their care.  A copy of this report was sent to the requesting provider on this date.  Electronically Signed: Earley Abide, PA-C 10/21/2018, 11:27 AM   I spent a total of 40 Minutes in face to face in clinical consultation, greater than 50% of which was counseling/coordinating care for AKI/tunneled HD catheter placement.

## 2018-10-21 NOTE — Progress Notes (Signed)
[facility] NEPHROLOGY PROGRESS NOTE  Assessment/ Plan: Pt is a 63 y.o. yo female with history of hypertension, diabetes, hypothyroid presents presented with sepsis due to pyelonephritis consulted for AKI.  #Acute kidney injury, nonoliguric likely due to sepsis and pyelonephritis concomitant with the hemodynamic change related with ARB/HCTZ: -Patient has poorly controlled diabetes with hba1c>14 therefore she probably has underlying CKD.  UA with 3.2 g proteinuria.  Other serology including ANA, ANCA, kappa lamda ratio, complements negative.  Renal US with no obstruction. -No improvement in renal function, serum creatinine level continued to worsen to 5.8 today and urine output is marginal.  She has lower extremity edema.  Plan to start dialysis today after catheter placement.  I spoke with IR for [facility].  We will keep patient n.p.o.  Patient agreed with the plan. -No plan for permanent access because this is AKI. -CLIP for AKI. -dc sod bicarbonate.  #Proteinuria probably due to diabetic nephropathy.  Needs glycemic control.   #Pyelonephritis: completed Cipro per primary team.  #Hypertension: Monitor blood pressure.  Continue current medication.  #Enterobacter bacteremia: On Cipro for 14 days.  #Hyperkalemia: K 4.8 today. Monitor electrolytes.  Subjective: Seen and examined at bedside.  Urine output is marginal and has worsening lower extremity edema.  Denies chest pain, shortness of breath, nausea or vomiting. Objective Vital signs in last 24 hours: Vitals:   10/20/18 1643 10/20/18 2113 10/21/18 0519 10/21/18 0858  BP: (!) 152/74 (!) 149/71 (!) 150/85 136/69  Pulse: 100 (!) 106 99 (!) 105  Resp: 18 18 18 19   Temp: 98.2 F (36.8 C) 99 F (37.2 C) 98.8 F (37.1 C) 99 F (37.2 C)  TempSrc: Oral Oral Oral Oral  SpO2: 92% 90% 91% 92%  Weight:  86.5 kg    Height:       Weight change: 0 kg  Intake/Output Summary (Last 24 hours) at 10/21/2018 1016 Last data filed at  10/21/2018 0855 Gross per 24 hour  Intake 600 ml  Output 400 ml  Net 200 ml       Labs: Basic Metabolic Panel: Recent Labs  Lab 10/19/18 0649 10/19/18 1528 10/20/18 0159 10/21/18 0535  NA 134* 133* 135 135  K 5.1 4.8 4.8 4.8  CL 102 100 102 99  CO2 22 22 23 22   GLUCOSE 146* 183* 87 123*  BUN 76* 75* 75* 78*  CREATININE 5.36* 5.44* 5.55* 5.82*  CALCIUM 7.8* 7.8* 7.8* 7.9*  PHOS 6.7*  --  6.7* 7.1*   Liver Function Tests: Recent Labs  Lab 10/19/18 0649 10/20/18 0159 10/21/18 0535  ALBUMIN 1.6* 1.6* 1.7*   No results for input(s): LIPASE, AMYLASE in the last 168 hours. No results for input(s): AMMONIA in the last 168 hours. CBC: Recent Labs  Lab 10/16/18 0429 10/17/18 0701 10/18/18 0704 10/19/18 0649 10/21/18 0535  WBC 14.9* 13.8* 11.9* 12.2* 12.1*  HGB 8.6* 9.0* 8.1* 7.5* 8.0*  HCT 26.1* 28.2* 26.3* 23.3* 25.4*  MCV 85.6 86.0 89.2 86.3 85.8  PLT 311 350 327 385 395   Cardiac Enzymes: No results for input(s): CKTOTAL, CKMB, CKMBINDEX, TROPONINI in the last 168 hours. CBG: Recent Labs  Lab 10/20/18 0605 10/20/18 1044 10/20/18 1641 10/20/18 2320 10/21/18 0656  GLUCAP 90 171* 155* 117* 118*    Iron Studies: No results for input(s): IRON, TIBC, TRANSFERRIN, FERRITIN in the last 72 hours. Studies/Results: No results found.  Medications: Infusions:   Scheduled Medications: . amLODipine  10 mg Oral Daily  . atorvastatin  40 mg  Oral q1800  . Chlorhexidine Gluconate Cloth  6 each Topical Q0600  . cloNIDine  0.2 mg Oral BID  . ferrous sulfate  325 mg Oral Q breakfast  . insulin aspart  0-9 Units Subcutaneous TID WC  . insulin aspart  8 Units Subcutaneous TID WC  . insulin detemir  14 Units Subcutaneous BID  . metoprolol tartrate  12.5 mg Oral BID  . polyethylene glycol  17 g Oral Daily    have reviewed scheduled and prn medications.  Physical Exam: General:NAD, comfortable, pleasant Heart:RRR, s1s2 nl, no rubs Lungs: Clear b/l, no  crackle Abdomen:soft, Non-tender, non-distended Extremities: LE edema + Neurologic: Alert, awake, nonfocal. Skin: No rash or ulcer.  Ricardo Schubach Prasad Gena Laski 10/21/2018,10:16 AM  LOS: 15 days  Pager: [phone]

## 2018-10-22 ENCOUNTER — Encounter (HOSPITAL_COMMUNITY): Payer: Self-pay | Admitting: Interventional Radiology

## 2018-10-22 LAB — RENAL FUNCTION PANEL
Albumin: 1.7 g/dL — ABNORMAL LOW (ref 3.5–5.0)
Anion gap: 12 (ref 5–15)
BUN: 52 mg/dL — ABNORMAL HIGH (ref 8–23)
CO2: 24 mmol/L (ref 22–32)
Calcium: 7.7 mg/dL — ABNORMAL LOW (ref 8.9–10.3)
Chloride: 100 mmol/L (ref 98–111)
Creatinine, Ser: 4.59 mg/dL — ABNORMAL HIGH (ref 0.44–1.00)
GFR calc Af Amer: 11 mL/min — ABNORMAL LOW (ref >60)
GFR calc non Af Amer: 10 mL/min — ABNORMAL LOW (ref >60)
Glucose, Bld: 136 mg/dL — ABNORMAL HIGH (ref 70–99)
Phosphorus: 5.8 mg/dL — ABNORMAL HIGH (ref 2.5–4.6)
Potassium: 4.8 mmol/L (ref 3.5–5.1)
Sodium: 136 mmol/L (ref 135–145)

## 2018-10-22 LAB — GLUCOSE, CAPILLARY
Glucose-Capillary: 102 mg/dL — ABNORMAL HIGH (ref 70–99)
Glucose-Capillary: 111 mg/dL — ABNORMAL HIGH (ref 70–99)
Glucose-Capillary: 127 mg/dL — ABNORMAL HIGH (ref 70–99)
Glucose-Capillary: 153 mg/dL — ABNORMAL HIGH (ref 70–99)
Glucose-Capillary: 64 mg/dL — ABNORMAL LOW (ref 70–99)
Glucose-Capillary: 75 mg/dL (ref 70–99)

## 2018-10-22 LAB — CBC
HCT: 22 % — ABNORMAL LOW (ref 36.0–46.0)
Hemoglobin: 7.1 g/dL — ABNORMAL LOW (ref 12.0–15.0)
MCH: 27.6 pg (ref 26.0–34.0)
MCHC: 32.3 g/dL (ref 30.0–36.0)
MCV: 85.6 fL (ref 80.0–100.0)
Platelets: 344 10*3/uL (ref 150–400)
RBC: 2.57 MIL/uL — ABNORMAL LOW (ref 3.87–5.11)
RDW: 14.4 % (ref 11.5–15.5)
WBC: 12.2 10*3/uL — ABNORMAL HIGH (ref 4.0–10.5)
nRBC: 0 % (ref 0.0–0.2)

## 2018-10-22 LAB — MAGNESIUM: Magnesium: 1.8 mg/dL (ref 1.7–2.4)

## 2018-10-22 MED ORDER — SODIUM CHLORIDE 0.9 % IV SOLN: 100.0000 mL | INTRAVENOUS | Status: DC | PRN

## 2018-10-22 MED ORDER — SODIUM CHLORIDE 0.9 % IV SOLN
510.0000 mg | Freq: Once | INTRAVENOUS | Status: AC
  Administered 2018-10-22: 510 mg via INTRAVENOUS
  Filled 2018-10-22: qty 17

## 2018-10-22 MED ORDER — HEPARIN SODIUM (PORCINE) 1000 UNIT/ML DIALYSIS
1000.0000 [IU] | INTRAMUSCULAR | Status: DC | PRN
  Administered 2018-10-22: 15:00:00 1000 [IU] via INTRAVENOUS_CENTRAL

## 2018-10-22 MED ORDER — CHLORHEXIDINE GLUCONATE CLOTH 2 % EX PADS
6.0000 | MEDICATED_PAD | Freq: Every day | CUTANEOUS | Status: DC
  Administered 2018-10-23: 6 via TOPICAL

## 2018-10-22 MED ORDER — LIDOCAINE-PRILOCAINE 2.5-2.5 % EX CREA: 1.0000 "application " | TOPICAL_CREAM | CUTANEOUS | Status: DC | PRN

## 2018-10-22 MED ORDER — DARBEPOETIN ALFA 60 MCG/0.3ML IJ SOSY
60.0000 ug | PREFILLED_SYRINGE | INTRAMUSCULAR | Status: DC
  Administered 2018-10-22: 60 ug via INTRAVENOUS
  Filled 2018-10-22: qty 0.3

## 2018-10-22 MED ORDER — PENTAFLUOROPROP-TETRAFLUOROETH EX AERO: 1.0000 "application " | INHALATION_SPRAY | CUTANEOUS | Status: DC | PRN

## 2018-10-22 MED ORDER — LIDOCAINE HCL (PF) 1 % IJ SOLN: 5.0000 mL | INTRAMUSCULAR | Status: DC | PRN

## 2018-10-22 MED ORDER — HEPARIN SODIUM (PORCINE) 1000 UNIT/ML DIALYSIS: 20.0000 [IU]/kg | INTRAMUSCULAR | Status: DC | PRN

## 2018-10-22 MED ORDER — ALTEPLASE 2 MG IJ SOLR: 2.0000 mg | Freq: Once | INTRAMUSCULAR | Status: DC | PRN

## 2018-10-22 MED ORDER — CHLORHEXIDINE GLUCONATE CLOTH 2 % EX PADS: 6.0000 | MEDICATED_PAD | Freq: Every day | CUTANEOUS | Status: DC

## 2018-10-22 NOTE — Progress Notes (Signed)
OT Cancellation Note  Patient Details Name: Allison Patel MRN: [id number] DOB: January 16, 1956   Cancelled Treatment:    Reason Eval/Treat Not Completed: Patient at procedure or test/ unavailable. Pt at HD. OT will continue to follow.   Minus Breeding, MSOT, OTR/L  Supplemental Rehabilitation Services  [phone]   Marius Ditch 10/22/2018, 2:58 PM

## 2018-10-22 NOTE — Progress Notes (Signed)
PROGRESS NOTE    Allison Patel  ZDG:[id number] DOB: [birth year] DOA: 10/06/2018 PCP: Elby Showers, MD    Brief Narrative:  63 year old female who presented with confusion.  She does have significant past medical history for type II diabetes mellitus, and hypertension.  Reported 3 days of confusion, unable to give detailed history due to acute cognitive impairment.  On her initial physical examination blood pressure was 136/67, heart rate 137, temperature 39.4 C, respiratory rate 47, oxygen saturation 97%, her lungs were clear to auscultation, heart S1-S2 present, tachycardic, abdomen was soft and nontender, no lower extremity edema, she was slow to response, positive confusion and disorientation, unable to finish sentences.  Sodium 129, potassium 4.2, chloride 99, bicarb 15, glucose 389, BUN 53, creatinine 2.89, calcium 7.4, anion gap 15, AST 67, ALT 87, total  bilirubin is 1.9, white count 10.3, hemoglobin 13.3, hematocrit 39.1, platelets 65, SARS COVID-19 was negative.  Urine analysis more than 500 glucose, more than 300 protein, specific gravity 1.015, 21-50 red cells, more than 50 white cells, positive granular casts.  Head CT negative for acute changes, abdominal CT with right kidney edematous with stranding about the kidney and ureter.  No ureteral stone on the right.  Left 0.4 cm nonobstructing stone.  Chest radiograph with hyperinflation, cardiomegaly, mild hilar vascular congestion.  135 bpm, right axis deviation, SVT rhythm, right bundle branch block, no ST segment or T wave changes, positive PVC.  Patient was admitted to the hospital working diagnosis of sepsis due to right pyelonephritis complicated by acute kidney injury and SVT.   Patient had a prolonged hospital stay, cultures tested positive for Enterobacter cloaca, responded well to antibiotic therapy.  Unfortunately kidney function continued to deteriorate, she has been started on hemodialysis.    Assessment & Plan:    Principal Problem:   Severe sepsis ([place]) Active Problems:   Benign essential HTN   Hypercholesterolemia without hypertriglyceridemia   Adult hypothyroidism   Diabetes mellitus type 2, uncontrolled (HCC)   HLD (hyperlipidemia)   1. Sepsis due to right pyelonephritis/ enterobacter bacteremia, present on admission. No fevers, now off antibiotic therapy, wbc stable at 12,2 this am.   2. Non oliguric AKI due to ATN. Patient had HD yesterday and repeat session today. Fluid balance is negative 2,303 this am, and she had 1000 ml of ultrafiltration today during HD. Has trace lower extremity edema. Will continue to follow nephrology recommendations for outpatient HD. Her urine output over last 24 H is 900 ml.   3. HTN. Blood pressure systolic 595 mmHg, will continue amlodipine 10 mg day, clonidine 02 mg bid, and metoprolol 12,5 mg po bid, for blood pressure control.   4. T2DM. Fasting glucose this am 136 mg/dl. Will continue with a reduced dose of basal insulin with levimir (14 units daily). Patient is tolerating po well.  Continue with 8 units of pre-meal insulin.   5.  Obesity. BMI is 37. Follow as outpatient.    DVT prophylaxis:  Heparin sq  Code Status: full Family Communication: I spoke yesterday with her daughter over the phone and all questions were addressed.  Disposition Plan/ discharge barriers: possible dc in am.   Body mass index is 38.53 kg/m. Malnutrition Type:  Nutrition Problem: Increased nutrient needs Etiology: acute illness(sepsis, AKI)   Malnutrition Characteristics:  Signs/Symptoms: estimated needs   Nutrition Interventions:  Interventions: Nepro shake  RN Pressure Injury Documentation:     Consultants:   Nephrology   Procedures:   Right IJ tunneled catheter  Antimicrobials:       Subjective: Patient is feeling well, examined during HD, no dyspnea or chest pain, no nausea or vomiting.   Objective: Vitals:   10/21/18 2339 10/22/18 0025  10/22/18 0437 10/22/18 0900  BP: (!) 168/84 (!) 158/71 (!) 141/66 (!) 149/71  Pulse: (!) 101 (!) 108 92 (!) 101  Resp: (!) 21 18 18 18   Temp:  98.4 F (36.9 C) 97.6 F (36.4 C) 98.7 F (37.1 C)  TempSrc:  Oral Oral Oral  SpO2: 95% 92% 95% 95%  Weight:      Height:        Intake/Output Summary (Last 24 hours) at 10/22/2018 1213 Last data filed at 10/22/2018 0900 Gross per 24 hour  Intake 100 ml  Output 1000 ml  Net -900 ml   Filed Weights   10/20/18 0500 10/20/18 2113 10/21/18 2126  Weight: 86.5 kg 86.5 kg 88 kg    Examination:   General: deconditioned  Neurology: Awake and alert, non focal  E ENT: no pallor, no icterus, oral mucosa moist Cardiovascular: No JVD. S1-S2 present, rhythmic, no gallops, rubs, or murmurs. Trace bilateral pitting lower extremity edema. Pulmonary: positive breath sounds bilaterally, adequate air movement, no wheezing, rhonchi or rales. Gastrointestinal. Abdomen protuberant with no organomegaly, non tender, no rebound or guarding Skin. No rashes Musculoskeletal: no joint deformities     Data Reviewed: I have personally reviewed following labs and imaging studies  CBC: Recent Labs  Lab 10/18/18 0704 10/19/18 0649 10/21/18 0535 10/21/18 2203 10/22/18 0517  WBC 11.9* 12.2* 12.1* 10.5 12.2*  HGB 8.1* 7.5* 8.0* 7.5* 7.1*  HCT 26.3* 23.3* 25.4* 23.6* 22.0*  MCV 89.2 86.3 85.8 86.4 85.6  PLT 327 385 395 383 409   Basic Metabolic Panel: Recent Labs  Lab 10/18/18 0704  10/19/18 0649 10/19/18 1528 10/20/18 0159 10/21/18 0535 10/21/18 2203 10/22/18 0517  NA 134*   < > 134* 133* 135 135 133* 136  K 5.7*   < > 5.1 4.8 4.8 4.8 5.0 4.8  CL 104   < > 102 100 102 99 98 100  CO2 17*   < > 22 22 23 22 22 24   GLUCOSE 169*   < > 146* 183* 87 123* 127* 136*  BUN 78*   < > 76* 75* 75* 78* 78* 52*  CREATININE 4.94*   < > 5.36* 5.44* 5.55* 5.82* 5.75* 4.59*  CALCIUM 7.4*   < > 7.8* 7.8* 7.8* 7.9* 7.8* 7.7*  MG 1.9  --  2.0 1.9  --  2.0  --  1.8   PHOS 6.2*  --  6.7*  --  6.7* 7.1* 7.6* 5.8*   < > = values in this interval not displayed.   GFR: Estimated Creatinine Clearance: 12.4 mL/min (A) (by C-G formula based on SCr of 4.59 mg/dL (H)). Liver Function Tests: Recent Labs  Lab 10/19/18 0649 10/20/18 0159 10/21/18 0535 10/21/18 2203 10/22/18 0517  ALBUMIN 1.6* 1.6* 1.7* 1.6* 1.7*   No results for input(s): LIPASE, AMYLASE in the last 168 hours. No results for input(s): AMMONIA in the last 168 hours. Coagulation Profile: Recent Labs  Lab 10/21/18 1027  INR 1.2   Cardiac Enzymes: No results for input(s): CKTOTAL, CKMB, CKMBINDEX, TROPONINI in the last 168 hours. BNP (last 3 results) No results for input(s): PROBNP in the last 8760 hours. HbA1C: No results for input(s): HGBA1C in the last 72 hours. CBG: Recent Labs  Lab 10/21/18 1115 10/21/18 1746 10/22/18 0023 10/22/18 0717 10/22/18  [place] 127* 153* 102*   Lipid Profile: No results for input(s): CHOL, HDL, LDLCALC, TRIG, CHOLHDL, LDLDIRECT in the last 72 hours. Thyroid Function Tests: No results for input(s): TSH, T4TOTAL, FREET4, T3FREE, THYROIDAB in the last 72 hours. Anemia Panel: No results for input(s): VITAMINB12, FOLATE, FERRITIN, TIBC, IRON, RETICCTPCT in the last 72 hours.    Radiology Studies: I have reviewed all of the imaging during this hospital visit personally     Scheduled Meds: . amLODipine  10 mg Oral Daily  . atorvastatin  40 mg Oral q1800  . Chlorhexidine Gluconate Cloth  6 each Topical Q0600  . Chlorhexidine Gluconate Cloth  6 each Topical Q0600  . [START ON 10/23/2018] Chlorhexidine Gluconate Cloth  6 each Topical Q0600  . cloNIDine  0.2 mg Oral BID  . [START ON 10/29/2018] darbepoetin (ARANESP) injection - DIALYSIS  60 mcg Intravenous Q Thu-HD  . heparin injection (subcutaneous)  5,000 Units Subcutaneous Q8H  . insulin aspart  0-9 Units Subcutaneous TID WC  . insulin aspart  8 Units Subcutaneous TID WC  . insulin  detemir  14 Units Subcutaneous Daily  . metoprolol tartrate  12.5 mg Oral BID  . polyethylene glycol  17 g Oral Daily   Continuous Infusions: . ferumoxytol       LOS: 16 days        Bellamarie Pflug Gerome Apley, MD

## 2018-10-22 NOTE — Progress Notes (Signed)
Renal Navigator received notification from Dr. Carolin Sicks to initiate referral for OP HD treatment for AKI. Renal Navigator spoke with patient to complete assessment. She requests TTS second shift as she is hopeful to return back to work at some point in the future. Renal Navigator has submitted referral with patient's schedule request to [facility] clinic. Patient's HepB antigen lab result is pending and Renal Navigator will forward lab to Admission Coordinator when it results. Renal Navigator will follow up with Nephrologist and patient once seat schedule has been obtained.  Alphonzo Cruise, [place] Renal Navigator [phone]

## 2018-10-22 NOTE — Plan of Care (Signed)
  Problem: Health Behavior/Discharge Planning: Goal: Ability to manage health-related needs will improve Outcome: Progressing   Problem: Clinical Measurements: Goal: Respiratory complications will improve Outcome: Progressing   Problem: Activity: Goal: Risk for activity intolerance will decrease Outcome: Progressing   

## 2018-10-22 NOTE — Progress Notes (Signed)
PT Cancellation Note  Patient Details Name: Allison Patel MRN: [id number] DOB: [birth year]   Cancelled Treatment:     pt off unit at HD, will cont to follow.  Reinaldo Berber, PT, DPT Acute Rehabilitation Services Pager: [phone] Office: [phone]     Reinaldo Berber 10/22/2018, 2:10 PM

## 2018-10-22 NOTE — Plan of Care (Signed)
  Problem: Activity: Goal: Risk for activity intolerance will decrease Outcome: Progressing   

## 2018-10-22 NOTE — Progress Notes (Addendum)
Allison Patel  Assessment/ Plan: Pt is a 63 y.o. yo female with history of hypertension, diabetes, hypothyroid presents presented with sepsis due to pyelonephritis consulted for AKI.  #Acute kidney injury, nonoliguric likely due to sepsis and pyelonephritis concomitant with the hemodynamic change related with ARB/HCTZ: -Patient has poorly controlled diabetes with hba1c>14 therefore she probably has underlying CKD.  UA with 3.2 g proteinuria.  Other serology including ANA, ANCA, kappa lamda ratio, complements negative.  Renal US with no obstruction. -Status post right IJ TDC by IR and he started dialysis on 10/21/2018.  Tolerated well.  Second treatment today.  Outpatient HD arrangement for HPI ongoing.  No plan for permanent access, watch for renal recovery.  I have discussed this with patient and renal navigator.  Hopefully she can go home tomorrow after the treatment.  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  Order a dose of IV iron and Aranesp today.  Monitor CBC.  #Proteinuria probably due to diabetic nephropathy.  Needs glycemic control.   #Pyelonephritis: completed Cipro per primary team.  #Hypertension: Monitor blood pressure.  Continue current medication.  #Enterobacter bacteremia: On Cipro for 14 days.  #Hyperkalemia: Improved. Monitor electrolytes.  Subjective: Seen and examined at bedside.  Tolerated HD yesterday with around 500 cc of UF.  She has right IJ.  No nausea, vomiting, chest pain or shortness of breath. Objective Vital signs in last 24 hours: Vitals:   10/21/18 2339 10/22/18 0025 10/22/18 0437 10/22/18 0900  BP: (!) 168/84 (!) 158/71 (!) 141/66 (!) 149/71  Pulse: (!) 101 (!) 108 92 (!) 101  Resp: (!) 21 18 18 18   Temp:  98.4 F (36.9 C) 97.6 F (36.4 C) 98.7 F (37.1 C)  TempSrc:  Oral Oral Oral  SpO2: 95% 92% 95% 95%  Weight:      Height:       Weight change: 1.5 kg  Intake/Output Summary  (Last 24 hours) at 10/22/2018 1155 Last data filed at 10/22/2018 0900 Gross per 24 hour  Intake 100 ml  Output 1000 ml  Net -900 ml       Labs: Basic Metabolic Panel: Recent Labs  Lab 10/21/18 0535 10/21/18 2203 10/22/18 0517  NA 135 133* 136  K 4.8 5.0 4.8  CL 99 98 100  CO2 22 22 24   GLUCOSE 123* 127* 136*  BUN 78* 78* 52*  CREATININE 5.82* 5.75* 4.59*  CALCIUM 7.9* 7.8* 7.7*  PHOS 7.1* 7.6* 5.8*   Liver Function Tests: Recent Labs  Lab 10/21/18 0535 10/21/18 2203 10/22/18 0517  ALBUMIN 1.7* 1.6* 1.7*   No results for input(s): LIPASE, AMYLASE in the last 168 hours. No results for input(s): AMMONIA in the last 168 hours. CBC: Recent Labs  Lab 10/18/18 0704 10/19/18 0649 10/21/18 0535 10/21/18 2203 10/22/18 0517  WBC 11.9* 12.2* 12.1* 10.5 12.2*  HGB 8.1* 7.5* 8.0* 7.5* 7.1*  HCT 26.3* 23.3* 25.4* 23.6* 22.0*  MCV 89.2 86.3 85.8 86.4 85.6  PLT 327 385 395 383 344   Cardiac Enzymes: No results for input(s): CKTOTAL, CKMB, CKMBINDEX, TROPONINI in the last 168 hours. CBG: Recent Labs  Lab 10/21/18 1115 10/21/18 1746 10/22/18 0023 10/22/18 0717 10/22/18 1129  GLUCAP 113* 78 127* 153* 102*    Iron Studies: No results for input(s): IRON, TIBC, TRANSFERRIN, FERRITIN in the last 72 hours. Studies/Results: Ir Fluoro Guide Cv Line Right  Result Date: 10/22/2018 CLINICAL DATA:  Acute renal failure, needs venous access for hemodialysis  EXAM: TUNNELED HEMODIALYSIS CATHETER PLACEMENT WITH ULTRASOUND AND FLUOROSCOPIC GUIDANCE COMPARISON:  None available TECHNIQUE: The procedure, risks, benefits, and alternatives were explained to the patient. Questions regarding the procedure were encouraged and answered. The patient understands and consents to the procedure. As antibiotic prophylaxis, cefazolin 2 g was ordered pre-procedure and administered intravenously within one hour of incision.Patency of the right IJ vein was confirmed with ultrasound with image documentation.  An appropriate skin site was determined. Region was prepped using maximum barrier technique including cap and mask, sterile gown, sterile gloves, large sterile sheet, and Chlorhexidine as cutaneous antisepsis. The region was infiltrated locally with 1% lidocaine. Intravenous Fentanyl 70mcg and Versed 1mg  were administered as conscious sedation during continuous monitoring of the patient's level of consciousness and physiological / cardiorespiratory status by the radiology RN, with a total moderate sedation time of 12 minutes. Under real-time ultrasound guidance, the right IJ vein was accessed with a 21 gauge micropuncture needle; the needle tip within the vein was confirmed with ultrasound image documentation. Needle exchanged over the 018 guidewire for transitional dilator, which allowed advancement of a Benson wire into the IVC. Over this, an MPA catheter was advanced. A Palindrome 19 hemodialysis catheter was tunneled from the right anterior chest wall approach to the right IJ dermatotomy site. The MPA catheter was exchanged over an Amplatz wire for serial vascular dilators which allow placement of a peel-away sheath, through which the catheter was advanced under intermittent fluoroscopy, positioned with its tips in the proximal and midright atrium. Spot chest radiograph confirms good catheter position. No pneumothorax. Catheter was flushed and primed per protocol. Catheter secured externally with O Prolene sutures. The right IJ dermatotomy site was closed with Dermabond. COMPLICATIONS: COMPLICATIONS None immediate FLUOROSCOPY TIME:  2.4 minute; 373  uGym2 DAP IMPRESSION: 1. Technically successful placement of tunneled right IJ hemodialysis catheter with ultrasound and fluoroscopic guidance. Ready for routine use. ACCESS: Remains approachable for percutaneous intervention as needed. Electronically Signed   By: Lucrezia Europe M.D.   On: 10/22/2018 09:39   Ir US Guide Vasc Access Right  Result Date:  10/22/2018 CLINICAL DATA:  Acute renal failure, needs venous access for hemodialysis EXAM: TUNNELED HEMODIALYSIS CATHETER PLACEMENT WITH ULTRASOUND AND FLUOROSCOPIC GUIDANCE COMPARISON:  None available TECHNIQUE: The procedure, risks, benefits, and alternatives were explained to the patient. Questions regarding the procedure were encouraged and answered. The patient understands and consents to the procedure. As antibiotic prophylaxis, cefazolin 2 g was ordered pre-procedure and administered intravenously within one hour of incision.Patency of the right IJ vein was confirmed with ultrasound with image documentation. An appropriate skin site was determined. Region was prepped using maximum barrier technique including cap and mask, sterile gown, sterile gloves, large sterile sheet, and Chlorhexidine as cutaneous antisepsis. The region was infiltrated locally with 1% lidocaine. Intravenous Fentanyl 33mcg and Versed 1mg  were administered as conscious sedation during continuous monitoring of the patient's level of consciousness and physiological / cardiorespiratory status by the radiology RN, with a total moderate sedation time of 12 minutes. Under real-time ultrasound guidance, the right IJ vein was accessed with a 21 gauge micropuncture needle; the needle tip within the vein was confirmed with ultrasound image documentation. Needle exchanged over the 018 guidewire for transitional dilator, which allowed advancement of a Benson wire into the IVC. Over this, an MPA catheter was advanced. A Palindrome 19 hemodialysis catheter was tunneled from the right anterior chest wall approach to the right IJ dermatotomy site. The MPA catheter was exchanged over an Amplatz wire  for serial vascular dilators which allow placement of a peel-away sheath, through which the catheter was advanced under intermittent fluoroscopy, positioned with its tips in the proximal and midright atrium. Spot chest radiograph confirms good catheter position.  No pneumothorax. Catheter was flushed and primed per protocol. Catheter secured externally with O Prolene sutures. The right IJ dermatotomy site was closed with Dermabond. COMPLICATIONS: COMPLICATIONS None immediate FLUOROSCOPY TIME:  2.4 minute; 373  uGym2 DAP IMPRESSION: 1. Technically successful placement of tunneled right IJ hemodialysis catheter with ultrasound and fluoroscopic guidance. Ready for routine use. ACCESS: Remains approachable for percutaneous intervention as needed. Electronically Signed   By: Lucrezia Europe M.D.   On: 10/22/2018 09:39    Medications: Infusions:   Scheduled Medications: . amLODipine  10 mg Oral Daily  . atorvastatin  40 mg Oral q1800  . Chlorhexidine Gluconate Cloth  6 each Topical Q0600  . Chlorhexidine Gluconate Cloth  6 each Topical Q0600  . cloNIDine  0.2 mg Oral BID  . ferrous sulfate  325 mg Oral Q breakfast  . heparin injection (subcutaneous)  5,000 Units Subcutaneous Q8H  . insulin aspart  0-9 Units Subcutaneous TID WC  . insulin aspart  8 Units Subcutaneous TID WC  . insulin detemir  14 Units Subcutaneous Daily  . metoprolol tartrate  12.5 mg Oral BID  . polyethylene glycol  17 g Oral Daily    have reviewed scheduled and prn medications.  Physical Exam: General: Pleasant female, lying in bed comfortable, not in distress Heart:RRR, s1s2 nl, no rubs Lungs: Clear b/l, no crackle Abdomen:soft, Non-tender, non-distended Extremities: LE edema + Neurologic: Alert, awake, nonfocal. Skin: No rash or ulcer. Right IJ tunnel catheter site clean.  Djimon Lundstrom Prasad Lashane Whelpley 10/22/2018,11:55 AM  LOS: 16 days  Pager: [phone]

## 2018-10-22 NOTE — Progress Notes (Signed)
Patient has been accepted for OP HD treatment at [facility] on a TTS schedule with a seat time of 6:05am. Per Nephrology, plans for patient discharge over the weekend, and therefore she will start in the OP HD clinic on Tuesday, 10/27/18. She needs to report to the clinic on Monday 7/6 to sign intake paperwork. Renal Navigator spoke with patient's husband to inform. Clinic is aware of start date. Patient is cleared for discharge from an OP HD standpoint.  Allison Patel. [place], Allison Patel [id number] [phone]  Alphonzo Cruise, [place] Renal Navigator [phone]

## 2018-10-23 LAB — RENAL FUNCTION PANEL
Albumin: 1.7 g/dL — ABNORMAL LOW (ref 3.5–5.0)
Anion gap: 9 (ref 5–15)
BUN: 32 mg/dL — ABNORMAL HIGH (ref 8–23)
CO2: 25 mmol/L (ref 22–32)
Calcium: 7.8 mg/dL — ABNORMAL LOW (ref 8.9–10.3)
Chloride: 99 mmol/L (ref 98–111)
Creatinine, Ser: 3.51 mg/dL — ABNORMAL HIGH (ref 0.44–1.00)
GFR calc Af Amer: 15 mL/min — ABNORMAL LOW (ref >60)
GFR calc non Af Amer: 13 mL/min — ABNORMAL LOW (ref >60)
Glucose, Bld: 137 mg/dL — ABNORMAL HIGH (ref 70–99)
Phosphorus: 4.5 mg/dL (ref 2.5–4.6)
Potassium: 4.3 mmol/L (ref 3.5–5.1)
Sodium: 133 mmol/L — ABNORMAL LOW (ref 135–145)

## 2018-10-23 LAB — HEPATITIS B SURFACE ANTIGEN: Hepatitis B Surface Ag: NEGATIVE

## 2018-10-23 LAB — CBC
HCT: 22.4 % — ABNORMAL LOW (ref 36.0–46.0)
Hemoglobin: 7.1 g/dL — ABNORMAL LOW (ref 12.0–15.0)
MCH: 27.5 pg (ref 26.0–34.0)
MCHC: 31.7 g/dL (ref 30.0–36.0)
MCV: 86.8 fL (ref 80.0–100.0)
Platelets: 335 10*3/uL (ref 150–400)
RBC: 2.58 MIL/uL — ABNORMAL LOW (ref 3.87–5.11)
RDW: 14.1 % (ref 11.5–15.5)
WBC: 11.1 10*3/uL — ABNORMAL HIGH (ref 4.0–10.5)
nRBC: 0 % (ref 0.0–0.2)

## 2018-10-23 LAB — GLUCOSE, CAPILLARY: Glucose-Capillary: 113 mg/dL — ABNORMAL HIGH (ref 70–99)

## 2018-10-23 LAB — MAGNESIUM: Magnesium: 1.8 mg/dL (ref 1.7–2.4)

## 2018-10-23 MED ORDER — LIDOCAINE-PRILOCAINE 2.5-2.5 % EX CREA: 1.0000 "application " | TOPICAL_CREAM | CUTANEOUS | Status: DC | PRN

## 2018-10-23 MED ORDER — SODIUM CHLORIDE 0.9 % IV SOLN: 100.0000 mL | INTRAVENOUS | Status: DC | PRN

## 2018-10-23 MED ORDER — METOPROLOL TARTRATE 25 MG PO TABS: 12.5000 mg | ORAL_TABLET | Freq: Two times a day (BID) | ORAL | 0 refills | Status: DC

## 2018-10-23 MED ORDER — INSULIN LISPRO (1 UNIT DIAL) 100 UNIT/ML (KWIKPEN): 10.0000 [IU] | PEN_INJECTOR | Freq: Two times a day (BID) | SUBCUTANEOUS | 11 refills | Status: DC

## 2018-10-23 MED ORDER — INSULIN GLARGINE 100 UNIT/ML SOLOSTAR PEN: 10.0000 [IU] | PEN_INJECTOR | Freq: Every day | SUBCUTANEOUS | 0 refills | Status: DC

## 2018-10-23 MED ORDER — ALTEPLASE 2 MG IJ SOLR: 2.0000 mg | Freq: Once | INTRAMUSCULAR | Status: DC | PRN

## 2018-10-23 MED ORDER — AMLODIPINE BESYLATE 10 MG PO TABS: 10.0000 mg | ORAL_TABLET | Freq: Every day | ORAL | 0 refills | Status: DC

## 2018-10-23 MED ORDER — HEPARIN SODIUM (PORCINE) 1000 UNIT/ML DIALYSIS: 20.0000 [IU]/kg | INTRAMUSCULAR | Status: DC | PRN

## 2018-10-23 MED ORDER — PENTAFLUOROPROP-TETRAFLUOROETH EX AERO: 1.0000 "application " | INHALATION_SPRAY | CUTANEOUS | Status: DC | PRN

## 2018-10-23 MED ORDER — HEPARIN SODIUM (PORCINE) 1000 UNIT/ML DIALYSIS: 1000.0000 [IU] | INTRAMUSCULAR | Status: DC | PRN

## 2018-10-23 MED ORDER — LIDOCAINE HCL (PF) 1 % IJ SOLN: 5.0000 mL | INTRAMUSCULAR | Status: DC | PRN

## 2018-10-23 MED ORDER — INSULIN GLARGINE 100 UNIT/ML SOLOSTAR PEN: 10.0000 [IU] | PEN_INJECTOR | Freq: Every day | SUBCUTANEOUS | 0 refills | Status: AC

## 2018-10-23 NOTE — Progress Notes (Signed)
[facility] NEPHROLOGY PROGRESS NOTE  Assessment/ Plan: Pt is a 63 y.o. yo female with history of hypertension, diabetes, hypothyroid presents presented with sepsis due to pyelonephritis consulted for AKI.  #Acute kidney injury, nonoliguric likely due to sepsis and pyelonephritis concomitant with the hemodynamic change related with ARB/HCTZ: -Patient has poorly controlled diabetes with hba1c>14 therefore she probably has underlying CKD.  UA with 3.2 g proteinuria.  Other serology including ANA, ANCA, kappa lamda ratio, complements negative.  Renal US with no obstruction. -Status post right IJ TDC by IR and started dialysis on 10/21/2018.  Third HD treatment today, tolerating well so far.  Outpatient HD arranged at [facility] clinic on TTS schedule, time 6:05 AM.  Okay to discharge home from renal perspective.  I advised patient salt and fluid restriction through this weekend. -No permanent access because she has AKI and expect some recovery.  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  Received IV iron on 7/2, continue ESA.  She will need weekly iron as outpatient.  Monitor CBC.  #Proteinuria probably due to diabetic nephropathy.  Needs glycemic control.   #Pyelonephritis: completed Cipro per primary team.  #Hypertension: Monitor blood pressure.  Continue current medication.  #Enterobacter bacteremia: Completed antibiotics treatment.Marland Kitchen  #Hyperkalemia: Improved. Monitor electrolytes.  Subjective: Seen and examined at dialysis unit.  Tolerating well.  Denies nausea, vomiting, chest pain, shortness of breath.  Objective Vital signs in last 24 hours: Vitals:   10/23/18 0930 10/23/18 1000 10/23/18 1100 10/23/18 1107  BP: (!) 133/57 115/60 (!) (P) 130/56 (!) (P) 129/57  Pulse: (!) 101 100 (!) (P) 107 (!) (P) 106  Resp: 18 16 (P) 18 (P) 20  Temp:   (P) 98.1 F (36.7 C)   TempSrc:   (P) Oral   SpO2:   (P) 99%   Weight:   (P) 84.3 kg    Height:       Weight change: -0.8 kg  Intake/Output Summary (Last 24 hours) at 10/23/2018 1123 Last data filed at 10/23/2018 1100 Gross per 24 hour  Intake 180 ml  Output 1500 ml  Net -1320 ml       Labs: Basic Metabolic Panel: Recent Labs  Lab 10/21/18 2203 10/22/18 0517 10/23/18 0502  NA 133* 136 133*  K 5.0 4.8 4.3  CL 98 100 99  CO2 22 24 25   GLUCOSE 127* 136* 137*  BUN 78* 52* 32*  CREATININE 5.75* 4.59* 3.51*  CALCIUM 7.8* 7.7* 7.8*  PHOS 7.6* 5.8* 4.5   Liver Function Tests: Recent Labs  Lab 10/21/18 2203 10/22/18 0517 10/23/18 0502  ALBUMIN 1.6* 1.7* 1.7*   No results for input(s): LIPASE, AMYLASE in the last 168 hours. No results for input(s): AMMONIA in the last 168 hours. CBC: Recent Labs  Lab 10/19/18 0649 10/21/18 0535 10/21/18 2203 10/22/18 0517 10/23/18 0502  WBC 12.2* 12.1* 10.5 12.2* 11.1*  HGB 7.5* 8.0* 7.5* 7.1* 7.1*  HCT 23.3* 25.4* 23.6* 22.0* 22.4*  MCV 86.3 85.8 86.4 85.6 86.8  PLT 385 395 383 344 335   Cardiac Enzymes: No results for input(s): CKTOTAL, CKMB, CKMBINDEX, TROPONINI in the last 168 hours. CBG: Recent Labs  Lab 10/22/18 0717 10/22/18 1129 10/22/18 1644 10/22/18 1717 10/22/18 2029  GLUCAP 153* 102* 64* 75 111*    Iron Studies: No results for input(s): IRON, TIBC, TRANSFERRIN, FERRITIN in the last 72 hours. Studies/Results: Ir Fluoro Guide Cv Line Right  Result Date: 10/22/2018 CLINICAL DATA:  Acute renal failure, needs  venous access for hemodialysis EXAM: TUNNELED HEMODIALYSIS CATHETER PLACEMENT WITH ULTRASOUND AND FLUOROSCOPIC GUIDANCE COMPARISON:  None available TECHNIQUE: The procedure, risks, benefits, and alternatives were explained to the patient. Questions regarding the procedure were encouraged and answered. The patient understands and consents to the procedure. As antibiotic prophylaxis, cefazolin 2 g was ordered pre-procedure and administered intravenously within one hour of incision.Patency of the  right IJ vein was confirmed with ultrasound with image documentation. An appropriate skin site was determined. Region was prepped using maximum barrier technique including cap and mask, sterile gown, sterile gloves, large sterile sheet, and Chlorhexidine as cutaneous antisepsis. The region was infiltrated locally with 1% lidocaine. Intravenous Fentanyl 38mcg and Versed 1mg  were administered as conscious sedation during continuous monitoring of the patient's level of consciousness and physiological / cardiorespiratory status by the radiology RN, with a total moderate sedation time of 12 minutes. Under real-time ultrasound guidance, the right IJ vein was accessed with a 21 gauge micropuncture needle; the needle tip within the vein was confirmed with ultrasound image documentation. Needle exchanged over the 018 guidewire for transitional dilator, which allowed advancement of a Benson wire into the IVC. Over this, an MPA catheter was advanced. A Palindrome 19 hemodialysis catheter was tunneled from the right anterior chest wall approach to the right IJ dermatotomy site. The MPA catheter was exchanged over an Amplatz wire for serial vascular dilators which allow placement of a peel-away sheath, through which the catheter was advanced under intermittent fluoroscopy, positioned with its tips in the proximal and midright atrium. Spot chest radiograph confirms good catheter position. No pneumothorax. Catheter was flushed and primed per protocol. Catheter secured externally with O Prolene sutures. The right IJ dermatotomy site was closed with Dermabond. COMPLICATIONS: COMPLICATIONS None immediate FLUOROSCOPY TIME:  2.4 minute; 373  uGym2 DAP IMPRESSION: 1. Technically successful placement of tunneled right IJ hemodialysis catheter with ultrasound and fluoroscopic guidance. Ready for routine use. ACCESS: Remains approachable for percutaneous intervention as needed. Electronically Signed   By: Lucrezia Europe M.D.   On: 10/22/2018  09:39   Ir US Guide Vasc Access Right  Result Date: 10/22/2018 CLINICAL DATA:  Acute renal failure, needs venous access for hemodialysis EXAM: TUNNELED HEMODIALYSIS CATHETER PLACEMENT WITH ULTRASOUND AND FLUOROSCOPIC GUIDANCE COMPARISON:  None available TECHNIQUE: The procedure, risks, benefits, and alternatives were explained to the patient. Questions regarding the procedure were encouraged and answered. The patient understands and consents to the procedure. As antibiotic prophylaxis, cefazolin 2 g was ordered pre-procedure and administered intravenously within one hour of incision.Patency of the right IJ vein was confirmed with ultrasound with image documentation. An appropriate skin site was determined. Region was prepped using maximum barrier technique including cap and mask, sterile gown, sterile gloves, large sterile sheet, and Chlorhexidine as cutaneous antisepsis. The region was infiltrated locally with 1% lidocaine. Intravenous Fentanyl 82mcg and Versed 1mg  were administered as conscious sedation during continuous monitoring of the patient's level of consciousness and physiological / cardiorespiratory status by the radiology RN, with a total moderate sedation time of 12 minutes. Under real-time ultrasound guidance, the right IJ vein was accessed with a 21 gauge micropuncture needle; the needle tip within the vein was confirmed with ultrasound image documentation. Needle exchanged over the 018 guidewire for transitional dilator, which allowed advancement of a Benson wire into the IVC. Over this, an MPA catheter was advanced. A Palindrome 19 hemodialysis catheter was tunneled from the right anterior chest wall approach to the right IJ dermatotomy site. The MPA catheter was exchanged  over an Amplatz wire for serial vascular dilators which allow placement of a peel-away sheath, through which the catheter was advanced under intermittent fluoroscopy, positioned with its tips in the proximal and midright  atrium. Spot chest radiograph confirms good catheter position. No pneumothorax. Catheter was flushed and primed per protocol. Catheter secured externally with O Prolene sutures. The right IJ dermatotomy site was closed with Dermabond. COMPLICATIONS: COMPLICATIONS None immediate FLUOROSCOPY TIME:  2.4 minute; 373  uGym2 DAP IMPRESSION: 1. Technically successful placement of tunneled right IJ hemodialysis catheter with ultrasound and fluoroscopic guidance. Ready for routine use. ACCESS: Remains approachable for percutaneous intervention as needed. Electronically Signed   By: Lucrezia Europe M.D.   On: 10/22/2018 09:39    Medications: Infusions: . sodium chloride    . sodium chloride      Scheduled Medications: . amLODipine  10 mg Oral Daily  . atorvastatin  40 mg Oral q1800  . Chlorhexidine Gluconate Cloth  6 each Topical Q0600  . cloNIDine  0.2 mg Oral BID  . darbepoetin (ARANESP) injection - DIALYSIS  60 mcg Intravenous Q Thu-HD  . heparin injection (subcutaneous)  5,000 Units Subcutaneous Q8H  . insulin aspart  0-9 Units Subcutaneous TID WC  . insulin aspart  8 Units Subcutaneous TID WC  . insulin detemir  14 Units Subcutaneous Daily  . metoprolol tartrate  12.5 mg Oral BID  . polyethylene glycol  17 g Oral Daily    have reviewed scheduled and prn medications.  Physical Exam: General: Pleasant female, not in distress Heart:RRR, s1s2 nl, no rubs Lungs: Clear b/l, no crackle Abdomen:soft, Non-tender, non-distended Extremities: LE edema + significant improvement Neurologic: Alert, awake, nonfocal. Skin: No rash or ulcer. Right IJ tunnel catheter site clean.   Prasad  10/23/2018,11:23 AM  LOS: 17 days  Pager: [phone]

## 2018-10-23 NOTE — Progress Notes (Signed)
Physical Therapy Treatment Patient Details Name: Allison Patel MRN: [id number] DOB: 1956-01-05 Today's Date: 10/23/2018    History of Present Illness 63 year old woman with DM, HTN, prior gluteal abscess presenting with confusion since Sunday ~3 days. hx DM, HTN p/w severe sepsis secondary to enterobacter pyelnonephritis with hematogenous spread.    PT Comments    Patient back from HD, fatigued, also with c/o of L knee pain limiting ambulation today. I feel she requires a RW at this time for safe ambulation although she does not wish to use one. Husband will have to provide hands on assistance with her at home for safety, pt verbalizes understanding.     Follow Up Recommendations  Home health PT;Supervision/Assistance - 24 hour     Equipment Recommendations  Rolling walker with 5" wheels;3in1 (PT)    Recommendations for Other Services       Precautions / Restrictions Precautions Precautions: Fall Precaution Comments: foley placed 6/24 Restrictions Weight Bearing Restrictions: No    Mobility  Bed Mobility Overal bed mobility: Needs Assistance Bed Mobility: Supine to Sit Rolling: Min guard   Supine to sit: Supervision Sit to supine: Supervision   General bed mobility comments: OOB in chair upon entry   Transfers Overall transfer level: Needs assistance Equipment used: Rolling walker (2 wheeled) Transfers: Sit to/from Stand Sit to Stand: Min guard;Supervision Stand pivot transfers: Min assist       General transfer comment: min guard to close supervision--cueing for hand placement   Ambulation/Gait Ambulation/Gait assistance: Min guard Gait Distance (Feet): 5 Feet Assistive device: 1 person hand held assist Gait Pattern/deviations: Step-through pattern;Decreased stride length;Staggering left;Wide base of support Gait velocity: decreased   General Gait Details: pt unsafe to ambulate without RW   Stairs             Wheelchair Mobility    Modified  Rankin (Stroke Patients Only)       Balance Overall balance assessment: Needs assistance Sitting-balance support: No upper extremity supported;Feet supported Sitting balance-Leahy Scale: Fair     Standing balance support: No upper extremity supported Standing balance-Leahy Scale: Fair Standing balance comment: preference to B UB support dynamically                             Cognition Arousal/Alertness: Awake/alert Behavior During Therapy: WFL for tasks assessed/performed Overall Cognitive Status: Impaired/Different from baseline Area of Impairment: Problem solving                 Orientation Level: Disoriented to;Place;Time;Situation Current Attention Level: Selective Memory: Decreased short-term memory Following Commands: Follows multi-step commands inconsistently;Follows multi-step commands with increased time Safety/Judgement: Decreased awareness of deficits;Decreased awareness of safety Awareness: Emergent Problem Solving: Decreased initiation General Comments: pt with decreased initation and problem sovling when engaging in LB ADLs       Exercises Other Exercises Other Exercises: standing knee flexion, alternating legs x 20 with bil UE support    General Comments        Pertinent Vitals/Pain      Home Living                      Prior Function            PT Goals (current goals can now be found in the care plan section) Acute Rehab PT Goals Patient Stated Goal: to go home PT Goal Formulation: With patient Time For Goal Achievement: 11/02/18 Potential to Achieve  Goals: Good Progress towards PT goals: Progressing toward goals    Frequency    Min 3X/week      PT Plan Current plan remains appropriate    Co-evaluation              AM-PAC PT "6 Clicks" Mobility   Outcome Measure  Help needed turning from your back to your side while in a flat bed without using bedrails?: None Help needed moving from lying on  your back to sitting on the side of a flat bed without using bedrails?: None Help needed moving to and from a bed to a chair (including a wheelchair)?: A Little Help needed standing up from a chair using your arms (e.g., wheelchair or bedside chair)?: A Little Help needed to walk in hospital room?: A Little Help needed climbing 3-5 steps with a railing? : A Lot 6 Click Score: 19    End of Session Equipment Utilized During Treatment: Gait belt Activity Tolerance: Patient limited by fatigue Patient left: with call bell/phone within reach;in chair Nurse Communication: Mobility status PT Visit Diagnosis: Muscle weakness (generalized) (M62.81);Unsteadiness on feet (R26.81)     Time: 8280-0349 PT Time Calculation (min) (ACUTE ONLY): 18 min  Charges:  $Therapeutic Activity: 8-22 mins                    Reinaldo Berber, PT, DPT Acute Rehabilitation Services Pager: [phone] Office: [phone]     Reinaldo Berber 10/23/2018, 12:11 PM

## 2018-10-23 NOTE — Progress Notes (Signed)
OT Cancellation Note  Patient Details Name: Allison Patel MRN: [id number] DOB: [birth year]   Cancelled Treatment:    Reason Eval/Treat Not Completed: Patient at procedure or test/ unavailable (HD); will follow up for OT treatment as schedule permits.  Lou Cal, OT Supplemental Rehabilitation Services Pager [phone] Office [phone]    [name] 10/23/2018, 8:55 AM

## 2018-10-23 NOTE — Discharge Summary (Addendum)
Physician Discharge Summary  Allison Patel SWH:[id number] DOB: 02-Apr-1956 DOA: 10/06/2018  PCP: Elby Showers, MD  Admit date: 10/06/2018 Discharge date: 10/23/2018  Admitted From: Home  Disposition:  Home   Recommendations for Outpatient Follow-up and new medication changes:  1. Follow up with Dr. Renold Genta in 7 days.  2. Patient has been placed on hemodialysis, Tuesday, Thursday and Saturday, [facility] clinic, time 6:05 am. 3. No permanent access for now, she has AKI and may have recovery of her renal function.  4. Patient started on metoprolol for blood pressure control and increased amlodipine to 10 mg, discontinue olmesartan and HCTZ.   Home Health: Yes  Equipment/Devices: walker  Discharge Condition: stable CODE STATUS: full  Diet recommendation: heart healthy, diabetic and renal prudent.   Brief/Interim Summary: 63 year old female who presented with confusion. She does have significant past medical history for type II diabetesmellitus, and hypertension. Reported 3 days of confusion,unable to give detailed history due to acute cognitive impairment.On her initial physical examination blood pressure was 136/67, heart rate 137, temperature 39.4 C, respiratory rate 47, oxygen saturation 97%,her lungswereclear to auscultation, heart S1-S2 present, tachycardic, abdomen was soft and nontender, no lower extremity edema, she was slow to response,positive confusion and disorientation, unable to finish sentences. Sodium 129, potassium 4.2, chloride 99, bicarb 15, glucose 389, BUN 53, creatinine 2.89, calcium 7.4, anion gap 15, AST 67, ALT 87, totalbilirubin was 1.9,white count 10.3, hemoglobin 13.3, hematocrit 39.1, platelets 65,SARS COVID-19 was negative. Urine analysis more than 500 glucose, more than 300 protein, specific gravity 1.015, 21-50 red cells, more than 50 white cells,positive granular casts.Head CT negative for acute changes,abdominal CT with right kidney edematous  with stranding about the kidney and ureter. No ureteral stone on the right. Left 0.4 cm nonobstructing stone. Chest radiograph with hypoinflation, cardiomegaly, mild hilar vascular congestion. EKG135 bpm, right axis deviation, SVT rhythm, right bundle branch block, no ST segment or T wave changes, positive PVC.  Patient was admitted to the hospital working diagnosis of sepsis due to right pyelonephritis complicated by acute kidney injury and SVT.  Patient had a prolonged hospital stay, cultures tested positive for Enterobacter cloacae,responded well to antibiotic therapy. Unfortunately kidney function continued to deteriorate, she has been started on hemodialysis.  1.  Sepsis due to right pyelonephritis, Enterobacter bacteremia, present on admission.  On admission patient was tachycardic, tachypneic despite initial intravenous fluid resuscitation, she was admitted to the intensive care unit, she received broad-spectrum antibiotic therapy with cefepime and vancomycin.  Her cultures grew positive for Enterobacter Cloacae, from her blood and urine, which was resistant to cefazoline.  Antibiotic therapy was narrowed to cefepime and then to ciprofloxacin.  She had a complete 14-day course with good toleration.  2.  Nonoliguric acute kidney injury due to acute tubular necrosis.  Her kidney function continued to deteriorate, she reached a peak creatinine of 5.8, despite supportive medical therapy with intravenous fluids including serum bicarbonate infusion and diuretic therapy.  Decision was made to start patient on renal replacement therapy, on 10/21/18, she has received 3 consecutive days of hemodialysis with good toleration.  Patient will continue hemodialysis on a Tuesday Thursday Saturday schedule.  No permanent access, it is affected a possible renal recovery.  Volume status is -2158 mL.  Her discharge potassium is 4.3.  Will continue to hold olmesartan hydrochlorothiazide along with  sitagliptin/metformin.  Follow-up as an outpatient.  3.  Hypertension.  Patient will continue blood pressure control with amlodipine, clonidine and metoprolol.  Amlodipine has been  increased from 5 mg to 10 mg, will continue holding olmesartan hydrochlorothiazide for now.  4.  Type 2 diabetes mellitus.  Patient was continued on insulin therapy, sliding scale plus 14 units of Levemir daily, her glucose remained well controlled.  At discharge for continue with low-dose Levemir 10 units daily, plus insulin sliding sclae, hold on metformin sitagliptin combination.  5.  Metabolic encephalopathy related to sepsis.  Patient recover her cognition appropriately.  At discharge she is feeling back to her normal self.  6.  Obesity.  Her BMI is 37  Discharge Diagnoses:  Principal Problem:   Severe sepsis ([place]) Active Problems:   Benign essential HTN   Hypercholesterolemia without hypertriglyceridemia   Adult hypothyroidism   Diabetes mellitus type 2, uncontrolled ([place])   HLD (hyperlipidemia)    Discharge Instructions   Allergies as of 10/23/2018      Reactions   Macrobid [nitrofurantoin Macrocrystal] Rash      Medication List    STOP taking these medications   olmesartan-hydrochlorothiazide 40-25 MG tablet Commonly known as: BENICAR HCT   SitaGLIPtin-MetFORMIN HCl 50-1000 MG Tb24     TAKE these medications   ALPRAZolam 0.5 MG tablet Commonly known as: Xanax Take 1 tablet (0.5 mg total) by mouth 2 (two) times daily as needed for anxiety.   amLODipine 10 MG tablet Commonly known as: NORVASC Take 1 tablet (10 mg total) by mouth daily. What changed:   medication strength  how much to take   cloNIDine 0.2 MG tablet Commonly known as: CATAPRES TAKE ONE TABLET BY MOUTH TWICE A DAY   HUMALOG KWIKPEN [place] Inject 10 Units into the skin 2 (two) times a day.   Insulin Glargine 100 UNIT/ML Solostar Pen Commonly known as: LANTUS Inject 10 Units into the skin daily at 10 pm. What  changed: how much to take   Insulin Pen Needle 31G X 6 MM Misc 1 Device by Does not apply route 2 (two) times daily.   metoprolol tartrate 25 MG tablet Commonly known as: LOPRESSOR Take 0.5 tablets (12.5 mg total) by mouth 2 (two) times daily.   rosuvastatin 40 MG tablet Commonly known as: CRESTOR TAKE ONE TABLET BY MOUTH DAILY       Allergies  Allergen Reactions  . Macrobid [Nitrofurantoin Macrocrystal] Rash    Consultations:  Critical care  Nephrology   Procedures/Studies: Ct Abdomen Pelvis Wo Contrast  Result Date: 10/06/2018 CLINICAL DATA:  Altered mental status, nausea, vomiting and fevers since 10/05/2018. EXAM: CT ABDOMEN AND PELVIS WITHOUT CONTRAST TECHNIQUE: Multidetector CT imaging of the abdomen and pelvis was performed following the standard protocol without IV contrast. COMPARISON:  CT pelvis 07/04/2017. FINDINGS: Lower chest: There is cardiomegaly. Tiny right pleural effusion is noted. Minimal dependent atelectasis is present. Hepatobiliary: The liver is diffusely low attenuating and measures 20 cm craniocaudal. No focal liver lesion. Status post cholecystectomy. Biliary tree is unremarkable. Pancreas: Unremarkable. No pancreatic ductal dilatation or surrounding inflammatory changes. Spleen: Normal in size without focal abnormality. Adrenals/Urinary Tract: The adrenal glands appear normal. The right kidney appears edematous relative to the left. There is stranding about the right kidney and ureter. No stone is identified. Small phlebolith over the right psoas is unchanged compared to the prior CT. 0.4 cm nonobstructing stone left kidney noted. Urinary bladder appears normal. Stomach/Bowel: Stomach is within normal limits. Appendix appears normal. The appendix is not visualized but no evidence of appendicitis is seen. No evidence of bowel wall thickening, distention, or inflammatory changes. Vascular/Lymphatic: No significant vascular findings  are present. No enlarged  abdominal or pelvic lymph nodes. Reproductive: Status post hysterectomy. No adnexal masses. Other: There is a small volume of free pelvic fluid. Trace amount of fluid off the inferior margin of the liver is also identified. No focal fluid collection. Musculoskeletal: No fracture or focal bony lesion. Trace anterolisthesis L4 on L5 due to facet arthropathy noted. IMPRESSION: The right kidney appears edematous with stranding about the kidney and ureter most worrisome for pyelonephritis and possibly ureteritis. Negative for right urinary tract stone. 0.4 cm nonobstructing stone left kidney. Hepatomegaly and diffuse fatty infiltration of the liver. Cardiomegaly. Trace right pleural effusion. Electronically Signed   By: Inge Rise M.D.   On: 10/06/2018 15:44   Ct Head Wo Contrast  Result Date: 10/06/2018 CLINICAL DATA:  Nausea, vomiting and fever for 4 days. Confusion since 12 noon yesterday. EXAM: CT HEAD WITHOUT CONTRAST TECHNIQUE: Contiguous axial images were obtained from the base of the skull through the vertex without intravenous contrast. COMPARISON:  None. FINDINGS: Brain: No evidence of acute infarction, hemorrhage, hydrocephalus, extra-axial collection or mass lesion/mass effect. Vascular: No hyperdense vessel or unexpected calcification. Skull: Intact.  No focal lesion. Sinuses/Orbits: Status post cataract surgery.  Otherwise negative. Other: None. IMPRESSION: Negative head CT. Electronically Signed   By: Inge Rise M.D.   On: 10/06/2018 11:14   US Abdomen Complete  Result Date: 10/07/2018 CLINICAL DATA:  Abnormal LFTs EXAM: ABDOMEN ULTRASOUND COMPLETE COMPARISON:  None. FINDINGS: Gallbladder: Surgically absent. Common bile duct: Diameter: 3.3 mm Liver: Increased heterogeneous echotexture with no focal mass. Portal vein is patent on color Doppler imaging with normal direction of blood flow towards the liver. IVC: No abnormality visualized. Pancreas: Visualized portion unremarkable. Spleen:  Size and appearance within normal limits. Right Kidney: Length: 11.8 cm. Echogenicity within normal limits. No mass or hydronephrosis visualized. Left Kidney: Length: 11.3 cm. Contains a nonshadowing stone in the mid to lower pole. Abdominal aorta: Obscured distally by shadowing bowel gas. No aneurysm seen. Other findings: Small right pleural effusion.  Small ascites. IMPRESSION: 1. Small right pleural effusion and ascites. 2. Increased heterogeneous echotexture in the liver is nonspecific but often seen with hepatic steatosis. 3. No other abnormalities identified. Electronically Signed   By: Dorise Bullion III M.D   On: 10/07/2018 14:18   Ir Fluoro Guide Cv Line Right  Result Date: 10/22/2018 CLINICAL DATA:  Acute renal failure, needs venous access for hemodialysis EXAM: TUNNELED HEMODIALYSIS CATHETER PLACEMENT WITH ULTRASOUND AND FLUOROSCOPIC GUIDANCE COMPARISON:  None available TECHNIQUE: The procedure, risks, benefits, and alternatives were explained to the patient. Questions regarding the procedure were encouraged and answered. The patient understands and consents to the procedure. As antibiotic prophylaxis, cefazolin 2 g was ordered pre-procedure and administered intravenously within one hour of incision.Patency of the right IJ vein was confirmed with ultrasound with image documentation. An appropriate skin site was determined. Region was prepped using maximum barrier technique including cap and mask, sterile gown, sterile gloves, large sterile sheet, and Chlorhexidine as cutaneous antisepsis. The region was infiltrated locally with 1% lidocaine. Intravenous Fentanyl 89mcg and Versed 1mg  were administered as conscious sedation during continuous monitoring of the patient's level of consciousness and physiological / cardiorespiratory status by the radiology RN, with a total moderate sedation time of 12 minutes. Under real-time ultrasound guidance, the right IJ vein was accessed with a 21 gauge micropuncture  needle; the needle tip within the vein was confirmed with ultrasound image documentation. Needle exchanged over the 018 guidewire for transitional dilator, which  allowed advancement of a Benson wire into the IVC. Over this, an MPA catheter was advanced. A Palindrome 19 hemodialysis catheter was tunneled from the right anterior chest wall approach to the right IJ dermatotomy site. The MPA catheter was exchanged over an Amplatz wire for serial vascular dilators which allow placement of a peel-away sheath, through which the catheter was advanced under intermittent fluoroscopy, positioned with its tips in the proximal and midright atrium. Spot chest radiograph confirms good catheter position. No pneumothorax. Catheter was flushed and primed per protocol. Catheter secured externally with O Prolene sutures. The right IJ dermatotomy site was closed with Dermabond. COMPLICATIONS: COMPLICATIONS None immediate FLUOROSCOPY TIME:  2.4 minute; 373  uGym2 DAP IMPRESSION: 1. Technically successful placement of tunneled right IJ hemodialysis catheter with ultrasound and fluoroscopic guidance. Ready for routine use. ACCESS: Remains approachable for percutaneous intervention as needed. Electronically Signed   By: Lucrezia Europe M.D.   On: 10/22/2018 09:39   Ir US Guide Vasc Access Right  Result Date: 10/22/2018 CLINICAL DATA:  Acute renal failure, needs venous access for hemodialysis EXAM: TUNNELED HEMODIALYSIS CATHETER PLACEMENT WITH ULTRASOUND AND FLUOROSCOPIC GUIDANCE COMPARISON:  None available TECHNIQUE: The procedure, risks, benefits, and alternatives were explained to the patient. Questions regarding the procedure were encouraged and answered. The patient understands and consents to the procedure. As antibiotic prophylaxis, cefazolin 2 g was ordered pre-procedure and administered intravenously within one hour of incision.Patency of the right IJ vein was confirmed with ultrasound with image documentation. An appropriate skin  site was determined. Region was prepped using maximum barrier technique including cap and mask, sterile gown, sterile gloves, large sterile sheet, and Chlorhexidine as cutaneous antisepsis. The region was infiltrated locally with 1% lidocaine. Intravenous Fentanyl 65mcg and Versed 1mg  were administered as conscious sedation during continuous monitoring of the patient's level of consciousness and physiological / cardiorespiratory status by the radiology RN, with a total moderate sedation time of 12 minutes. Under real-time ultrasound guidance, the right IJ vein was accessed with a 21 gauge micropuncture needle; the needle tip within the vein was confirmed with ultrasound image documentation. Needle exchanged over the 018 guidewire for transitional dilator, which allowed advancement of a Benson wire into the IVC. Over this, an MPA catheter was advanced. A Palindrome 19 hemodialysis catheter was tunneled from the right anterior chest wall approach to the right IJ dermatotomy site. The MPA catheter was exchanged over an Amplatz wire for serial vascular dilators which allow placement of a peel-away sheath, through which the catheter was advanced under intermittent fluoroscopy, positioned with its tips in the proximal and midright atrium. Spot chest radiograph confirms good catheter position. No pneumothorax. Catheter was flushed and primed per protocol. Catheter secured externally with O Prolene sutures. The right IJ dermatotomy site was closed with Dermabond. COMPLICATIONS: COMPLICATIONS None immediate FLUOROSCOPY TIME:  2.4 minute; 373  uGym2 DAP IMPRESSION: 1. Technically successful placement of tunneled right IJ hemodialysis catheter with ultrasound and fluoroscopic guidance. Ready for routine use. ACCESS: Remains approachable for percutaneous intervention as needed. Electronically Signed   By: Lucrezia Europe M.D.   On: 10/22/2018 09:39   Dg Chest Port 1 View  Result Date: 10/10/2018 CLINICAL DATA:  Tachypnea EXAM:  PORTABLE CHEST 1 VIEW COMPARISON:  10/06/2018, 07/03/2017 FINDINGS: Low lung volumes. Streaky atelectasis or mild infiltrates at the bases. Cardiomegaly with slight central congestion. No pneumothorax. IMPRESSION: 1. Cardiomegaly with slight central congestion 2. Streaky atelectasis or minimal infiltrates at both bases Electronically Signed   By: Donavan Foil  M.D.   On: 10/10/2018 15:49   Dg Chest Port 1 View  Result Date: 10/06/2018 CLINICAL DATA:  Fever, tachypnea, history hypertension, diabetes mellitus EXAM: PORTABLE CHEST 1 VIEW COMPARISON:  Portable exam 0903 hours compared to 07/03/2017 FINDINGS: Borderline of cardiac silhouette, likely accentuated by low lung volumes and lordotic technique. Mediastinal contours and pulmonary vascularity normal. Hypoinflated lungs without infiltrate, pleural effusion or pneumothorax. Bones demineralized. IMPRESSION: No acute abnormalities. Electronically Signed   By: Lavonia Dana M.D.   On: 10/06/2018 09:39      Procedures: Right internal jugular tunneled hemodialysis catheter  Subjective: Patient is feeling better, no nausea or vomiting, no chest pain or dyspnea.   Discharge Exam: Vitals:   10/23/18 1100 10/23/18 1107  BP: (!) (P) 130/56 (!) (P) 129/57  Pulse: (!) (P) 107 (!) (P) 106  Resp: (P) 18 (P) 20  Temp: (P) 98.1 F (36.7 C)   SpO2: (P) 99%    Vitals:   10/23/18 0930 10/23/18 1000 10/23/18 1100 10/23/18 1107  BP: (!) 133/57 115/60 (!) (P) 130/56 (!) (P) 129/57  Pulse: (!) 101 100 (!) (P) 107 (!) (P) 106  Resp: 18 16 (P) 18 (P) 20  Temp:   (P) 98.1 F (36.7 C)   TempSrc:   (P) Oral   SpO2:   (P) 99%   Weight:   (P) 84.3 kg   Height:        General: Not in pain or dyspnea.  Neurology: Awake and alert, non focal  E ENT: no pallor, no icterus, oral mucosa moist Cardiovascular: No JVD. S1-S2 present, rhythmic, no gallops, rubs, or murmurs. +/++ pitting bilateral lower extremity edema. Pulmonary: positive breath sounds bilaterally,  adequate air movement, no wheezing, rhonchi or rales. Gastrointestinal. Abdomen with no organomegaly, non tender, no rebound or guarding Skin. No rashes Musculoskeletal: no joint deformities   The results of significant diagnostics from this hospitalization (including imaging, microbiology, ancillary and laboratory) are listed below for reference.     Microbiology: Recent Results (from the past 240 hour(s))  Culture, blood (routine x 2)     Status: None   Collection Time: 10/14/18  9:00 AM   Specimen: BLOOD  Result Value Ref Range Status   Specimen Description BLOOD RIGHT ANTECUBITAL  Final   Special Requests   Final    BOTTLES DRAWN AEROBIC ONLY Blood Culture results may not be optimal due to an inadequate volume of blood received in culture bottles   Culture   Final    NO GROWTH 5 DAYS Performed at [facility], [place] [address] [place], [place] [address]    Report Status 10/19/2018 FINAL  Final  Culture, blood (routine x 2)     Status: None   Collection Time: 10/14/18  9:08 AM   Specimen: BLOOD RIGHT HAND  Result Value Ref Range Status   Specimen Description BLOOD RIGHT HAND  Final   Special Requests   Final    BOTTLES DRAWN AEROBIC ONLY Blood Culture adequate volume   Culture   Final    NO GROWTH 5 DAYS Performed at [facility], [place] [address], [place], [place] [address]    Report Status 10/19/2018 FINAL  Final     Labs: BNP (last 3 results) No results for input(s): BNP in the last 8760 hours. Basic Metabolic Panel: Recent Labs  Lab 10/19/18 0649 10/19/18 1528 10/20/18 0159 10/21/18 0535 10/21/18 2203 10/22/18 0517 10/23/18 0502  NA 134* 133* 135 135 133* 136 133*  K 5.1 4.8 4.8 4.8 5.0 4.8 4.3  CL 102 100 102 99 98 100 99  CO2 22 22 23 22 22 24 25   GLUCOSE 146* 183* 87 123* 127* 136* 137*  BUN 76* 75* 75* 78* 78* 52* 32*  CREATININE 5.36* 5.44* 5.55* 5.82* 5.75* 4.59* 3.51*  CALCIUM 7.8* 7.8* 7.8* 7.9* 7.8* 7.7* 7.8*  MG 2.0 1.9  --  2.0  --   1.8 1.8  PHOS 6.7*  --  6.7* 7.1* 7.6* 5.8* 4.5   Liver Function Tests: Recent Labs  Lab 10/20/18 0159 10/21/18 0535 10/21/18 2203 10/22/18 0517 10/23/18 0502  ALBUMIN 1.6* 1.7* 1.6* 1.7* 1.7*   No results for input(s): LIPASE, AMYLASE in the last 168 hours. No results for input(s): AMMONIA in the last 168 hours. CBC: Recent Labs  Lab 10/19/18 0649 10/21/18 0535 10/21/18 2203 10/22/18 0517 10/23/18 0502  WBC 12.2* 12.1* 10.5 12.2* 11.1*  HGB 7.5* 8.0* 7.5* 7.1* 7.1*  HCT 23.3* 25.4* 23.6* 22.0* 22.4*  MCV 86.3 85.8 86.4 85.6 86.8  PLT 385 395 383 344 335   Cardiac Enzymes: No results for input(s): CKTOTAL, CKMB, CKMBINDEX, TROPONINI in the last 168 hours. BNP: Invalid input(s): POCBNP CBG: Recent Labs  Lab 10/22/18 0717 10/22/18 1129 10/22/18 1644 10/22/18 1717 10/22/18 2029  GLUCAP 153* 102* 64* 75 111*   D-Dimer No results for input(s): DDIMER in the last 72 hours. Hgb A1c No results for input(s): HGBA1C in the last 72 hours. Lipid Profile No results for input(s): CHOL, HDL, LDLCALC, TRIG, CHOLHDL, LDLDIRECT in the last 72 hours. Thyroid function studies No results for input(s): TSH, T4TOTAL, T3FREE, THYROIDAB in the last 72 hours.  Invalid input(s): FREET3 Anemia work up No results for input(s): VITAMINB12, FOLATE, FERRITIN, TIBC, IRON, RETICCTPCT in the last 72 hours. Urinalysis    Component Value Date/Time   COLORURINE YELLOW 10/18/2018 0958   APPEARANCEUR HAZY (A) 10/18/2018 0958   LABSPEC 1.009 10/18/2018 0958   PHURINE 6.0 10/18/2018 0958   GLUCOSEU 50 (A) 10/18/2018 0958   HGBUR MODERATE (A) 10/18/2018 0958   BILIRUBINUR NEGATIVE 10/18/2018 0958   BILIRUBINUR NEG 07/03/2017 1527   KETONESUR NEGATIVE 10/18/2018 0958   PROTEINUR 100 (A) 10/18/2018 0958   UROBILINOGEN 0.2 07/03/2017 1527   NITRITE NEGATIVE 10/18/2018 0958   LEUKOCYTESUR LARGE (A) 10/18/2018 0958   Sepsis Labs Invalid input(s): PROCALCITONIN,  WBC,   LACTICIDVEN Microbiology Recent Results (from the past 240 hour(s))  Culture, blood (routine x 2)     Status: None   Collection Time: 10/14/18  9:00 AM   Specimen: BLOOD  Result Value Ref Range Status   Specimen Description BLOOD RIGHT ANTECUBITAL  Final   Special Requests   Final    BOTTLES DRAWN AEROBIC ONLY Blood Culture results may not be optimal due to an inadequate volume of blood received in culture bottles   Culture   Final    NO GROWTH 5 DAYS Performed at [facility], [place] [address] [place], [place] [address]    Report Status 10/19/2018 FINAL  Final  Culture, blood (routine x 2)     Status: None   Collection Time: 10/14/18  9:08 AM   Specimen: BLOOD RIGHT HAND  Result Value Ref Range Status   Specimen Description BLOOD RIGHT HAND  Final   Special Requests   Final    BOTTLES DRAWN AEROBIC ONLY Blood Culture adequate volume   Culture   Final    NO GROWTH 5 DAYS Performed at [facility]  [facility], [place] [address], [place], [place] [address]    Report Status 10/19/2018 FINAL  Final     Time coordinating discharge: 45 minutes  SIGNED:   Tawni Millers, MD  [facility] 10/23/2018, 11:32 AM

## 2018-10-23 NOTE — TOC Initial Note (Signed)
Transition of Care [facility]) - Initial/Assessment Note    Patient Details  Name: Allison Patel MRN: [id number] Date of Birth: 1956-02-17  Transition of Care [facility]) CM/SW Contact:    Bartholomew Crews, RN Phone Number: [phone] 10/23/2018, 12:01 PM  Clinical Narrative:                 Spoke with patient at bedside about plans to transition home today. PTA home with spouse. Discussed starting outpatient HD on Tuesday and needing to sign paperwork on Monday. Patient verbalized understanding. Reinforced need to be careful of salt and fluids over holiday weekend. Patient verbalized understanding. Patient states that she feels safe going home with spouse who is able to assist her as needed. Patient declines DME at this time, but verbalized understanding that PCP - Dr. Renold Genta can order DME if needed. Patient is agreeable to [facility] PT/OT. Discussed that services would not start until next week, patient is agreeable stating her spouse is there to assist her. CM to follow up with home health agencies for transition of care needs.   Expected Discharge Plan: [place] Barriers to Discharge: No Barriers Identified   Patient Goals and CMS Choice   CMS Medicare.gov Compare Post Acute Care list provided to:: Patient Choice offered to / list presented to : Patient  Expected Discharge Plan and Services Expected Discharge Plan: [place]   Discharge Planning Services: CM Consult Post Acute Care Choice: Durable Medical Equipment, Home Health Living arrangements for the past 2 months: Single Family Home Expected Discharge Date: 10/23/18               DME Arranged: N/A(declined DME) DME Agency: NA                  Prior Living Arrangements/Services Living arrangements for the past 2 months: Single Family Home Lives with:: Self, Spouse Patient language and need for interpreter reviewed:: Yes Do you feel safe going back to the place where you live?: Yes      Need for  Family Participation in Patient Care: Yes (Comment) Care giver support system in place?: Yes (comment)   Criminal Activity/Legal Involvement Pertinent to Current Situation/Hospitalization: No - Comment as needed  Activities of Daily Living Home Assistive Devices/Equipment: None ADL Screening (condition at time of admission) Patient's cognitive ability adequate to safely complete daily activities?: Yes Is the patient deaf or have difficulty hearing?: No Does the patient have difficulty seeing, even when wearing glasses/contacts?: No Does the patient have difficulty concentrating, remembering, or making decisions?: No Patient able to express need for assistance with ADLs?: Yes Does the patient have difficulty dressing or bathing?: No Independently performs ADLs?: Yes (appropriate for developmental age) Does the patient have difficulty walking or climbing stairs?: No Weakness of Legs: None Weakness of Arms/Hands: None  Permission Sought/Granted                  Emotional Assessment Appearance:: Appears stated age Attitude/Demeanor/Rapport: Engaged Affect (typically observed): Accepting Orientation: : Oriented to Situation, Oriented to  Time, Oriented to Place, Oriented to Self Alcohol / Substance Use: Not Applicable Psych Involvement: No (comment)  Admission diagnosis:  Sepsis with acute renal failure and septic shock, due to unspecified organism, unspecified acute renal failure type ([place]) [A41.9, R65.21, N17.9] Patient Active Problem List   Diagnosis Date Noted  . HLD (hyperlipidemia) 10/15/2018  . Severe sepsis ([place]) 10/06/2018  . Benign essential HTN 03/14/2014  . Hypertrophic obstructive cardiomyopathy (  [place]) 03/14/2014  . Adiposity 03/14/2014  . Bone/cartilage disorder 03/14/2014  . Hypercholesterolemia without hypertriglyceridemia 03/14/2014  . Adult hypothyroidism 03/14/2014  . Diabetes mellitus type 2, uncontrolled ([place]) 03/14/2014   PCP:  Elby Showers,  MD Pharmacy:   [facility] [address] [place], [place]. [place]. [place] [address] Phone: [phone] Fax: [phone]  [facility] at [place], [place] [facility] [place] [address] [place] [address] Phone: [phone] Fax: [phone]     Social Determinants of Health (SDOH) Interventions    Readmission Risk Interventions No flowsheet data found.

## 2018-10-23 NOTE — TOC Transition Note (Addendum)
Transition of Care [facility]) - CM/SW Discharge Note   Patient Details  Name: Allison Patel MRN: [id number] Date of Birth: 18-Oct-1955  Transition of Care [facility]) CM/SW Contact:  Bartholomew Crews, RN Phone Number: [phone] 10/23/2018, 1:22 PM   Clinical Narrative:    Patient to transition home today. [place] to provide PT and OT services beginning late week next week. RN services not needed at this time, since patient will be going to HD center three times a week - MD in agreement. Patient now agreeable to RW - referral placed to [facility] to deliver to room. Spouse to pick up patient to transport home this afternoon. No other transition of care needs identified.    Final next level of care: Home w Home Health Services Barriers to Discharge: No Barriers Identified   Patient Goals and CMS Choice   CMS Medicare.gov Compare Post Acute Care list provided to:: Patient Choice offered to / list presented to : Patient  Discharge Placement                       Discharge Plan and Services   Discharge Planning Services: CM Consult Post Acute Care Choice: Durable Medical Equipment, Home Health          DME Arranged: [place] DME Agency: [facility] Date DME Agency Contacted: 10/23/18 Time DME Agency Contacted: [phone]   [place] Arranged: PT, OT HH Agency: [place] ([place]) Date [place]: 10/23/18 Time [place]: [place] Representative spoke with at [place]: [place] ([place]) Interventions     Readmission Risk Interventions No flowsheet data found.

## 2018-10-23 NOTE — Progress Notes (Signed)
Allison Patel to be discharged home per MD order. Discussed prescriptions and follow up appointments with the patient. Prescriptions given to patient; medication list explained in detail. Patient verbalized understanding.  Skin clean, dry and intact without evidence of skin break down, no evidence of skin tears noted. IV catheter discontinued intact. Site without signs and symptoms of complications. Dressing and pressure applied. Pt denies pain at the site currently. No complaints noted.  Patient free of lines, drains, and wounds.   An After Visit Summary (AVS) was printed and given to the patient. Patient escorted via wheelchair, and discharged home via private auto.  Shela Commons, RN

## 2018-10-24 LAB — HEPATITIS B SURFACE ANTIBODY,QUALITATIVE: Hep B S Ab: NONREACTIVE

## 2018-10-24 LAB — HEPATITIS B CORE ANTIBODY, TOTAL: Hep B Core Total Ab: NEGATIVE

## 2018-10-26 ENCOUNTER — Telehealth: Payer: Self-pay

## 2018-10-26 NOTE — Telephone Encounter (Signed)
She needs hospital follow upvisit. Is she able to come tomorrow?

## 2018-10-26 NOTE — Telephone Encounter (Addendum)
Scheduled.    Husband called office about 10 minutes before appt at 3:45 pm on July 7 for hospital follow up saying pt was weak and did not feel like coming. Home PT has been ordered.will check and see when appt is with Nephrology

## 2018-10-26 NOTE — Telephone Encounter (Signed)
PT from [place] called to let you know that patient was discharged on 10/23/18 after being in the hospital for about 2-3 weeks for severe sepsis. Patient's O2 room air is between 74 and 91. She has clear sputum. She is on dialysis Tuesday, Thursday and Saturday. They are requesting verbal orders for home PT 2 times a week for 3 weeks.

## 2018-10-27 ENCOUNTER — Ambulatory Visit: Payer: No Typology Code available for payment source | Admitting: Internal Medicine

## 2018-10-27 ENCOUNTER — Telehealth: Payer: Self-pay | Admitting: Internal Medicine

## 2018-10-27 NOTE — Telephone Encounter (Signed)
LVM to CB and reschedule appointment ASAP

## 2018-10-29 NOTE — Telephone Encounter (Signed)
Left detailed message, with okay verbal orders.

## 2018-10-29 NOTE — Telephone Encounter (Signed)
Approve PT.

## 2018-10-29 NOTE — Telephone Encounter (Signed)
[place] [phone]  Clair Gulling called back to see if they could go ahead and get verbal orders for PT

## 2018-10-29 NOTE — Telephone Encounter (Signed)
John called back and rescheduled

## 2018-10-30 ENCOUNTER — Ambulatory Visit (INDEPENDENT_AMBULATORY_CARE_PROVIDER_SITE_OTHER): Payer: PRIVATE HEALTH INSURANCE | Admitting: Internal Medicine

## 2018-10-30 ENCOUNTER — Encounter: Payer: Self-pay | Admitting: Internal Medicine

## 2018-10-30 ENCOUNTER — Ambulatory Visit
Admission: RE | Admit: 2018-10-30 | Discharge: 2018-10-30 | Disposition: A | Discharge status: Home / Self Care | Payer: PRIVATE HEALTH INSURANCE | Source: Ambulatory Visit | Attending: Internal Medicine | Admitting: Internal Medicine

## 2018-10-30 ENCOUNTER — Other Ambulatory Visit: Payer: Self-pay

## 2018-10-30 VITALS — BP 140/70 | HR 85 | Ht 59.5 in | Wt 183.0 lb

## 2018-10-30 DIAGNOSIS — E119 Type 2 diabetes mellitus without complications: Secondary | ICD-10-CM | POA: Diagnosis not present

## 2018-10-30 DIAGNOSIS — I10 Essential (primary) hypertension: Secondary | ICD-10-CM | POA: Diagnosis not present

## 2018-10-30 DIAGNOSIS — Z794 Long term (current) use of insulin: Secondary | ICD-10-CM

## 2018-10-30 DIAGNOSIS — R0902 Hypoxemia: Secondary | ICD-10-CM

## 2018-10-30 DIAGNOSIS — Z09 Encounter for follow-up examination after completed treatment for conditions other than malignant neoplasm: Secondary | ICD-10-CM

## 2018-10-30 DIAGNOSIS — N170 Acute kidney failure with tubular necrosis: Secondary | ICD-10-CM

## 2018-10-30 DIAGNOSIS — E1165 Type 2 diabetes mellitus with hyperglycemia: Secondary | ICD-10-CM

## 2018-10-30 DIAGNOSIS — R0602 Shortness of breath: Secondary | ICD-10-CM | POA: Diagnosis not present

## 2018-10-30 DIAGNOSIS — IMO0001 Reserved for inherently not codable concepts without codable children: Secondary | ICD-10-CM

## 2018-10-30 DIAGNOSIS — R7989 Other specified abnormal findings of blood chemistry: Secondary | ICD-10-CM

## 2018-10-30 DIAGNOSIS — A419 Sepsis, unspecified organism: Secondary | ICD-10-CM

## 2018-10-30 NOTE — Progress Notes (Signed)
Subjective:    Patient ID: Allison Patel, female    DOB: 05/28/1955, 63 y.o.   MRN: [id number]  HPI 63 year old Female for hospital follow-up at my request.  She has a history of insulin-dependent diabetes mellitus and is under the care of Dr. Buddy Duty.  Unfortunately she was admitted with confusion, hyponatremia, acidosis, hyperglycemia, acute renal failure and septicemia.  Blood culture tested positive for Enterobacter cloaca E and responded well to antibiotic therapy but kidney function deteriorated and she was started on hemodialysis.  Admitted on June 16 and discharged on July 3.  Remains on hemodialysis 3 days a week.  She is accompanied by her husband.  Is fatigued.  Apparently dialysis really makes her fatigued.  Patient was discharged home on July 3 but did not seek appointment until we called her.  Olmesartan and HCTZ were discontinued.  She was started on metoprolol for blood pressure and amlodipine was increased from 5 to 10 mg daily.  Remains on dialysis Tuesday Thursday and Saturdays.  Noted to be hypoxic in the office today at rest.  At time of discharge on July 3 creatinine was 3.51 with BUN of 32.  Currently she says Accu-Cheks are mostly ranging from 106- 170. Had a couple of reading in the 280's.  Needs follow-up with endocrinologist.  She has significant drop in O2 saturation with just walking down the hall and appears to be tachypneic with minimal exercise.  O2 sat at rest is 90%.  BMI is 36.34.  Blood pressure 140/70 and pulse is 85 and regular.  Has been assigned to Dr. Carolin Sicks at [facility].  EKG showed bifasciular block  Symtoms at onset of illness included fever up to 101 degrees nausea and vomiting.  Had eye exam Sept 2019.  Social history: She is a Technical sales engineer at [employer].  Husband is an Web designer in [employer].  She completed 2 years of college.  Does not smoke or consume alcohol.  She has 2 adult daughters.  She  resides in [place].  Family history: Mother died at age 70 with congestive heart failure.  Father died at age 7 with complications of pneumonia and heart failure.  Another brother in good health.  No sisters.  Old records indicates she has a history of osteopenia with stress fracture in her pelvis.  History of hypertrophic cardiomyopathy previously seen by Dr. Pernell Dupre.  She is status post TAH/BSO.  All records indicates she has a history of hypothyroidism and hyperlipidemia.  Her TSH was normal on June 16.  Review of Systems appetite is fairly good.  Reports no fever chills dysuria nausea or vomiting.     Objective:   Physical Exam Vital signs above.  She is tachycardic.  Cardiac exam regular rate and rhythm without gallops.  Pitting lower extremity edema.  She is alert and oriented.  Chest exam reveals coarse breath sounds at both bases.  She was sent for chest x-ray.  Chest x-ray showed mild bilateral pleural effusions left greater than right.  BNP was drawn with result being 305.  TSH elevated at 5.18.  She has no prior history of hypothyroidism to my knowledge and will be started on low-dose Synthroid with follow-up in 6 to 8 weeks.  Hemoglobin A1c 9.4% and previously was 14% some 3 weeks ago and was greater than 14% a year ago.       Assessment & Plan:  Status post hospitalization for septicemia with Enterobacter cloaca E presumably from UTI  with AKA requiring hemodialysis  Insulin-dependent diabetes mellitus with history of poor control.  Needs appointment with Dr. Buddy Duty.  Bibasilar effusions?  CHF.  BNP drawn and was in the low 300s.  We will arrange for patient to see cardiologist.  She would like to see Dr. Angelena Form who took care of her mother.  There is a family history of heart disease in both her mother and father.  Elevated TSH-will be started on low-dose levothyroxine 0.05 mg and needs follow-up in 6 weeks.  History of hypothyroidism per old records.  TSH was normal on  admission.  Likely has not been taking thyroid replacement medication.  Patient's husband came and picked up a copy of chest x-ray report to take to hemodialysis tomorrow.  I am concerned about her hypoxia.  We attempted to call [facility] and they indicated that dialysis would take care of the issue.  If we think the patient is having significant respiratory distress she is advised to go to the emergency department.  Patient was told of this disposition.  She appears to have an element of congestive heart failure and we will be referring her to cardiologist.  Old records indicate she has hypertrophic cardiomyopathy.  45 minutes spent with patient and her husband in addition to reviewing her recent complicated hospitalization, making telephone call to [facility] and arranging for further evaluation.

## 2018-10-31 LAB — TSH: TSH: 5.18 mIU/L — ABNORMAL HIGH (ref 0.40–4.50)

## 2018-10-31 LAB — HEMOGLOBIN A1C
Hgb A1c MFr Bld: 9.4 % of total Hgb — ABNORMAL HIGH (ref <5.7)
Mean Plasma Glucose: 223 (calc)
eAG (mmol/L): 12.4 (calc)

## 2018-10-31 LAB — BRAIN NATRIURETIC PEPTIDE: Brain Natriuretic Peptide: 305 pg/mL — ABNORMAL HIGH (ref <100)

## 2018-11-02 ENCOUNTER — Telehealth: Payer: Self-pay

## 2018-11-02 ENCOUNTER — Encounter: Payer: Self-pay | Admitting: Internal Medicine

## 2018-11-02 NOTE — Telephone Encounter (Signed)
Attempted to call pt. Got voice mail and left message. She should advise Nephrologist/kidney Center about this low O2 sat reading. We did call [facility] last week. They said dialysis would be taking care of pt. We faxed them a copy of her CXR. They did not want to give [place] an appt at [facility]. Said to send pt back to ED if not doing well. We then had pt take a copy of her CXR report to dialysis on Saturday.  Left voice mail to Physical Therapist today with above info.

## 2018-11-02 NOTE — Telephone Encounter (Signed)
Beth from [place] [facility] called states patient's o2 resting was between 70s.  Call back number [phone]

## 2018-11-02 NOTE — Patient Instructions (Addendum)
Labs drawn today include BNP, Hgb AIC and TSH.  Begin levothyroxine 0.05 mg daily and follow-up here in 6 weeks.  Please be sure and take chest x-ray report with you to dialysis tomorrow.  We will arrange for cardiology follow-up regarding history of hypertrophic cardiomyopathy and symptoms of congestive heart failure today.  If you get extremely short of breath please return to hospital for evaluation.  Home health has been ordered for physical therapy services.  Continue dialysis 3 days weekly.  Follow-up with Dr. Buddy Duty for diabetes management.

## 2018-11-04 ENCOUNTER — Other Ambulatory Visit: Payer: Self-pay

## 2018-11-04 MED ORDER — LEVOTHYROXINE SODIUM 50 MCG PO TABS: 50.0000 ug | ORAL_TABLET | Freq: Every day | ORAL | 1 refills | Status: DC

## 2018-11-10 ENCOUNTER — Telehealth: Payer: Self-pay | Admitting: Internal Medicine

## 2018-11-10 NOTE — Telephone Encounter (Signed)
LVM that Dialysis should be handling that.

## 2018-11-10 NOTE — Telephone Encounter (Signed)
Allison Patel [phone]  Caryl Pina called to inquire about her moms dry croupy cough, she still has it and she was wandering if she will need another xray and if so would it be order thru you or dialysis. She is concerned and wants to stay on top of things where her mom is concerned.

## 2018-11-10 NOTE — Telephone Encounter (Signed)
We sent the CXR from July 10 results to Dialysis with [facility]. They should be handling this.

## 2018-11-13 ENCOUNTER — Telehealth: Payer: Self-pay | Admitting: Internal Medicine

## 2018-11-13 NOTE — Telephone Encounter (Signed)
[place]  Allison Patel called to say they were discontinuing their services per patient request, However she did agree to a nursing evaluation and he would like an verbal order for that. Okay to LVM on above listed phone number.

## 2018-11-13 NOTE — Telephone Encounter (Signed)
Call them for nursing order OK

## 2018-11-13 NOTE — Telephone Encounter (Signed)
Gave verbal orders Clair Gulling.

## 2018-11-16 DIAGNOSIS — N170 Acute kidney failure with tubular necrosis: Secondary | ICD-10-CM | POA: Diagnosis not present

## 2018-11-17 ENCOUNTER — Telehealth: Payer: Self-pay | Admitting: Internal Medicine

## 2018-11-17 ENCOUNTER — Other Ambulatory Visit: Payer: Self-pay | Admitting: Internal Medicine

## 2018-11-17 MED ORDER — METOPROLOL TARTRATE 25 MG PO TABS: 12.5000 mg | ORAL_TABLET | Freq: Two times a day (BID) | ORAL | 5 refills | Status: DC

## 2018-11-17 MED ORDER — AMLODIPINE BESYLATE 10 MG PO TABS: 10.0000 mg | ORAL_TABLET | Freq: Every day | ORAL | 5 refills | Status: DC

## 2018-11-17 NOTE — Telephone Encounter (Signed)
Please advise, thank you.

## 2018-11-17 NOTE — Telephone Encounter (Signed)
Allison Patel [phone]  [facility]  amLODipine (NORVASC) 10 MG tablet   metoprolol tartrate (LOPRESSOR) 25 MG tablet  Instead of taking 1/2 tablet twice a day she is taking 1 tablet once a day. They are too difficult to spilt because they are so small.  John called to see if you would take over these medications that was prescribed at the hospital or should [facility] fill them?

## 2018-11-17 NOTE — Telephone Encounter (Signed)
[place]  Amy called to say she went out to Mrs Birks's home for a nursing evaluation and her 30 was 45. Amy wants to know if nursing is still needed.

## 2018-11-17 NOTE — Telephone Encounter (Signed)
Patient is supposed to take metoprolol 25mg  1/2 a tab twice a day,  instead she is taking one pill once a day is that okay for patient to continue to do?

## 2018-11-17 NOTE — Telephone Encounter (Signed)
I called [name]. Pt stable and improved. I had been informed by dialysis PA that they would be taking some additional fluid off during dialysis so this has seemingly helped O2 sat.  Home health services to be discontinued. [name] will handle that.

## 2018-12-10 ENCOUNTER — Other Ambulatory Visit: Payer: Self-pay

## 2018-12-10 MED ORDER — CLONIDINE HCL 0.2 MG PO TABS: 0.2000 mg | ORAL_TABLET | Freq: Two times a day (BID) | ORAL | 0 refills | Status: DC

## 2019-01-31 ENCOUNTER — Other Ambulatory Visit: Payer: Self-pay | Admitting: Internal Medicine

## 2019-02-01 NOTE — Telephone Encounter (Signed)
Needs appt before refilling. Not seen since July and some labs need to be addressed. Find out if [facility] and TSH have been checked recently by her Endocrinologist.

## 2019-02-01 NOTE — Telephone Encounter (Signed)
Left message to call me back to book appt. Last A1C was done in July.

## 2019-02-02 ENCOUNTER — Other Ambulatory Visit: Payer: Self-pay

## 2019-02-02 ENCOUNTER — Encounter: Payer: Self-pay | Admitting: Internal Medicine

## 2019-02-02 ENCOUNTER — Ambulatory Visit (INDEPENDENT_AMBULATORY_CARE_PROVIDER_SITE_OTHER): Payer: PRIVATE HEALTH INSURANCE | Admitting: Internal Medicine

## 2019-02-02 VITALS — BP 148/72 | HR 110 | Temp 98.0°F | Ht 59.5 in | Wt 143.0 lb

## 2019-02-02 DIAGNOSIS — R600 Localized edema: Secondary | ICD-10-CM

## 2019-02-02 DIAGNOSIS — E109 Type 1 diabetes mellitus without complications: Secondary | ICD-10-CM

## 2019-02-02 DIAGNOSIS — N1831 Chronic kidney disease, stage 3a: Secondary | ICD-10-CM

## 2019-02-02 DIAGNOSIS — E785 Hyperlipidemia, unspecified: Secondary | ICD-10-CM

## 2019-02-02 DIAGNOSIS — E1169 Type 2 diabetes mellitus with other specified complication: Secondary | ICD-10-CM

## 2019-02-02 DIAGNOSIS — I10 Essential (primary) hypertension: Secondary | ICD-10-CM

## 2019-02-02 DIAGNOSIS — E039 Hypothyroidism, unspecified: Secondary | ICD-10-CM

## 2019-02-02 DIAGNOSIS — E1165 Type 2 diabetes mellitus with hyperglycemia: Secondary | ICD-10-CM

## 2019-02-02 MED ORDER — CLONIDINE HCL 0.2 MG PO TABS: 0.2000 mg | ORAL_TABLET | Freq: Two times a day (BID) | ORAL | 3 refills | Status: DC

## 2019-02-02 MED ORDER — AMLODIPINE BESYLATE 10 MG PO TABS: 10.0000 mg | ORAL_TABLET | Freq: Every day | ORAL | 3 refills | Status: DC

## 2019-02-02 NOTE — Progress Notes (Signed)
   Subjective:    Patient ID: Allison Patel, female    DOB: [birth year], 63 y.o.   MRN: [id number]  HPI 63 year old Female here for follow up of HTN and DM   Was on hemodialysis until about mid August. Admitted with septicemia in July 1 to August 13  Had increase in lasix in August to 80 mg q am and 40 mg at supper  Creatinine was 2.44  Had hyperphosphatemia treated with Lorin Picket which has been discontinued in early October  In July, Hemoglobin AIc 9.4% and was 14% in June  Hx hypothyroidism on low dose Synthroid  Had flu vaccine at work Reminded about diabetic eye exam  Review of Systems no new complaints and has not seen Dr. Buddy Duty recently     Objective:   Physical Exam Vital signs reviewed.  Skin warm and dry.  Neck is supple without JVD thyromegaly or carotid bruits.  Chest clear to auscultation.  Cardiac exam regular rate and rhythm.  Pitting edema of lower extremities. Pulse is 110.  Patient appears anxious.  Blood pressure 148/72.  Pulse oximetry 94%      Assessment & Plan:  Creatinine continues to be elevated at 2.24 and is followed by [facility].  Was hospitalized with sepsis and acute kidney injury.  Has significant lower extremity edema treated with high-dose Lasix.  -was on hemodialysis from July 1 until August 13 for acute kidney failure related to sepsis  Type 2 diabetes with chronic kidney disease-unfortunately diabetes is currently poorly controlled with hemoglobin A1c 11.2% and previously was 9.4% 4 months ago.  History of hyperphosphatemia now taking calcium acetate  Anemia of chronic kidney disease-current creatinine 2.29 improved from 5.5 in June 2020  Hypothyroidism diagnosed with hypothyroidism in July and is on low-dose thyroid replacement with normal TSH.  4 months ago before starting medication TSH was 5.18  Hypertension-blood pressure 148/72 and pulse 110.  Weight 143 pounds with BMI 28.40.  Having issues with dependent edema which is  probably aggravating blood pressure  Lower extremity edema related to kidney disease and treated with high-dose Lasix  Obesity-needs to watch diet and get some exercise  Plan: Please see Dr. Buddy Duty in the near future regarding diabetic management.  Continue current medications.  Continue follow-up with Nephrology.  Health maintenance exam due after March 14.  Follow-up here in 3 to 6 months.  I am concerned she is not taking care of herself

## 2019-02-03 ENCOUNTER — Telehealth: Payer: Self-pay | Admitting: Internal Medicine

## 2019-02-03 LAB — BASIC METABOLIC PANEL
BUN/Creatinine Ratio: 31 (calc) — ABNORMAL HIGH (ref 6–22)
BUN: 70 mg/dL — ABNORMAL HIGH (ref 7–25)
CO2: 30 mmol/L (ref 20–32)
Calcium: 10.3 mg/dL (ref 8.6–10.4)
Chloride: 94 mmol/L — ABNORMAL LOW (ref 98–110)
Creat: 2.29 mg/dL — ABNORMAL HIGH (ref 0.50–0.99)
Glucose, Bld: 304 mg/dL — ABNORMAL HIGH (ref 65–139)
Potassium: 4.1 mmol/L (ref 3.5–5.3)
Sodium: 135 mmol/L (ref 135–146)

## 2019-02-03 LAB — HEMOGLOBIN A1C
Hgb A1c MFr Bld: 11.2 % of total Hgb — ABNORMAL HIGH (ref <5.7)
Mean Plasma Glucose: 275 (calc)
eAG (mmol/L): 15.2 (calc)

## 2019-02-03 LAB — TSH: TSH: 2.2 mIU/L (ref 0.40–4.50)

## 2019-02-03 NOTE — Telephone Encounter (Signed)
Faxed lab results to [facility] fax [phone] and Dr Delrae Rend fax [phone]

## 2019-02-21 ENCOUNTER — Other Ambulatory Visit: Payer: Self-pay | Admitting: Internal Medicine

## 2019-03-20 NOTE — Patient Instructions (Signed)
Please try to get diabetes under better control.  Please promised to see Dr. Buddy Duty in the near future.  Continue thyroid replacement medication.  Try to walk some for exercise.

## 2019-05-09 ENCOUNTER — Other Ambulatory Visit: Payer: Self-pay | Admitting: Internal Medicine

## 2019-10-12 ENCOUNTER — Telehealth: Payer: Self-pay

## 2019-10-12 NOTE — Telephone Encounter (Signed)
Left message needs to schedule her physical. It was due in March. FYI

## 2019-12-02 ENCOUNTER — Encounter: Payer: No Typology Code available for payment source | Attending: Internal Medicine | Admitting: Dietician

## 2019-12-02 ENCOUNTER — Encounter: Payer: Self-pay | Admitting: Dietician

## 2019-12-02 ENCOUNTER — Other Ambulatory Visit: Payer: Self-pay

## 2019-12-02 DIAGNOSIS — E1165 Type 2 diabetes mellitus with hyperglycemia: Secondary | ICD-10-CM | POA: Diagnosis not present

## 2019-12-02 NOTE — Patient Instructions (Addendum)
Consider discussing the Dexcom or the FreeStyle [place] with your MD/insurance.  Kidney guideance and resources: Follow a low sodium diet. Consider a low sodium meat for a sandwich Limit bacon Avoid/limit foods with added phos. No need to restrict potassium unless your potassium is elevated. Avoid seasonings with added potassium. Protein intake should be low. Choose fats that are lower in saturated fat  [email or web address] [email or web address].org  Remember to take your insulin regularly. Rethink what you drink to avoid beverages without carbohydrates.

## 2019-12-02 NOTE — Progress Notes (Signed)
Diabetes Self-Management Education  Visit Type: First/Initial  Appt. Start Time: 0910 Appt. End Time: 1020  12/02/2019  [name], identified by name and date of birth, is a 64 y.o. female with a diagnosis of Diabetes: Type 2.   ASSESSMENT Patient is here today alone.  She would like accountability and would like to learn more about what to eat.  She states that she has been looking on the Internet.  She would also need more information about a renal diet.  History includes Type 2 diabetes, hypothyroid, HLD, HTN, Acute kidney injury.  She stated that last summer, she was admitted to the [facility] with sepsis from UTI.  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  Nephrologist:  Dr. Clover Patel. Medications include:  Lantus 30 units q HS, Humalog 15 units before meals (adjusted based on blood sugar and what she is eating.) She forgets Humalog at times especially at lunch while at work and in the am when rushing. A1C 13.3% 10/11/19 increased from 11.5% 05/21/2019, eGFR 27 06/25/19  Weight hx: UBW 162 lbs and 167 lbs today.  She has lost about 40 lbs in the past 5 years.  She lives with her husband.  They share shopping.  They often fix very easy things.  Her husband has type 2 and needs to lose weight. Her husband only likes starchy vegetables and salad and this effects her eating. She rarely uses added salt. Eats out twice per week ([employer]). She works at [employer].  Stress is high and they are short staffed. She used to walk but they do not now.  Height 5' (1.524 m), weight 167 lb (75.8 kg). Body mass index is 32.61 kg/m.   Diabetes Self-Management Education - 12/02/19 0932      Visit Information   Visit Type First/Initial      Initial Visit   Diabetes Type Type 2    Are you currently following a meal plan? No    Are you taking your medications as prescribed? Yes    Date Diagnosed 2015      Health Coping   How would you rate your overall  health? Fair      Psychosocial Assessment   Patient Belief/Attitude about Diabetes Motivated to manage diabetes    Self-care barriers None    Self-management support Doctor's office    Other persons present Patient    Patient Concerns Nutrition/Meal planning    Special Needs None    Preferred Learning Style No preference indicated    Learning Readiness Ready    How often do you need to have someone help you when you read instructions, pamphlets, or other written materials from your doctor or pharmacy? 1 - Never    What is the last grade level you completed in school? Associates degree      Pre-Education Assessment   Patient understands the diabetes disease and treatment process. Needs Instruction    Patient understands incorporating nutritional management into lifestyle. Needs Instruction    Patient undertands incorporating physical activity into lifestyle. Needs Instruction    Patient understands using medications safely. Needs Instruction    Patient understands monitoring blood glucose, interpreting and using results Needs Instruction    Patient understands prevention, detection, and treatment of acute complications. Needs Instruction    Patient understands prevention, detection, and treatment of chronic complications. Needs Instruction    Patient understands how to develop strategies to address psychosocial issues. Needs Instruction    Patient  understands how to develop strategies to promote health/change behavior. Needs Instruction      Complications   Last HgB A1C per patient/outside source 13.3 %   10/11/19 increased from 11.5% 05/21/2019   How often do you check your blood sugar? 3-4 times / week   forgets to check her blood sugar   Fasting Blood glucose range (mg/dL) >200;180-200    Postprandial Blood glucose range (mg/dL) 180-200    Number of hypoglycemic episodes per month 0    Number of hyperglycemic episodes per week 21    Can you tell when your blood sugar is high? Yes     What do you do if your blood sugar is high? increases the Humalog    Have you had a dilated eye exam in the past 12 months? No   appointment scheduled   Have you had a dental exam in the past 12 months? Yes    Are you checking your feet? Yes    How many days per week are you checking your feet? 7      Dietary Intake   Breakfast 2 1/2 strips precooked bacon, melon    Snack (morning) apple slices with peanut butter   10   Lunch leftovers OR boar's head chicken sandwich on white or honey wheat OR Lean cuisine or Atkin's frozen meal    Snack (afternoon) none    Dinner sandwich OR chicken casserole, occasional vegetable (peas or potatoes or corn)   6-6:30   Snack (evening) mini candy bar (1-2)    Beverage(s) water, sweet tea with dinner, occasional coffee with coffee mate      Exercise   Exercise Type ADL's    How many days per week to you exercise? 0    How many minutes per day do you exercise? 0    Total minutes per week of exercise 0      Patient Education   Previous Diabetes Education No    Disease state  Definition of diabetes, type 1 and 2, and the diagnosis of diabetes    Nutrition management  Role of diet in the treatment of diabetes and the relationship between the three main macronutrients and blood glucose level;Meal options for control of blood glucose level and chronic complications.;Other (comment)   low sodium, low phosphorous   Physical activity and exercise  Role of exercise on diabetes management, blood pressure control and cardiac health.;Helped patient identify appropriate exercises in relation to his/her diabetes, diabetes complications and other health issue.    Medications Reviewed patients medication for diabetes, action, purpose, timing of dose and side effects.   provided information on the Dexcom per patient request   Monitoring Identified appropriate SMBG and/or A1C goals.;Purpose and frequency of SMBG.;Daily foot exams;Yearly dilated eye exam;Other (comment)     Acute complications Taught treatment of hypoglycemia - the 15 rule.    Chronic complications Relationship between chronic complications and blood glucose control    Psychosocial adjustment Identified and addressed patients feelings and concerns about diabetes      Individualized Goals (developed by patient)   Nutrition General guidelines for healthy choices and portions discussed    Physical Activity Exercise 3-5 times per week;15 minutes per day    Medications take my medication as prescribed    Monitoring  test my blood glucose as discussed    Reducing Risk increase portions of healthy fats;examine blood glucose patterns;do foot checks daily;treat hypoglycemia with 15 grams of carbs if blood glucose less than 27m/dL  Health Coping discuss diabetes with (comment)   MD, RD, CDCES     Post-Education Assessment   Patient understands the diabetes disease and treatment process. Demonstrates understanding / competency    Patient understands incorporating nutritional management into lifestyle. Needs Review    Patient undertands incorporating physical activity into lifestyle. Needs Review    Patient understands using medications safely. Demonstrates understanding / competency    Patient understands monitoring blood glucose, interpreting and using results Demonstrates understanding / competency    Patient understands prevention, detection, and treatment of acute complications. Demonstrates understanding / competency    Patient understands prevention, detection, and treatment of chronic complications. Demonstrates understanding / competency    Patient understands how to develop strategies to address psychosocial issues. Demonstrates understanding / competency    Patient understands how to develop strategies to promote health/change behavior. Needs Review      Outcomes   Expected Outcomes Demonstrated interest in learning. Expect positive outcomes    Future DMSE 4-6 wks    Program Status Completed            Individualized Plan for Diabetes Self-Management Training:   Learning Objective:  Patient will have a greater understanding of diabetes self-management. Patient education plan is to attend individual and/or group sessions per assessed needs and concerns.   Plan:   Patient Instructions  Consider discussing the Dexcom or the FreeStyle [place] with your MD/insurance.  Kidney guideance and resources: Follow a low sodium diet. Consider a low sodium meat for a sandwich Limit bacon Avoid/limit foods with added phos. No need to restrict potassium unless your potassium is elevated. Avoid seasonings with added potassium. Protein intake should be low. Choose fats that are lower in saturated fat  [email or web address]  Remember to take your insulin regularly. Rethink what you drink to avoid beverages without carbohydrates.    Expected Outcomes:  Demonstrated interest in learning. Expect positive outcomes  Education material provided: ADA - How to Thrive: A Guide for Your Journey with Diabetes, Meal plan card and Snack sheet; NKD National Kidney Diet placemat  If problems or questions, patient to contact team via:  Phone  Future DSME appointment: 4-6 wks

## 2019-12-29 ENCOUNTER — Other Ambulatory Visit: Payer: Self-pay | Admitting: Internal Medicine

## 2019-12-30 ENCOUNTER — Encounter: Payer: PRIVATE HEALTH INSURANCE | Attending: Internal Medicine | Admitting: Dietician

## 2019-12-30 ENCOUNTER — Encounter: Payer: Self-pay | Admitting: Dietician

## 2019-12-30 ENCOUNTER — Other Ambulatory Visit: Payer: Self-pay

## 2019-12-30 DIAGNOSIS — E1165 Type 2 diabetes mellitus with hyperglycemia: Secondary | ICD-10-CM | POA: Diagnosis present

## 2019-12-30 NOTE — Patient Instructions (Addendum)
Keep up the great changes, being more mindful of your food choices.  You are seeing positive results with your blood sugar because of this!  Consider alternatives for lunch.  Frozen meals should contain only about 600 mg sodium, less than 15 grams of fat, and less than 20 grams protein ideally.  Consider ways to be more active.  Walking 15 minutes most days of the week for instance.  Remember your insulin before lunch. Rethink the tea.  Great job reducing the sugar.  Consider asking your insurance about the Dexcom and FreeStyle Libre CGM.

## 2019-12-30 NOTE — Progress Notes (Signed)
Diabetes Self-Management Education  Visit Type: Follow-up  Appt. Start Time: 1215 Appt. End Time: 1245  12/30/2019  [name], identified by name and date of birth, is a 64 y.o. female with a diagnosis of Diabetes: Type 2.   ASSESSMENT Patient is here today alone.  She was last seen by myself 12/02/2019.  I recommended follow up and will be seen again in November.  Requested that she bring a copy of her lab work from Dr. Clover Mealy (nephrologist)  Patient states that she has been doing better and trying to make better food choices.  She states that she has tried not to eat too late at night.   Being more mindful of foods when she is eating out. Has not exercised but her and her husband have discussed getting a treadmill. Continues to have increased stress at work. Sleep:  "Never a good sleeper but improved and now sleeping 6-7 hours per night" She reports improvement of blood sugar readings during the day but fasting blood sugar remains elevated 180-210. She has bee eating prepared frozen meals which are high in fat, sodium and protein - discussed alternatives. Continues to take Lantus 30 units once daily and Humalog.  She states that she forgets this at times before lunch.  History includes Type 2 diabetes, hypothyroid, HLD, HTN, Acute kidney injury.  She stated that last summer, she was admitted to the [facility] with sepsis from UTI.  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  Nephrologist:  Dr. Clover Mealy. Medications include:  Lantus 30 units q HS, Humalog 15 units before meals (adjusted based on blood sugar and what she is eating.) She forgets Humalog at times especially at lunch while at work and in the am when rushing. A1C 13.3% 10/11/19 increased from 11.5% 05/21/2019, eGFR 27 06/25/19  Weight hx: UBW 162 lbs and 169 lbs today which is increased 2 lbs in the past month.  She has lost about 40 lbs in the past 5 years.  She lives with her husband.   They share shopping.  They often fix very easy things.  Her husband has type 2 diabetes and needs to lose weight. Her husband only likes starchy vegetables and salad and this effects her eating. She rarely uses added salt. Eats out twice per week ([employer]). She works at [employer].  Stress is high and they are short staffed. She used to walk but they do not now.  She reports improvement of blood sugar readings during the day but fasting blood sugar remains elevated 180-210.  Weight 169 lb (76.7 kg). Body mass index is 33.01 kg/m.   Diabetes Self-Management Education - 12/30/19 2005      Visit Information   Visit Type Follow-up      Initial Visit   Diabetes Type Type 2    Are you taking your medications as prescribed? Yes      Psychosocial Assessment   Patient Belief/Attitude about Diabetes Motivated to manage diabetes    Self-care barriers None    Self-management support Doctor's office    Other persons present Patient    Patient Concerns Nutrition/Meal planning;Glycemic Control;Weight Control    Special Needs None    Preferred Learning Style No preference indicated    Learning Readiness Ready    How often do you need to have someone help you when you read instructions, pamphlets, or other written materials from your doctor or pharmacy? 1 - Never      Pre-Education  Assessment   Patient understands the diabetes disease and treatment process. Demonstrates understanding / competency    Patient understands incorporating nutritional management into lifestyle. Needs Review    Patient undertands incorporating physical activity into lifestyle. Demonstrates understanding / competency    Patient understands using medications safely. Demonstrates understanding / competency    Patient understands monitoring blood glucose, interpreting and using results Demonstrates understanding / competency    Patient understands prevention, detection, and treatment of acute complications. Demonstrates  understanding / competency    Patient understands prevention, detection, and treatment of chronic complications. Demonstrates understanding / competency    Patient understands how to develop strategies to address psychosocial issues. Needs Review    Patient understands how to develop strategies to promote health/change behavior. Needs Review      Complications   How often do you check your blood sugar? 1-2 times/day    Fasting Blood glucose range (mg/dL) >200    Postprandial Blood glucose range (mg/dL) 70-129;130-179    Number of hypoglycemic episodes per month 0    Number of hyperglycemic episodes per week 7      Dietary Intake   Breakfast scrambled egg,  2 strips bacon, 2 slices toast    Snack (morning) apple slices with PB    Lunch leftovers or Atkin's frozen meal    Snack (afternoon) none    Dinner grilled tilapia, broccoli, salad    Snack (evening) none    Beverage(s) water, 1 glass sweet tea with dinner, occasional coffee with coffee mate      Exercise   Exercise Type ADL's      Patient Education   Previous Diabetes Education Yes (please comment)   11/2019   Nutrition management  Information on hints to eating out and maintain blood glucose control.;Meal options for control of blood glucose level and chronic complications.;Other (comment)   low sodium options   Physical activity and exercise  Role of exercise on diabetes management, blood pressure control and cardiac health.    Medications Reviewed patients medication for diabetes, action, purpose, timing of dose and side effects.    Monitoring Purpose and frequency of SMBG.;Identified appropriate SMBG and/or A1C goals.;Other (comment)   causes of high am blood glucose, option of continuous glucose monitoring   Psychosocial adjustment Identified and addressed patients feelings and concerns about diabetes      Individualized Goals (developed by patient)   Nutrition General guidelines for healthy choices and portions discussed     Physical Activity Exercise 3-5 times per week;15 minutes per day    Medications take my medication as prescribed    Monitoring  test my blood glucose as discussed    Problem Solving reducing sodium    Reducing Risk Other (comment)   reducing sodium   Health Coping discuss diabetes with (comment)   MD, RD, CDCES     Patient Self-Evaluation of Goals - Patient rates self as meeting previously set goals (% of time)   Nutrition 50 - 75 %    Physical Activity < 25%    Medications >75%    Monitoring >75%    Problem Solving >75%    Reducing Risk 50 - 75 %    Health Coping >75%      Post-Education Assessment   Patient understands the diabetes disease and treatment process. Demonstrates understanding / competency    Patient understands incorporating nutritional management into lifestyle. Needs Review    Patient undertands incorporating physical activity into lifestyle. Needs Review    Patient understands  using medications safely. Demonstrates understanding / competency    Patient understands monitoring blood glucose, interpreting and using results Demonstrates understanding / competency    Patient understands prevention, detection, and treatment of acute complications. Demonstrates understanding / competency    Patient understands prevention, detection, and treatment of chronic complications. Demonstrates understanding / competency    Patient understands how to develop strategies to address psychosocial issues. Needs Review    Patient understands how to develop strategies to promote health/change behavior. Needs Review      Outcomes   Expected Outcomes Demonstrated interest in learning. Expect positive outcomes    Future DMSE 2 months    Program Status Not Completed      Subsequent Visit   Since your last visit have you continued or begun to take your medications as prescribed? Yes    Since your last visit have you experienced any weight changes? Gain    Weight Gain (lbs) 2    Since your  last visit, are you checking your blood glucose at least once a day? Yes           Individualized Plan for Diabetes Self-Management Training:   Learning Objective:  Patient will have a greater understanding of diabetes self-management. Patient education plan is to attend individual and/or group sessions per assessed needs and concerns.   Plan:   Patient Instructions  Keep up the great changes, being more mindful of your food choices.  You are seeing positive results with your blood sugar because of this!  Consider alternatives for lunch.  Frozen meals should contain only about 600 mg sodium, less than 15 grams of fat, and less than 20 grams protein ideally.  Consider ways to be more active.  Walking 15 minutes most days of the week for instance.  Remember your insulin before lunch. Rethink the tea.  Great job reducing the sugar.  Consider asking your insurance about the Dexcom and FreeStyle Libre CGM.   Expected Outcomes:  Demonstrated interest in learning. Expect positive outcomes  Education material provided:   If problems or questions, patient to contact team via:  Phone  Future DSME appointment: 2 months

## 2019-12-31 NOTE — Progress Notes (Signed)
Please call her. She needs appt for health maintenace exam if she wants to remain in this practice.

## 2020-01-05 NOTE — Progress Notes (Signed)
Scheduled,.

## 2020-02-09 ENCOUNTER — Other Ambulatory Visit (HOSPITAL_COMMUNITY): Payer: Self-pay | Admitting: Internal Medicine

## 2020-02-09 ENCOUNTER — Ambulatory Visit: Payer: PRIVATE HEALTH INSURANCE | Attending: Internal Medicine

## 2020-02-09 DIAGNOSIS — Z23 Encounter for immunization: Secondary | ICD-10-CM

## 2020-02-09 NOTE — Progress Notes (Signed)
   Covid-19 Vaccination Clinic  Name:  Allison Patel    MRN: [id number] DOB: [birth year]  02/09/2020  Allison Patel was observed post Covid-19 immunization for 15 minutes without incident. She was provided with Vaccine Information Sheet and instruction to access the V-Safe system.   Allison Patel was instructed to call 911 with any severe reactions post vaccine: Marland Kitchen Difficulty breathing  . Swelling of face and throat  . A fast heartbeat  . A bad rash all over body  . Dizziness and weakness

## 2020-02-15 IMAGING — CR CHEST - 2 VIEW
2 series · 2 of 2 positions shown · non-contrast
Comparison: Radiograph October 10, 2018.

CLINICAL DATA: Shortness of breath.

EXAM:
CHEST - 2 VIEW

[w chest pa]
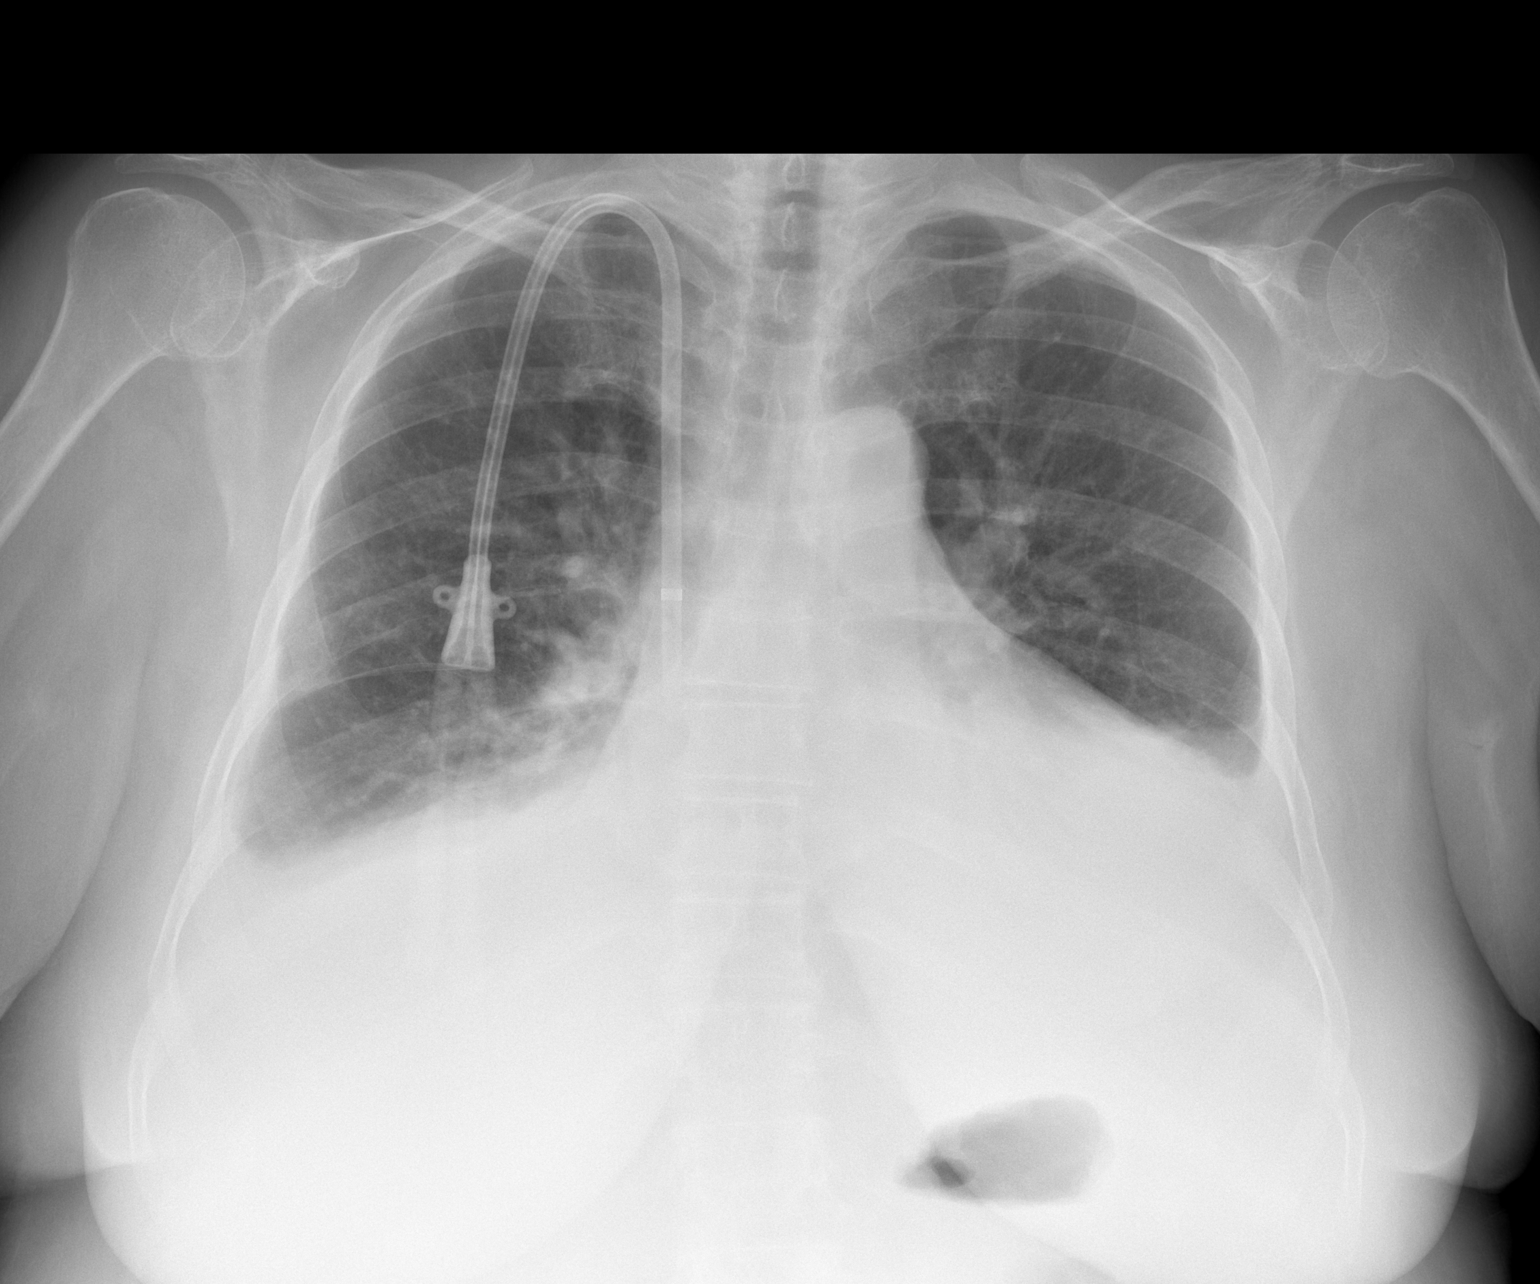

[w chest lat]
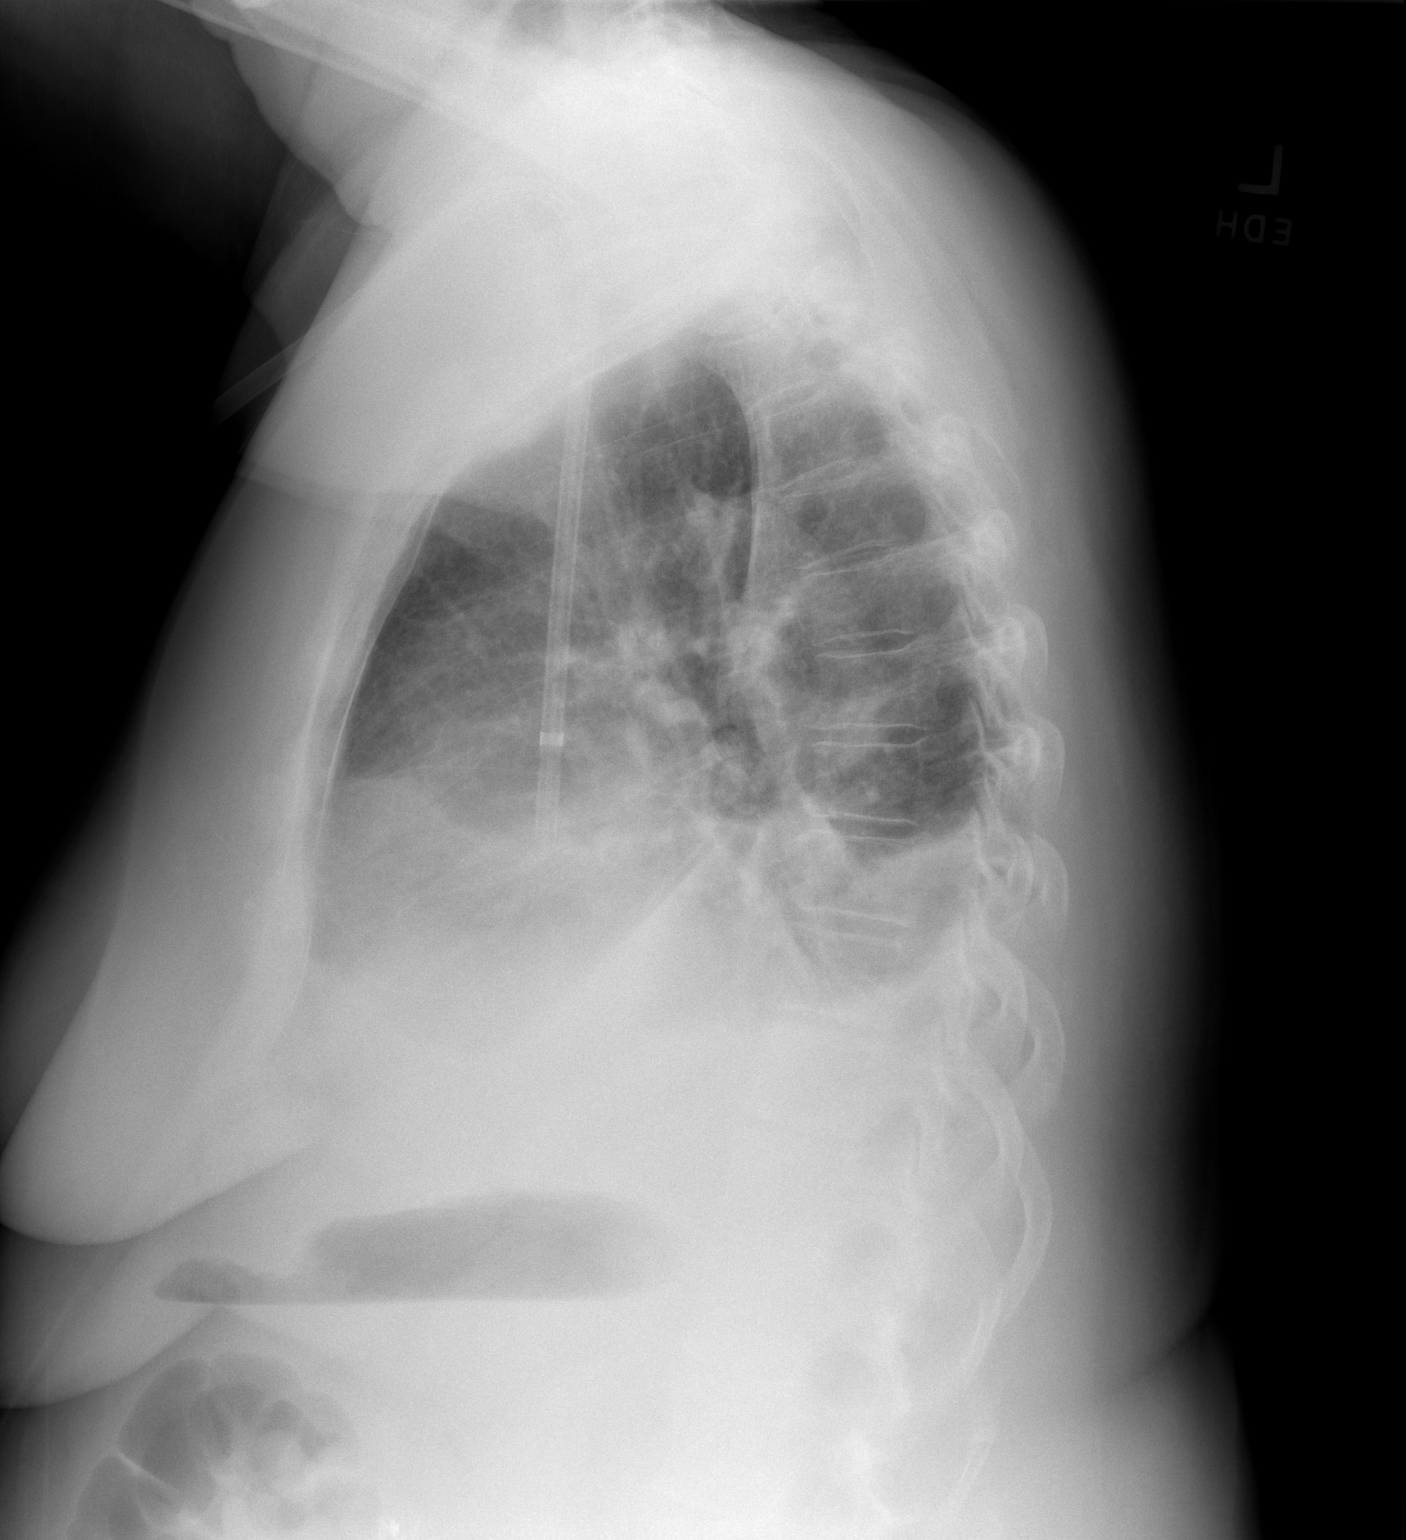

[2 of 2 positions shown; findings below may reference images not displayed]

FINDINGS: Stable cardiomediastinal silhouette. Mild bilateral pleural
effusions are noted with associated atelectasis or infiltrates, left
greater than right. No pneumothorax is noted. Right internal jugular
catheter is unchanged in position. Bony thorax is unremarkable.
IMPRESSION: Mild bilateral pleural effusions are noted with associated basilar
atelectasis or infiltrates, left greater than right.

## 2020-02-25 ENCOUNTER — Ambulatory Visit (INDEPENDENT_AMBULATORY_CARE_PROVIDER_SITE_OTHER): Payer: PRIVATE HEALTH INSURANCE | Admitting: Internal Medicine

## 2020-02-25 ENCOUNTER — Other Ambulatory Visit: Payer: Self-pay

## 2020-02-25 ENCOUNTER — Telehealth: Payer: Self-pay | Admitting: Internal Medicine

## 2020-02-25 VITALS — BP 160/90 | HR 110 | Temp 99.9°F

## 2020-02-25 DIAGNOSIS — R509 Fever, unspecified: Secondary | ICD-10-CM

## 2020-02-25 DIAGNOSIS — E119 Type 2 diabetes mellitus without complications: Secondary | ICD-10-CM | POA: Diagnosis not present

## 2020-02-25 DIAGNOSIS — J22 Unspecified acute lower respiratory infection: Secondary | ICD-10-CM

## 2020-02-25 DIAGNOSIS — Z794 Long term (current) use of insulin: Secondary | ICD-10-CM

## 2020-02-25 MED ORDER — DOXYCYCLINE HYCLATE 100 MG PO TABS: 100.0000 mg | ORAL_TABLET | Freq: Two times a day (BID) | ORAL | 0 refills | Status: DC

## 2020-02-25 NOTE — Telephone Encounter (Signed)
Car visit

## 2020-02-25 NOTE — Telephone Encounter (Signed)
Scheduled

## 2020-02-25 NOTE — Telephone Encounter (Signed)
Allison Patel [phone]  Allison called to say she has had a fever since Sunday, it has been staying around 100. She has been taking Tylenol it has been as high as 101. She started having some congestion this morning. They had been around her granddaughter that has been sick, they feel like that is where they pick this up at. She was tested on Monday at work for [employer] and it was negative.

## 2020-02-25 NOTE — Patient Instructions (Signed)
Doxycycline 100 mg twice a day for 10 days. Rest and drink fluids. Call if not improving in 48 hours or sooner if worse.

## 2020-02-25 NOTE — Progress Notes (Signed)
   Subjective:    Patient ID: Allison Patel, female    DOB: [birth year], 64 y.o.   MRN: [id number]  HPI 64 year old Female with history of insulin-dependent diabetes mellitus and chronic kidney disease seen with fever onset Sunday, October 31.  Has been taking Tylenol.  It has been as high as 101 degrees.  Having some respiratory congestion symptoms today.  She was around her granddaughter who was sick.  Patient says she was tested on Monday at work for [employer] and that it was negative.  COVID-19 and respiratory virus panels obtained today.  Both of these tests are negative  She has upcoming physical exam here November 23  Diabetes is managed by Dr. Buddy Duty, endocrinologist  Review of Systems denies today nausea vomiting or diarrhea.     Objective:   Physical Exam  Temperature 99.9 degrees, pulse 110 pulse oximetry 94% No acute distress.  Not tachypnea.  TMs are clear.  Neck is supple.  No adenopathy.  Chest is clear to auscultation without rales or wheezing.     Assessment & Plan:  Acute respiratory infection-subsequently reported that respiratory virus panel is negative  History of fever  COVID-19 test -subsequently reported as negative  Plan: Repeated COVID-19 test which proved to be negative.  Respiratory virus panel also was negative.  Doxycycline 100 mg twice daily for 10 days.  Rest and drink fluids.  Stay well-hydrated.  Do not return to work until afebrile.

## 2020-02-26 LAB — SARS-COV-2 RNA,(COVID-19) QUALITATIVE NAAT: SARS CoV2 RNA: NOT DETECTED

## 2020-02-28 LAB — RESPIRATORY VIRUS PANEL

## 2020-03-02 ENCOUNTER — Ambulatory Visit: Payer: PRIVATE HEALTH INSURANCE | Admitting: Dietician

## 2020-03-13 ENCOUNTER — Other Ambulatory Visit: Payer: PRIVATE HEALTH INSURANCE | Admitting: Internal Medicine

## 2020-03-13 ENCOUNTER — Other Ambulatory Visit: Payer: Self-pay

## 2020-03-13 DIAGNOSIS — E039 Hypothyroidism, unspecified: Secondary | ICD-10-CM

## 2020-03-13 DIAGNOSIS — I10 Essential (primary) hypertension: Secondary | ICD-10-CM

## 2020-03-13 DIAGNOSIS — E1165 Type 2 diabetes mellitus with hyperglycemia: Secondary | ICD-10-CM

## 2020-03-13 DIAGNOSIS — N1831 Chronic kidney disease, stage 3a: Secondary | ICD-10-CM

## 2020-03-13 DIAGNOSIS — Z6831 Body mass index (BMI) 31.0-31.9, adult: Secondary | ICD-10-CM

## 2020-03-13 DIAGNOSIS — E109 Type 1 diabetes mellitus without complications: Secondary | ICD-10-CM

## 2020-03-13 DIAGNOSIS — E119 Type 2 diabetes mellitus without complications: Secondary | ICD-10-CM

## 2020-03-13 DIAGNOSIS — N170 Acute kidney failure with tubular necrosis: Secondary | ICD-10-CM

## 2020-03-13 DIAGNOSIS — R7989 Other specified abnormal findings of blood chemistry: Secondary | ICD-10-CM

## 2020-03-13 DIAGNOSIS — E1169 Type 2 diabetes mellitus with other specified complication: Secondary | ICD-10-CM

## 2020-03-13 DIAGNOSIS — E7800 Pure hypercholesterolemia, unspecified: Secondary | ICD-10-CM

## 2020-03-14 ENCOUNTER — Encounter: Payer: Self-pay | Admitting: Internal Medicine

## 2020-03-14 ENCOUNTER — Ambulatory Visit (INDEPENDENT_AMBULATORY_CARE_PROVIDER_SITE_OTHER): Payer: PRIVATE HEALTH INSURANCE | Admitting: Internal Medicine

## 2020-03-14 ENCOUNTER — Other Ambulatory Visit: Payer: Self-pay

## 2020-03-14 VITALS — BP 180/90 | HR 78 | Temp 97.9°F | Ht 60.0 in | Wt 164.0 lb

## 2020-03-14 DIAGNOSIS — Z0000 Encounter for general adult medical examination without abnormal findings: Secondary | ICD-10-CM

## 2020-03-14 DIAGNOSIS — E119 Type 2 diabetes mellitus without complications: Secondary | ICD-10-CM

## 2020-03-14 DIAGNOSIS — N1831 Chronic kidney disease, stage 3a: Secondary | ICD-10-CM

## 2020-03-14 DIAGNOSIS — Z6832 Body mass index (BMI) 32.0-32.9, adult: Secondary | ICD-10-CM

## 2020-03-14 DIAGNOSIS — Z794 Long term (current) use of insulin: Secondary | ICD-10-CM

## 2020-03-14 DIAGNOSIS — R8290 Unspecified abnormal findings in urine: Secondary | ICD-10-CM

## 2020-03-14 DIAGNOSIS — E109 Type 1 diabetes mellitus without complications: Secondary | ICD-10-CM

## 2020-03-14 DIAGNOSIS — E1165 Type 2 diabetes mellitus with hyperglycemia: Secondary | ICD-10-CM

## 2020-03-14 DIAGNOSIS — R600 Localized edema: Secondary | ICD-10-CM

## 2020-03-14 DIAGNOSIS — E1169 Type 2 diabetes mellitus with other specified complication: Secondary | ICD-10-CM

## 2020-03-14 DIAGNOSIS — N170 Acute kidney failure with tubular necrosis: Secondary | ICD-10-CM

## 2020-03-14 DIAGNOSIS — I10 Essential (primary) hypertension: Secondary | ICD-10-CM | POA: Diagnosis not present

## 2020-03-14 DIAGNOSIS — E785 Hyperlipidemia, unspecified: Secondary | ICD-10-CM

## 2020-03-14 LAB — COMPLETE METABOLIC PANEL WITH GFR
AG Ratio: 1.1 (calc) (ref 1.0–2.5)
ALT: 10 U/L (ref 6–29)
AST: 11 U/L (ref 10–35)
Albumin: 3.5 g/dL — ABNORMAL LOW (ref 3.6–5.1)
Alkaline phosphatase (APISO): 121 U/L (ref 37–153)
BUN/Creatinine Ratio: 23 (calc) — ABNORMAL HIGH (ref 6–22)
BUN: 44 mg/dL — ABNORMAL HIGH (ref 7–25)
CO2: 27 mmol/L (ref 20–32)
Calcium: 9.4 mg/dL (ref 8.6–10.4)
Chloride: 100 mmol/L (ref 98–110)
Creat: 1.92 mg/dL — ABNORMAL HIGH (ref 0.50–0.99)
GFR, Est African American: 31 mL/min/{1.73_m2} — ABNORMAL LOW (ref >=60)
GFR, Est Non African American: 27 mL/min/{1.73_m2} — ABNORMAL LOW (ref >=60)
Globulin: 3.1 g/dL (calc) (ref 1.9–3.7)
Glucose, Bld: 197 mg/dL — ABNORMAL HIGH (ref 65–99)
Potassium: 4.9 mmol/L (ref 3.5–5.3)
Sodium: 137 mmol/L (ref 135–146)
Total Bilirubin: 0.8 mg/dL (ref 0.2–1.2)
Total Protein: 6.6 g/dL (ref 6.1–8.1)

## 2020-03-14 LAB — LIPID PANEL
Cholesterol: 272 mg/dL — ABNORMAL HIGH (ref <200)
HDL: 57 mg/dL (ref >=50)
LDL Cholesterol (Calc): 179 mg/dL (calc) — ABNORMAL HIGH
Non-HDL Cholesterol (Calc): 215 mg/dL (calc) — ABNORMAL HIGH (ref <130)
Total CHOL/HDL Ratio: 4.8 (calc) (ref <5.0)
Triglycerides: 200 mg/dL — ABNORMAL HIGH (ref <150)

## 2020-03-14 LAB — POCT URINALYSIS DIPSTICK
Bilirubin, UA: NEGATIVE
Glucose, UA: POSITIVE — AB
Ketones, UA: NEGATIVE
Nitrite, UA: NEGATIVE
Protein, UA: POSITIVE — AB
Spec Grav, UA: 1.015 (ref 1.010–1.025)
Urobilinogen, UA: 0.2 E.U./dL
pH, UA: 6 (ref 5.0–8.0)

## 2020-03-14 LAB — CBC WITH DIFFERENTIAL/PLATELET
Absolute Monocytes: 520 cells/uL (ref 200–950)
Basophils Absolute: 98 cells/uL (ref 0–200)
Basophils Relative: 1.5 %
Eosinophils Absolute: 72 cells/uL (ref 15–500)
Eosinophils Relative: 1.1 %
HCT: 37.2 % (ref 35.0–45.0)
Hemoglobin: 12 g/dL (ref 11.7–15.5)
Lymphs Abs: 1118 cells/uL (ref 850–3900)
MCH: 27.6 pg (ref 27.0–33.0)
MCHC: 32.3 g/dL (ref 32.0–36.0)
MCV: 85.7 fL (ref 80.0–100.0)
MPV: 11.3 fL (ref 7.5–12.5)
Monocytes Relative: 8 %
Neutro Abs: 4693 cells/uL (ref 1500–7800)
Neutrophils Relative %: 72.2 %
Platelets: 314 10*3/uL (ref 140–400)
RBC: 4.34 10*6/uL (ref 3.80–5.10)
RDW: 14.4 % (ref 11.0–15.0)
Total Lymphocyte: 17.2 %
WBC: 6.5 10*3/uL (ref 3.8–10.8)

## 2020-03-14 LAB — HEMOGLOBIN A1C
Hgb A1c MFr Bld: 9.9 % of total Hgb — ABNORMAL HIGH (ref <5.7)
Mean Plasma Glucose: 237 (calc)
eAG (mmol/L): 13.2 (calc)

## 2020-03-14 LAB — MICROALBUMIN / CREATININE URINE RATIO
Creatinine, Urine: 39 mg/dL (ref 20–275)
Microalb Creat Ratio: 2151 mcg/mg creat — ABNORMAL HIGH (ref <30)
Microalb, Ur: 83.9 mg/dL

## 2020-03-14 LAB — TSH: TSH: 2.87 mIU/L (ref 0.40–4.50)

## 2020-03-14 LAB — VITAMIN D 25 HYDROXY (VIT D DEFICIENCY, FRACTURES): Vit D, 25-Hydroxy: 11 ng/mL — ABNORMAL LOW (ref 30–100)

## 2020-03-14 MED ORDER — CLONIDINE HCL 0.2 MG PO TABS
0.2000 mg | ORAL_TABLET | Freq: Two times a day (BID) | ORAL | 3 refills | Status: DC
Start: 2020-03-14 — End: 2021-04-09

## 2020-03-14 MED ORDER — METOPROLOL TARTRATE 25 MG PO TABS
12.5000 mg | ORAL_TABLET | Freq: Two times a day (BID) | ORAL | 5 refills | Status: DC
Start: 2020-03-14 — End: 2020-09-17

## 2020-03-14 MED ORDER — ROSUVASTATIN CALCIUM 40 MG PO TABS
40.0000 mg | ORAL_TABLET | Freq: Every day | ORAL | 3 refills | Status: DC
Start: 2020-03-14 — End: 2021-05-25

## 2020-03-14 MED ORDER — AMLODIPINE BESYLATE 10 MG PO TABS: 10.0000 mg | ORAL_TABLET | Freq: Every day | ORAL | 3 refills | Status: DC

## 2020-03-14 MED ORDER — LEVOTHYROXINE SODIUM 50 MCG PO TABS
ORAL_TABLET | ORAL | 3 refills | Status: DC
Start: 2020-03-14 — End: 2021-06-21

## 2020-03-14 MED ORDER — DOXYCYCLINE HYCLATE 100 MG PO TABS: 100.0000 mg | ORAL_TABLET | Freq: Two times a day (BID) | ORAL | 0 refills | Status: DC

## 2020-03-14 NOTE — Progress Notes (Signed)
Subjective:    Patient ID: Allison Patel, female    DOB: 22-Jul-1955, 64 y.o.   MRN: [id number]  HPI  64 year old Female for health maintenance exam and evaluation of medical issues.    Out of Crestor x 2 weeks. Refilled today.   Colonoscopy discussed.  Was here in early November for respiratory infection.  COVID-19 and respiratory virus panels were negative.  She was treated with doxycycline for 10 days.  Dr. Buddy Duty is her Endocrinologist.  He manages her diabetes mellitus and I manage her hypothyroidism.  She was admitted with septicemia July 1 through December 03, 2018 and had to undergo hemodialysis.  Blood culture tested positive for Enterobacter cloacae and responded well to antibiotics but kidney function deteriorated.  She had confusion, hyponatremia, acidosis hyperglycemia acute renal failure and septicemia.  She had hyperphosphatemia which was treated with Lorin Picket but that has been discontinued in October 2020.  Her hemoglobin A1c is 9.9%.  In October 2020 it was 11.2% and in June 2020 it was 14% and in 2019 it was greater than 14%.  History of hypothyroidism treated with low-dose Synthroid.  History of Cesarean section x2, cholecystectomy, bilateral salpingectomy and abdominal hysterectomy.  Continues to be followed at [facility].  Longstanding history of obesity and obesity runs in her family.  History of hypertension followed by [facility].  EKG has shown bifascicular block in the past.  Reminded about diabetic eye exam.  Social history: She is a Technical sales engineer at [employer].  Husband is an Occupational hygienist.  She completed 2 years of college.  Does not smoke or consume alcohol.  She resides in [place] and has 2 adult daughters.  Family history: Mother died at age 47 with congestive heart failure.  Father died at age 75 with complications of pneumonia and heart failure.  Another brother in good  health.  No sisters.  Old records indicate she has a history of osteopenia with stress fracture in her pelvis.  History of hypertrophic cardiomyopathy previously seen by Dr. Pernell Dupre.  Review of Systems protracted cough     Objective:   Physical Exam Blood pressure today is 180/90 which is concerning pulse is 78 and regular temperature 97.9 pulse oximetry 98% weight 164 pounds BMI 32.03.  Skin warm and dry.  Nodes none.  Neck is supple without JVD thyromegaly or carotid bruits.  Chest is clear to auscultation.  Breast without masses.  Cardiac exam regular rate and rhythm normal S1 and S2 abdomen soft nondistended without hepatosplenomegaly masses or tenderness.  No lower extremity pitting edema.  Affect felt and judgment are normal.       Assessment & Plan:  Mixed hyperlipidemia total cholesterol is 272 and triglycerides were 200.  HDL has improved slightly to 57 and was 15 a year ago and 52 years ago.  LDL is 179.  Type 2 diabetes mellitus insulin-dependent hemoglobin A1c 9.9%.  Encouraged her to tighten up her glucose control with diet and exercise.  History of hyperphosphatemia due to chronic kidney disease and now taking calcium acetate  Chronic kidney disease followed at [facility]  Essential hypertension stable on current regimen but she has office hypertension.  Says blood pressure is better at home but I am concerned about this high reading today.  It was repeated several times and remained high.  Will let nephrologist address this.  Lower extremity edema related to kidney disease and treated with high-dose Lasix  Morbid  obesity-not motivated to diet and exercise  Hypothyroidism stable on thyroid replacement medication  History of septicemia resulting in acute renal failure and subsequently chronic kidney disease  Abnormal urine dipstick but urine culture is negative  Plan: She will continue to be followed at [facility] and by Dr. Buddy Duty,  Endocrinologist.  I am concerned she is not taking care of herself.  Return in 6 months.

## 2020-03-15 LAB — URINALYSIS, MICROSCOPIC ONLY
Bacteria, UA: NONE SEEN /HPF
Hyaline Cast: NONE SEEN /LPF

## 2020-03-15 LAB — URINE CULTURE
MICRO NUMBER:: 11238882
Result:: NO GROWTH
SPECIMEN QUALITY:: ADEQUATE

## 2020-03-19 ENCOUNTER — Encounter: Payer: Self-pay | Admitting: Internal Medicine

## 2020-03-20 ENCOUNTER — Telehealth: Payer: Self-pay | Admitting: Internal Medicine

## 2020-03-20 MED ORDER — ERGOCALCIFEROL 1.25 MG (50000 UT) PO CAPS: 50000.0000 [IU] | ORAL_CAPSULE | ORAL | 1 refills | Status: DC

## 2020-03-20 NOTE — Telephone Encounter (Signed)
High dose Vitamin D 50,000 units weekly x 6 months recommended. Rx sent to [facility] today. MJB

## 2020-03-30 ENCOUNTER — Ambulatory Visit: Payer: PRIVATE HEALTH INSURANCE | Admitting: Dietician

## 2020-04-21 ENCOUNTER — Encounter: Payer: Self-pay | Admitting: Internal Medicine

## 2020-04-21 NOTE — Patient Instructions (Signed)
Please continue to see Dr. Buddy Duty and nephrologist for management of diabetes and kidney issues.  Blood pressure is elevated today but I think may be due to office hypertension.  Please keep an eye on it.  Return in 1 year or as needed.  Please try not to run out of your medication.  Please try to take good care of yourself and stay safe during the Covid pandemic.

## 2020-09-14 ENCOUNTER — Ambulatory Visit: Payer: PRIVATE HEALTH INSURANCE | Admitting: Internal Medicine

## 2020-09-17 ENCOUNTER — Other Ambulatory Visit: Payer: Self-pay | Admitting: Internal Medicine

## 2020-10-16 ENCOUNTER — Ambulatory Visit (INDEPENDENT_AMBULATORY_CARE_PROVIDER_SITE_OTHER): Payer: No Typology Code available for payment source | Admitting: Internal Medicine

## 2020-10-16 ENCOUNTER — Other Ambulatory Visit: Payer: Self-pay

## 2020-10-16 ENCOUNTER — Encounter: Payer: Self-pay | Admitting: Internal Medicine

## 2020-10-16 VITALS — BP 160/100 | HR 87 | Ht 60.0 in | Wt 174.0 lb

## 2020-10-16 DIAGNOSIS — I10 Essential (primary) hypertension: Secondary | ICD-10-CM | POA: Diagnosis not present

## 2020-10-16 DIAGNOSIS — Z794 Long term (current) use of insulin: Secondary | ICD-10-CM

## 2020-10-16 DIAGNOSIS — N184 Chronic kidney disease, stage 4 (severe): Secondary | ICD-10-CM

## 2020-10-16 DIAGNOSIS — E119 Type 2 diabetes mellitus without complications: Secondary | ICD-10-CM

## 2020-10-16 DIAGNOSIS — E1165 Type 2 diabetes mellitus with hyperglycemia: Secondary | ICD-10-CM | POA: Diagnosis not present

## 2020-10-16 DIAGNOSIS — I421 Obstructive hypertrophic cardiomyopathy: Secondary | ICD-10-CM

## 2020-10-16 DIAGNOSIS — I129 Hypertensive chronic kidney disease with stage 1 through stage 4 chronic kidney disease, or unspecified chronic kidney disease: Secondary | ICD-10-CM

## 2020-10-16 NOTE — Progress Notes (Signed)
   Subjective:    Patient ID: Allison Patel, female    DOB: 24-Mar-1956, 65 y.o.   MRN: [id number]  HPI 65 year old Female seen for 6 month follow up.  Needs to have colonoscopy.  She has history of diabetes mellitus followed by Dr. Robina Ade and hypothyroidism.  She is morbidly obese.  She is followed at [facility] for chronic kidney disease which began after being admitted in December 2020 with Enterobacter cloacae sepsis.  She was quite ill and had to undergo hemodialysis.  She had confusion, hyponatremia, acidosis and hyperglycemia with acute renal failure and septicemia.  She had hyperphosphatemia.  History of hypothyroidism treated with low-dose Synthroid.  History of 2 cesarean sections, cholecystectomy, bilateral salpingectomy and abdominal hysterectomy.  Longstanding history of obesity and obesity runs in her family.  Hypertension followed by Nephrology.  EKG showed bifascicular block in the past.  Reminded about diabetic eye exam.  Was just seen by [facility] and labs reviewed.  They have been filed under media in this chart creatinine is 2.18 and GFR is 25 cc/min calcium and phosphorus are within normal limits sodium is 137 and potassium 4.3 PTH is elevated at 123.  Currently followed for diabetes mellitus by Dr. Buddy Duty endocrinologist and recent A1c was 10.2% in April.  I wish she were more motivated to take better better care of herself.  This has been a longstanding issue.    Review of Systems no new complaints     Objective:   Physical Exam BP 160/100, pulse 87 pulse ox 97% weight 174 pounds BMI 33.98 Pleasant female seen in no acute distress.  Neck is supple without JVD thyromegaly or carotid bruits.  Chest is clear to auscultation.  Cardiac exam regular rate and rhythm without ectopy.  No lower extremity pitting edema.  Affect thought and judgment appear to be normal.      Assessment & Plan:  Chronic kidney disease stage III- IV-followed by  Nephrology  Type 2 diabetes mellitus-insulin-dependent and followed by Dr. Buddy Duty. HGB AIC was 10 in April with Dr. Buddy Duty  Essential hypertension-blood pressure elevated today but patient says it runs better than this usually  Morbid obesity-continue to encourage diet exercise and weight loss  History of vitamin D deficiency treated with vitamin D supplementation  Intact PTH elevated at 123 followed  by Nephrology.  Health maintenance-mammogram ordered.  Last 1 on file 2018.  She needs colonoscopy but if she prefers not to do that we could consider Cologuard.  Plan: Patient will return in 6 months for health maintenance exam.  Once again tried to encourage her to work on diet ,exercise ,and weight loss.  She works a full-time job and has a family.  Encouraged her to take better care of herself.

## 2020-10-26 ENCOUNTER — Other Ambulatory Visit: Payer: No Typology Code available for payment source

## 2020-10-26 ENCOUNTER — Other Ambulatory Visit: Payer: Self-pay | Admitting: Physician Assistant

## 2020-10-26 DIAGNOSIS — M25562 Pain in left knee: Secondary | ICD-10-CM

## 2020-10-27 ENCOUNTER — Ambulatory Visit
Admission: RE | Admit: 2020-10-27 | Discharge: 2020-10-27 | Disposition: A | Discharge status: Home / Self Care | Payer: No Typology Code available for payment source | Source: Ambulatory Visit | Attending: Physician Assistant | Admitting: Physician Assistant

## 2020-10-27 DIAGNOSIS — M25562 Pain in left knee: Secondary | ICD-10-CM

## 2020-11-19 NOTE — Patient Instructions (Addendum)
Continue to encourage diet exercise and weight loss.  Continue current medications and follow-up with Nephrologist and Endocrinologist.  Reminded about need for colonoscopy.  Can do Cologuard if patient does not want to do colonoscopy.  Needs annual mammogram.  This was ordered.  Last 1 on file 2018.

## 2021-03-20 ENCOUNTER — Other Ambulatory Visit: Payer: No Typology Code available for payment source | Admitting: Internal Medicine

## 2021-03-23 ENCOUNTER — Encounter: Payer: No Typology Code available for payment source | Admitting: Internal Medicine

## 2021-03-24 ENCOUNTER — Other Ambulatory Visit: Payer: Self-pay | Admitting: Internal Medicine

## 2021-04-08 ENCOUNTER — Other Ambulatory Visit: Payer: Self-pay | Admitting: Internal Medicine

## 2021-04-25 ENCOUNTER — Other Ambulatory Visit: Payer: Self-pay

## 2021-04-25 ENCOUNTER — Encounter (INDEPENDENT_AMBULATORY_CARE_PROVIDER_SITE_OTHER): Payer: No Typology Code available for payment source | Admitting: Ophthalmology

## 2021-04-25 DIAGNOSIS — E113513 Type 2 diabetes mellitus with proliferative diabetic retinopathy with macular edema, bilateral: Secondary | ICD-10-CM

## 2021-04-25 DIAGNOSIS — I10 Essential (primary) hypertension: Secondary | ICD-10-CM

## 2021-04-25 DIAGNOSIS — H35033 Hypertensive retinopathy, bilateral: Secondary | ICD-10-CM

## 2021-04-25 DIAGNOSIS — H43813 Vitreous degeneration, bilateral: Secondary | ICD-10-CM

## 2021-04-25 DIAGNOSIS — H4311 Vitreous hemorrhage, right eye: Secondary | ICD-10-CM | POA: Diagnosis not present

## 2021-05-16 ENCOUNTER — Encounter (INDEPENDENT_AMBULATORY_CARE_PROVIDER_SITE_OTHER): Payer: No Typology Code available for payment source | Admitting: Ophthalmology

## 2021-05-16 ENCOUNTER — Other Ambulatory Visit: Payer: Self-pay

## 2021-05-16 DIAGNOSIS — E113513 Type 2 diabetes mellitus with proliferative diabetic retinopathy with macular edema, bilateral: Secondary | ICD-10-CM | POA: Diagnosis not present

## 2021-05-25 ENCOUNTER — Other Ambulatory Visit: Payer: Self-pay | Admitting: Internal Medicine

## 2021-06-13 ENCOUNTER — Other Ambulatory Visit: Payer: Self-pay

## 2021-06-13 ENCOUNTER — Encounter (INDEPENDENT_AMBULATORY_CARE_PROVIDER_SITE_OTHER): Payer: Medicare HMO | Admitting: Ophthalmology

## 2021-06-13 DIAGNOSIS — H35033 Hypertensive retinopathy, bilateral: Secondary | ICD-10-CM | POA: Diagnosis not present

## 2021-06-13 DIAGNOSIS — I10 Essential (primary) hypertension: Secondary | ICD-10-CM

## 2021-06-13 DIAGNOSIS — H4313 Vitreous hemorrhage, bilateral: Secondary | ICD-10-CM | POA: Diagnosis not present

## 2021-06-13 DIAGNOSIS — H43813 Vitreous degeneration, bilateral: Secondary | ICD-10-CM

## 2021-06-13 DIAGNOSIS — E113513 Type 2 diabetes mellitus with proliferative diabetic retinopathy with macular edema, bilateral: Secondary | ICD-10-CM | POA: Diagnosis not present

## 2021-06-18 ENCOUNTER — Other Ambulatory Visit: Payer: No Typology Code available for payment source | Admitting: Internal Medicine

## 2021-06-18 ENCOUNTER — Other Ambulatory Visit: Payer: Self-pay

## 2021-06-18 DIAGNOSIS — E039 Hypothyroidism, unspecified: Secondary | ICD-10-CM

## 2021-06-18 DIAGNOSIS — E7800 Pure hypercholesterolemia, unspecified: Secondary | ICD-10-CM | POA: Diagnosis not present

## 2021-06-18 DIAGNOSIS — I10 Essential (primary) hypertension: Secondary | ICD-10-CM

## 2021-06-19 LAB — COMPLETE METABOLIC PANEL WITH GFR
AG Ratio: 1.2 (calc) (ref 1.0–2.5)
ALT: 10 U/L (ref 6–29)
AST: 13 U/L (ref 10–35)
Albumin: 3.8 g/dL (ref 3.6–5.1)
Alkaline phosphatase (APISO): 101 U/L (ref 37–153)
BUN/Creatinine Ratio: 16 (calc) (ref 6–22)
BUN: 50 mg/dL — ABNORMAL HIGH (ref 7–25)
CO2: 25 mmol/L (ref 20–32)
Calcium: 9.1 mg/dL (ref 8.6–10.4)
Chloride: 100 mmol/L (ref 98–110)
Creat: 3.06 mg/dL — ABNORMAL HIGH (ref 0.50–1.05)
Globulin: 3.3 g/dL (calc) (ref 1.9–3.7)
Glucose, Bld: 207 mg/dL — ABNORMAL HIGH (ref 65–99)
Potassium: 4.6 mmol/L (ref 3.5–5.3)
Sodium: 137 mmol/L (ref 135–146)
Total Bilirubin: 0.5 mg/dL (ref 0.2–1.2)
Total Protein: 7.1 g/dL (ref 6.1–8.1)
eGFR: 16 mL/min/{1.73_m2} — ABNORMAL LOW (ref >=60)

## 2021-06-19 LAB — CBC WITH DIFFERENTIAL/PLATELET
Absolute Monocytes: 567 cells/uL (ref 200–950)
Basophils Absolute: 81 cells/uL (ref 0–200)
Basophils Relative: 0.9 %
Eosinophils Absolute: 162 cells/uL (ref 15–500)
Eosinophils Relative: 1.8 %
HCT: 36.3 % (ref 35.0–45.0)
Hemoglobin: 11.8 g/dL (ref 11.7–15.5)
Lymphs Abs: 1080 cells/uL (ref 850–3900)
MCH: 27.4 pg (ref 27.0–33.0)
MCHC: 32.5 g/dL (ref 32.0–36.0)
MCV: 84.4 fL (ref 80.0–100.0)
MPV: 12.6 fL — ABNORMAL HIGH (ref 7.5–12.5)
Monocytes Relative: 6.3 %
Neutro Abs: 7110 cells/uL (ref 1500–7800)
Neutrophils Relative %: 79 %
Platelets: 269 10*3/uL (ref 140–400)
RBC: 4.3 10*6/uL (ref 3.80–5.10)
RDW: 14.4 % (ref 11.0–15.0)
Total Lymphocyte: 12 %
WBC: 9 10*3/uL (ref 3.8–10.8)

## 2021-06-19 LAB — LIPID PANEL
Cholesterol: 195 mg/dL (ref <200)
HDL: 63 mg/dL (ref >=50)
LDL Cholesterol (Calc): 100 mg/dL (calc) — ABNORMAL HIGH
Non-HDL Cholesterol (Calc): 132 mg/dL (calc) — ABNORMAL HIGH (ref <130)
Total CHOL/HDL Ratio: 3.1 (calc) (ref <5.0)
Triglycerides: 204 mg/dL — ABNORMAL HIGH (ref <150)

## 2021-06-19 LAB — TSH: TSH: 6.28 mIU/L — ABNORMAL HIGH (ref 0.40–4.50)

## 2021-06-21 ENCOUNTER — Other Ambulatory Visit: Payer: Self-pay

## 2021-06-21 ENCOUNTER — Encounter: Payer: Self-pay | Admitting: Internal Medicine

## 2021-06-21 ENCOUNTER — Ambulatory Visit (INDEPENDENT_AMBULATORY_CARE_PROVIDER_SITE_OTHER): Payer: No Typology Code available for payment source | Admitting: Internal Medicine

## 2021-06-21 VITALS — BP 128/80 | HR 92 | Ht 60.0 in | Wt 171.0 lb

## 2021-06-21 DIAGNOSIS — I129 Hypertensive chronic kidney disease with stage 1 through stage 4 chronic kidney disease, or unspecified chronic kidney disease: Secondary | ICD-10-CM

## 2021-06-21 DIAGNOSIS — I10 Essential (primary) hypertension: Secondary | ICD-10-CM | POA: Diagnosis not present

## 2021-06-21 DIAGNOSIS — Z23 Encounter for immunization: Secondary | ICD-10-CM

## 2021-06-21 DIAGNOSIS — R7989 Other specified abnormal findings of blood chemistry: Secondary | ICD-10-CM | POA: Diagnosis not present

## 2021-06-21 DIAGNOSIS — E039 Hypothyroidism, unspecified: Secondary | ICD-10-CM

## 2021-06-21 DIAGNOSIS — E1165 Type 2 diabetes mellitus with hyperglycemia: Secondary | ICD-10-CM | POA: Diagnosis not present

## 2021-06-21 DIAGNOSIS — Z0000 Encounter for general adult medical examination without abnormal findings: Secondary | ICD-10-CM

## 2021-06-21 DIAGNOSIS — Z794 Long term (current) use of insulin: Secondary | ICD-10-CM

## 2021-06-21 DIAGNOSIS — E119 Type 2 diabetes mellitus without complications: Secondary | ICD-10-CM

## 2021-06-21 DIAGNOSIS — Z6833 Body mass index (BMI) 33.0-33.9, adult: Secondary | ICD-10-CM

## 2021-06-21 DIAGNOSIS — N1831 Chronic kidney disease, stage 3a: Secondary | ICD-10-CM

## 2021-06-21 DIAGNOSIS — M8580 Other specified disorders of bone density and structure, unspecified site: Secondary | ICD-10-CM | POA: Diagnosis not present

## 2021-06-21 DIAGNOSIS — S82142A Displaced bicondylar fracture of left tibia, initial encounter for closed fracture: Secondary | ICD-10-CM

## 2021-06-21 DIAGNOSIS — N184 Chronic kidney disease, stage 4 (severe): Secondary | ICD-10-CM

## 2021-06-21 MED ORDER — LEVOTHYROXINE SODIUM 75 MCG PO TABS: 75.0000 ug | ORAL_TABLET | Freq: Every day | ORAL | 3 refills | Status: DC

## 2021-06-21 NOTE — Progress Notes (Unsigned)
° °  Subjective:    Patient ID: Allison Patel, female    DOB: [birth year], 66 y.o.   MRN: [id number]  HPI 66 year old Female seen for health maintenance exam and evaluation of medical issues. Sees Dr. Buddy Duty for management of DM and Dr. Moshe Cipro for CKD Stage 4. Crearinine has incresaed since last with with Dr. Moshe Cipro. Faxing labs to her and Dr. Buddy Duty. Fasting glucose 207 with this appt. Has Dexcom device. At 6 am glucose was 200 and 111 just now at 11:29 am. Has sliding scale Humalog.  Fracture of left tibial plateau Summer 2022.  TSH is elevated.  Is supposed to be taking levothyroxine 50 mcg daily but last prescription was written in 2021 and expired November 2022.  Increase levothyroxine to 0.075 mg daily and follow-up in 6 weeks.  Review of Systems Sees Dr. Moshe Cipro q 6 months for CKD     Objective:   Physical Exam Blood pressure 128/80 pulse 92 pulse oximetry 99% weight 171 pounds BMI 33.40       Assessment & Plan:

## 2021-06-21 NOTE — Patient Instructions (Addendum)
Pneumococcal 20 given Shingrix discussed. Bone density ordered. Mammogram ordered. Referral to [facility] GI colonoscopy.  Continue follow-up with Dr. Buddy Duty for diabetes mellitus management and with [facility] for chronic kidney disease.  Return here in 1 year or as needed.  Please continue to work on diet exercise and weight loss. ?

## 2021-06-22 ENCOUNTER — Telehealth: Payer: Self-pay | Admitting: Internal Medicine

## 2021-06-22 NOTE — Telephone Encounter (Addendum)
LVM to CB and schedule lab for TSH and 6 wk OV. ?Dr Renold Genta called in new dosage of Thyroid medication ?

## 2021-06-26 DIAGNOSIS — N184 Chronic kidney disease, stage 4 (severe): Secondary | ICD-10-CM | POA: Diagnosis not present

## 2021-06-26 DIAGNOSIS — E1142 Type 2 diabetes mellitus with diabetic polyneuropathy: Secondary | ICD-10-CM | POA: Diagnosis not present

## 2021-06-26 DIAGNOSIS — Z794 Long term (current) use of insulin: Secondary | ICD-10-CM | POA: Diagnosis not present

## 2021-06-26 DIAGNOSIS — E1165 Type 2 diabetes mellitus with hyperglycemia: Secondary | ICD-10-CM | POA: Diagnosis not present

## 2021-06-26 LAB — HEMOGLOBIN A1C: Hemoglobin A1C: 11.8

## 2021-06-27 NOTE — Telephone Encounter (Signed)
Scheduled appointments

## 2021-06-28 ENCOUNTER — Encounter: Payer: Self-pay | Admitting: Internal Medicine

## 2021-07-11 ENCOUNTER — Other Ambulatory Visit: Payer: Self-pay

## 2021-07-11 ENCOUNTER — Encounter (INDEPENDENT_AMBULATORY_CARE_PROVIDER_SITE_OTHER): Payer: No Typology Code available for payment source | Admitting: Ophthalmology

## 2021-07-11 DIAGNOSIS — H43813 Vitreous degeneration, bilateral: Secondary | ICD-10-CM

## 2021-07-11 DIAGNOSIS — I10 Essential (primary) hypertension: Secondary | ICD-10-CM | POA: Diagnosis not present

## 2021-07-11 DIAGNOSIS — E113513 Type 2 diabetes mellitus with proliferative diabetic retinopathy with macular edema, bilateral: Secondary | ICD-10-CM | POA: Diagnosis not present

## 2021-07-11 DIAGNOSIS — H4312 Vitreous hemorrhage, left eye: Secondary | ICD-10-CM

## 2021-07-11 DIAGNOSIS — H35033 Hypertensive retinopathy, bilateral: Secondary | ICD-10-CM

## 2021-07-19 ENCOUNTER — Encounter (INDEPENDENT_AMBULATORY_CARE_PROVIDER_SITE_OTHER): Payer: No Typology Code available for payment source | Admitting: Ophthalmology

## 2021-07-19 DIAGNOSIS — E113512 Type 2 diabetes mellitus with proliferative diabetic retinopathy with macular edema, left eye: Secondary | ICD-10-CM

## 2021-07-29 ENCOUNTER — Other Ambulatory Visit: Payer: Self-pay | Admitting: Internal Medicine

## 2021-08-08 ENCOUNTER — Encounter (INDEPENDENT_AMBULATORY_CARE_PROVIDER_SITE_OTHER): Payer: No Typology Code available for payment source | Admitting: Ophthalmology

## 2021-08-08 DIAGNOSIS — E113513 Type 2 diabetes mellitus with proliferative diabetic retinopathy with macular edema, bilateral: Secondary | ICD-10-CM

## 2021-08-08 DIAGNOSIS — H4311 Vitreous hemorrhage, right eye: Secondary | ICD-10-CM

## 2021-08-08 DIAGNOSIS — I10 Essential (primary) hypertension: Secondary | ICD-10-CM

## 2021-08-08 DIAGNOSIS — H35033 Hypertensive retinopathy, bilateral: Secondary | ICD-10-CM

## 2021-08-08 DIAGNOSIS — H43813 Vitreous degeneration, bilateral: Secondary | ICD-10-CM

## 2021-08-09 ENCOUNTER — Other Ambulatory Visit: Payer: No Typology Code available for payment source

## 2021-08-09 DIAGNOSIS — E039 Hypothyroidism, unspecified: Secondary | ICD-10-CM

## 2021-08-13 ENCOUNTER — Ambulatory Visit: Payer: No Typology Code available for payment source | Admitting: Internal Medicine

## 2021-08-20 ENCOUNTER — Encounter (INDEPENDENT_AMBULATORY_CARE_PROVIDER_SITE_OTHER): Payer: No Typology Code available for payment source | Admitting: Ophthalmology

## 2021-08-20 DIAGNOSIS — E113511 Type 2 diabetes mellitus with proliferative diabetic retinopathy with macular edema, right eye: Secondary | ICD-10-CM

## 2021-09-05 ENCOUNTER — Encounter (INDEPENDENT_AMBULATORY_CARE_PROVIDER_SITE_OTHER): Payer: No Typology Code available for payment source | Admitting: Ophthalmology

## 2021-09-05 DIAGNOSIS — H2513 Age-related nuclear cataract, bilateral: Secondary | ICD-10-CM

## 2021-09-05 DIAGNOSIS — H43813 Vitreous degeneration, bilateral: Secondary | ICD-10-CM | POA: Diagnosis not present

## 2021-09-05 DIAGNOSIS — I10 Essential (primary) hypertension: Secondary | ICD-10-CM | POA: Diagnosis not present

## 2021-09-05 DIAGNOSIS — E113513 Type 2 diabetes mellitus with proliferative diabetic retinopathy with macular edema, bilateral: Secondary | ICD-10-CM

## 2021-09-05 DIAGNOSIS — H35033 Hypertensive retinopathy, bilateral: Secondary | ICD-10-CM

## 2021-10-03 ENCOUNTER — Encounter (INDEPENDENT_AMBULATORY_CARE_PROVIDER_SITE_OTHER): Payer: No Typology Code available for payment source | Admitting: Ophthalmology

## 2021-10-03 DIAGNOSIS — E113513 Type 2 diabetes mellitus with proliferative diabetic retinopathy with macular edema, bilateral: Secondary | ICD-10-CM | POA: Diagnosis not present

## 2021-10-03 DIAGNOSIS — H43813 Vitreous degeneration, bilateral: Secondary | ICD-10-CM

## 2021-10-03 DIAGNOSIS — I10 Essential (primary) hypertension: Secondary | ICD-10-CM

## 2021-10-03 DIAGNOSIS — H35033 Hypertensive retinopathy, bilateral: Secondary | ICD-10-CM | POA: Diagnosis not present

## 2021-10-12 ENCOUNTER — Other Ambulatory Visit: Payer: Self-pay | Admitting: Internal Medicine

## 2021-10-29 DIAGNOSIS — Z794 Long term (current) use of insulin: Secondary | ICD-10-CM | POA: Diagnosis not present

## 2021-10-29 DIAGNOSIS — E1142 Type 2 diabetes mellitus with diabetic polyneuropathy: Secondary | ICD-10-CM | POA: Diagnosis not present

## 2021-10-29 DIAGNOSIS — E1165 Type 2 diabetes mellitus with hyperglycemia: Secondary | ICD-10-CM | POA: Diagnosis not present

## 2021-10-29 DIAGNOSIS — N184 Chronic kidney disease, stage 4 (severe): Secondary | ICD-10-CM | POA: Diagnosis not present

## 2021-10-29 DIAGNOSIS — E039 Hypothyroidism, unspecified: Secondary | ICD-10-CM | POA: Diagnosis not present

## 2021-10-31 ENCOUNTER — Encounter (INDEPENDENT_AMBULATORY_CARE_PROVIDER_SITE_OTHER): Payer: No Typology Code available for payment source | Admitting: Ophthalmology

## 2021-10-31 DIAGNOSIS — H43813 Vitreous degeneration, bilateral: Secondary | ICD-10-CM | POA: Diagnosis not present

## 2021-10-31 DIAGNOSIS — I10 Essential (primary) hypertension: Secondary | ICD-10-CM | POA: Diagnosis not present

## 2021-10-31 DIAGNOSIS — H35033 Hypertensive retinopathy, bilateral: Secondary | ICD-10-CM | POA: Diagnosis not present

## 2021-10-31 DIAGNOSIS — E113513 Type 2 diabetes mellitus with proliferative diabetic retinopathy with macular edema, bilateral: Secondary | ICD-10-CM | POA: Diagnosis not present

## 2021-11-07 LAB — IRON,TIBC AND FERRITIN PANEL
%SAT: 15
Ferritin: 335
Iron: 37
TIBC: 250
UIBC: 213

## 2021-11-07 LAB — BASIC METABOLIC PANEL
BUN: 49 — AB (ref 4–21)
CO2: 24 — AB (ref 13–22)
Chloride: 105 (ref 99–108)
Creatinine: 3.8 — AB (ref 0.5–1.1)
Glucose: 145
Potassium: 4.9 mEq/L (ref 3.5–5.1)
Sodium: 139 (ref 137–147)

## 2021-11-07 LAB — PROTEIN / CREATININE RATIO, URINE: Creatinine, Urine: 32.5

## 2021-11-07 LAB — CBC AND DIFFERENTIAL: Hemoglobin: 9.8 — AB (ref 12.0–16.0)

## 2021-11-07 LAB — COMPREHENSIVE METABOLIC PANEL
Albumin: 4.2 (ref 3.5–5.0)
Calcium: 8.8 (ref 8.7–10.7)
eGFR: 13

## 2021-11-08 ENCOUNTER — Other Ambulatory Visit: Payer: Self-pay | Admitting: Internal Medicine

## 2021-11-14 ENCOUNTER — Encounter: Payer: Self-pay | Admitting: Internal Medicine

## 2021-11-20 ENCOUNTER — Encounter (HOSPITAL_COMMUNITY): Payer: No Typology Code available for payment source

## 2021-11-26 ENCOUNTER — Other Ambulatory Visit (HOSPITAL_COMMUNITY): Payer: Self-pay | Admitting: *Deleted

## 2021-11-27 ENCOUNTER — Inpatient Hospital Stay (HOSPITAL_COMMUNITY): Admission: RE | Admit: 2021-11-27 | Payer: No Typology Code available for payment source | Source: Ambulatory Visit

## 2021-11-28 ENCOUNTER — Encounter (INDEPENDENT_AMBULATORY_CARE_PROVIDER_SITE_OTHER): Payer: No Typology Code available for payment source | Admitting: Ophthalmology

## 2021-11-28 ENCOUNTER — Encounter (HOSPITAL_COMMUNITY): Payer: No Typology Code available for payment source

## 2021-11-28 DIAGNOSIS — E113513 Type 2 diabetes mellitus with proliferative diabetic retinopathy with macular edema, bilateral: Secondary | ICD-10-CM

## 2021-11-28 DIAGNOSIS — H35033 Hypertensive retinopathy, bilateral: Secondary | ICD-10-CM

## 2021-11-28 DIAGNOSIS — I10 Essential (primary) hypertension: Secondary | ICD-10-CM | POA: Diagnosis not present

## 2021-11-28 DIAGNOSIS — H43813 Vitreous degeneration, bilateral: Secondary | ICD-10-CM | POA: Diagnosis not present

## 2021-12-04 ENCOUNTER — Encounter (HOSPITAL_COMMUNITY): Payer: Self-pay

## 2021-12-04 ENCOUNTER — Encounter (HOSPITAL_COMMUNITY): Payer: No Typology Code available for payment source

## 2021-12-10 ENCOUNTER — Other Ambulatory Visit: Payer: Self-pay | Admitting: Internal Medicine

## 2021-12-13 ENCOUNTER — Other Ambulatory Visit (HOSPITAL_COMMUNITY): Payer: Self-pay

## 2021-12-14 ENCOUNTER — Encounter (HOSPITAL_COMMUNITY)
Admission: RE | Admit: 2021-12-14 | Discharge: 2021-12-14 | Disposition: A | Discharge status: Home / Self Care | Payer: No Typology Code available for payment source | Source: Ambulatory Visit | Attending: Nephrology | Admitting: Nephrology

## 2021-12-14 DIAGNOSIS — N189 Chronic kidney disease, unspecified: Secondary | ICD-10-CM | POA: Insufficient documentation

## 2021-12-14 DIAGNOSIS — D631 Anemia in chronic kidney disease: Secondary | ICD-10-CM | POA: Insufficient documentation

## 2021-12-14 MED ORDER — SODIUM CHLORIDE 0.9 % IV SOLN
510.0000 mg | INTRAVENOUS | Status: DC
  Administered 2021-12-14: 510 mg via INTRAVENOUS
  Filled 2021-12-14: qty 510

## 2021-12-21 ENCOUNTER — Ambulatory Visit (HOSPITAL_COMMUNITY)
Admission: RE | Admit: 2021-12-21 | Discharge: 2021-12-21 | Disposition: A | Discharge status: Home / Self Care | Payer: No Typology Code available for payment source | Source: Ambulatory Visit | Attending: Nephrology | Admitting: Nephrology

## 2021-12-21 DIAGNOSIS — N189 Chronic kidney disease, unspecified: Secondary | ICD-10-CM | POA: Diagnosis not present

## 2021-12-21 DIAGNOSIS — D631 Anemia in chronic kidney disease: Secondary | ICD-10-CM | POA: Insufficient documentation

## 2021-12-21 MED ORDER — SODIUM CHLORIDE 0.9 % IV SOLN
510.0000 mg | INTRAVENOUS | Status: DC
  Administered 2021-12-21: 510 mg via INTRAVENOUS
  Filled 2021-12-21: qty 510

## 2021-12-26 ENCOUNTER — Encounter (INDEPENDENT_AMBULATORY_CARE_PROVIDER_SITE_OTHER): Payer: No Typology Code available for payment source | Admitting: Ophthalmology

## 2021-12-31 ENCOUNTER — Encounter: Payer: Self-pay | Admitting: Internal Medicine

## 2021-12-31 ENCOUNTER — Other Ambulatory Visit: Payer: No Typology Code available for payment source

## 2022-01-03 ENCOUNTER — Encounter (INDEPENDENT_AMBULATORY_CARE_PROVIDER_SITE_OTHER): Payer: No Typology Code available for payment source | Admitting: Ophthalmology

## 2022-01-03 DIAGNOSIS — H35372 Puckering of macula, left eye: Secondary | ICD-10-CM | POA: Diagnosis not present

## 2022-01-03 DIAGNOSIS — H43813 Vitreous degeneration, bilateral: Secondary | ICD-10-CM

## 2022-01-03 DIAGNOSIS — E113513 Type 2 diabetes mellitus with proliferative diabetic retinopathy with macular edema, bilateral: Secondary | ICD-10-CM

## 2022-01-03 DIAGNOSIS — I10 Essential (primary) hypertension: Secondary | ICD-10-CM | POA: Diagnosis not present

## 2022-01-03 DIAGNOSIS — H35033 Hypertensive retinopathy, bilateral: Secondary | ICD-10-CM

## 2022-01-04 ENCOUNTER — Ambulatory Visit: Payer: No Typology Code available for payment source | Admitting: Internal Medicine

## 2022-01-31 ENCOUNTER — Encounter (INDEPENDENT_AMBULATORY_CARE_PROVIDER_SITE_OTHER): Payer: No Typology Code available for payment source | Admitting: Ophthalmology

## 2022-01-31 DIAGNOSIS — H43813 Vitreous degeneration, bilateral: Secondary | ICD-10-CM | POA: Diagnosis not present

## 2022-01-31 DIAGNOSIS — H35033 Hypertensive retinopathy, bilateral: Secondary | ICD-10-CM

## 2022-01-31 DIAGNOSIS — I10 Essential (primary) hypertension: Secondary | ICD-10-CM

## 2022-01-31 DIAGNOSIS — E113513 Type 2 diabetes mellitus with proliferative diabetic retinopathy with macular edema, bilateral: Secondary | ICD-10-CM | POA: Diagnosis not present

## 2022-02-11 ENCOUNTER — Other Ambulatory Visit: Payer: Self-pay | Admitting: Internal Medicine

## 2022-02-12 IMAGING — CT CT 3D INDEPENDENT WKST
3 of 4 series · 15 of 33 positions shown, 18 images · non-contrast
Comparison: None.

CLINICAL DATA: Left knee pain after fall 4 days ago

EXAM:
CT OF THE LOWER LEFT EXTREMITY WITHOUT CONTRAST
3-DIMENSIONAL CT IMAGE RENDERING ON ACQUISITION WORKSTATION
TECHNIQUE: Multidetector CT imaging of the left knee was performed according to
the standard protocol.
3-dimensional CT images were rendered by post-processing of the
original CT data on an acquisition workstation. The 3-dimensional CT
images were interpreted and findings were reported in the
accompanying complete CT report for this study

[Series 9: lfov lower extremity 2.00 br40 s3 soft · coronal · 0.32mm/px · 3 of 87 slices shown (1 of 2)]
[im 18/87  bone]
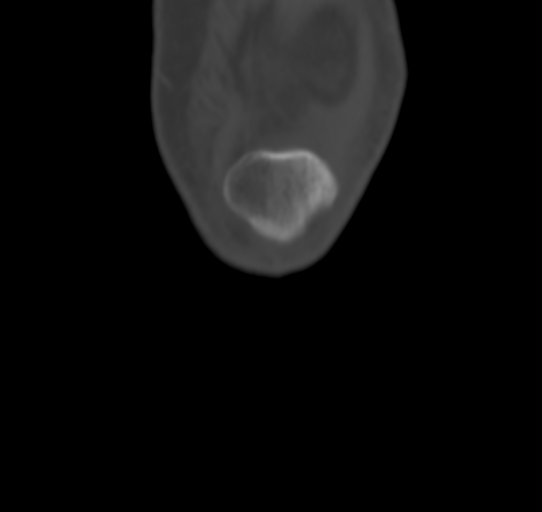
[im 35/87  bone]
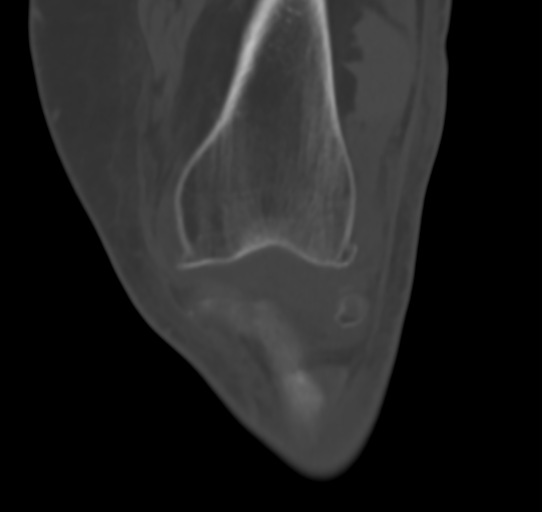
[im 52/87  bone]
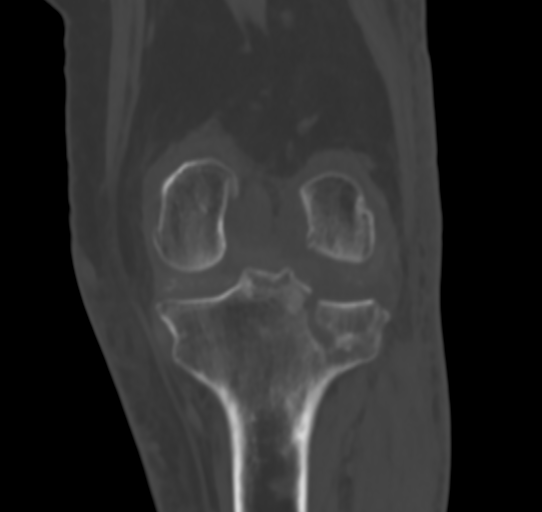

[Series 13: lfov lower extremity 2.00 br40 s3 soft · sagittal · 0.32mm/px · 5 of 87 slices shown, 6 images (2 of 2)]
[im 29/87  bone]
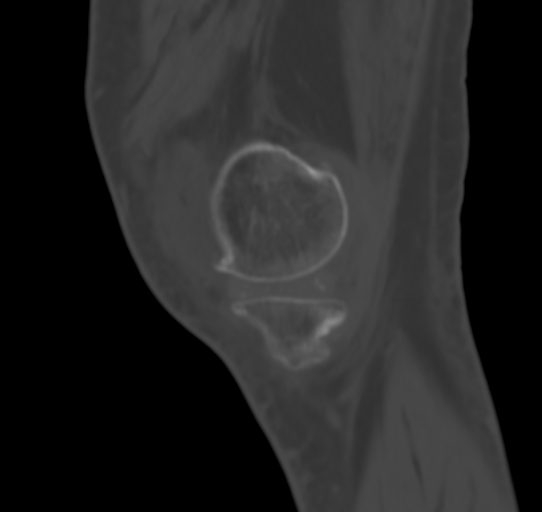
[im 36/87  bone]
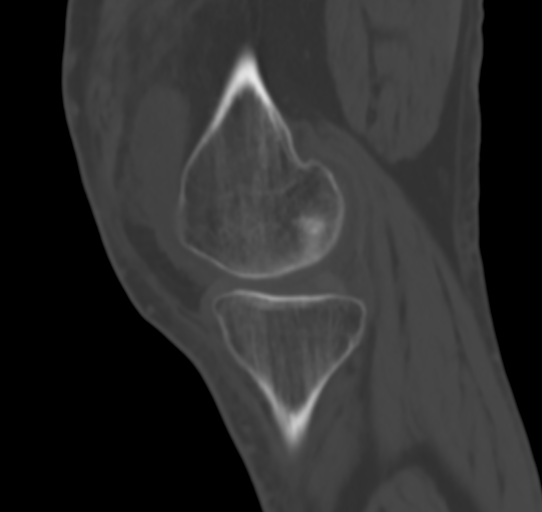
[im 44/87  soft-tissue]
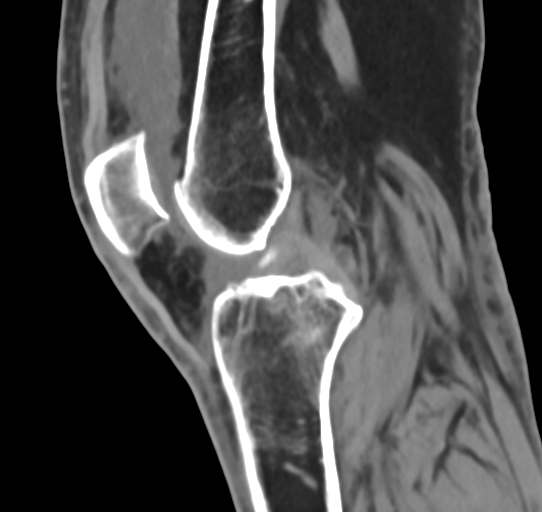
[im 44/87  bone]
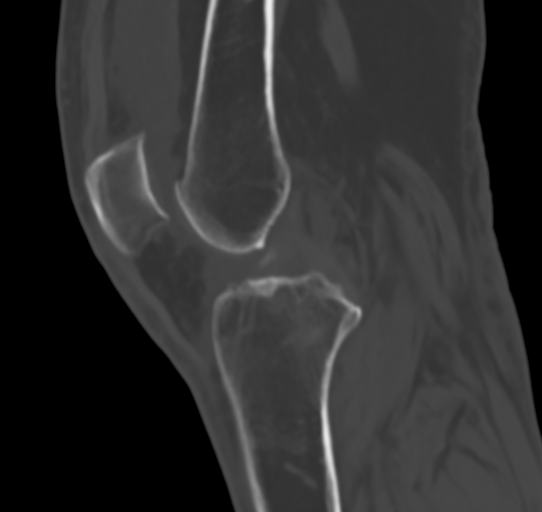
[im 51/87  bone]
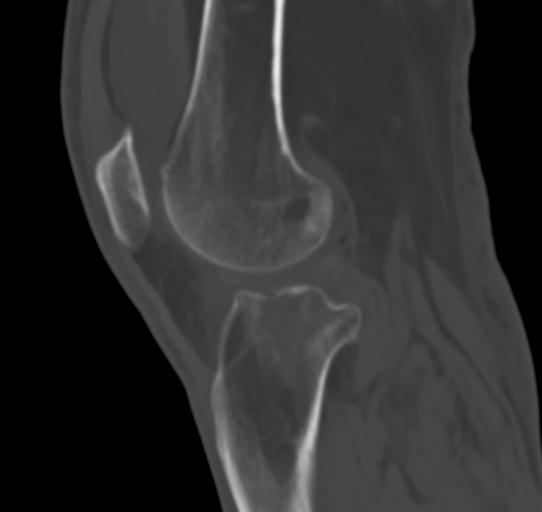
[im 58/87  bone]
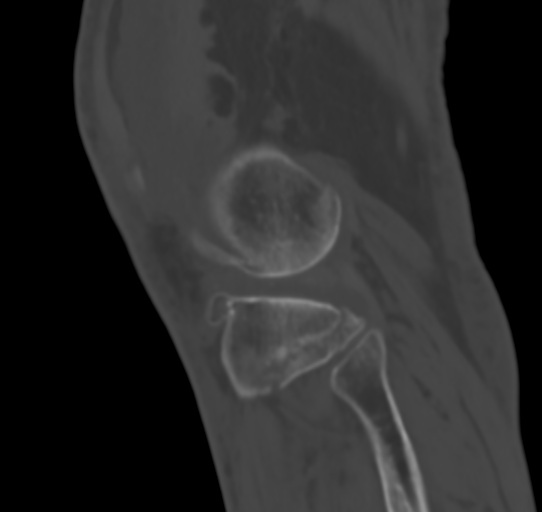

[Series 16: lfov lower extremity 0.60 br40 s3 soft thin · axial · 0.34mm/px · z∈[+1065,+1197]mm · 7 of 276 slices shown, 9 images]
[im 28/276  soft-tissue]
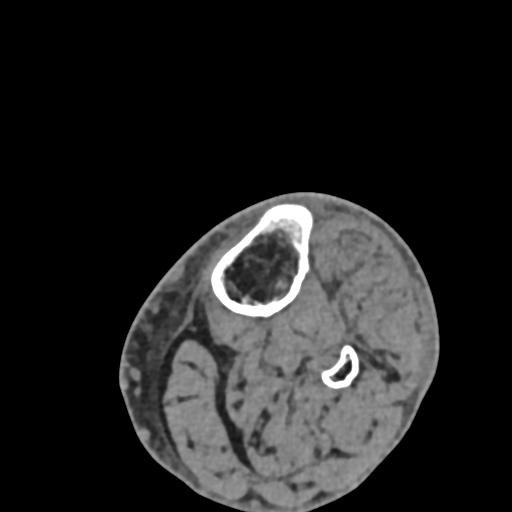
[im 28/276  bone]
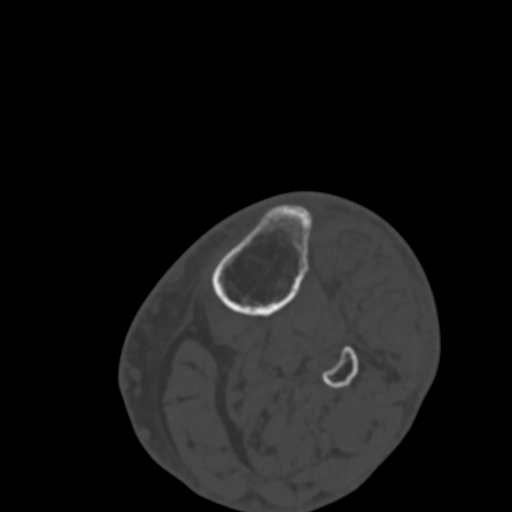
[im 56/276  bone]
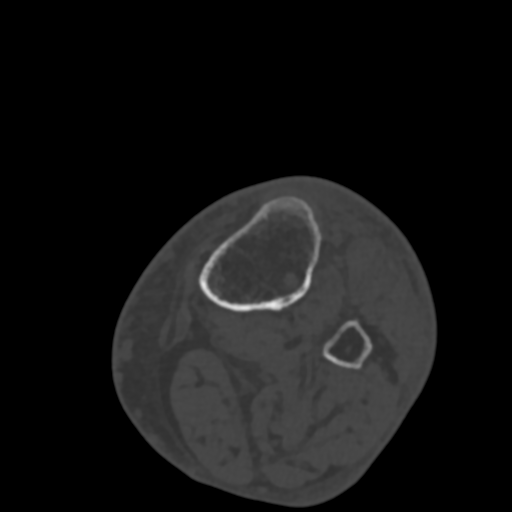
[im 111/276  bone]
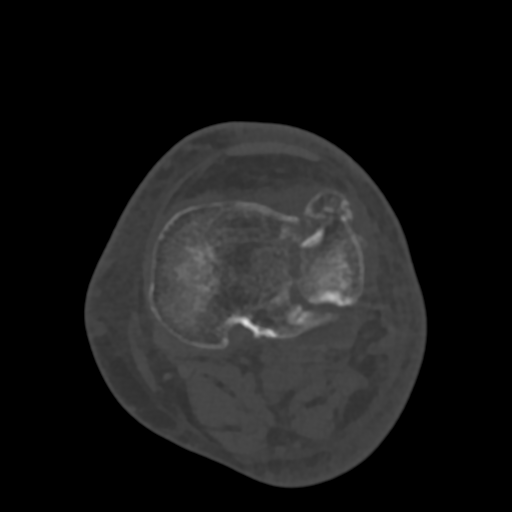
[im 138/276  bone]
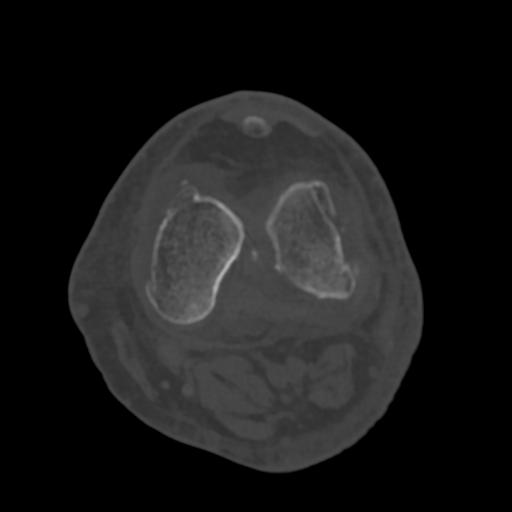
[im 166/276  soft-tissue]
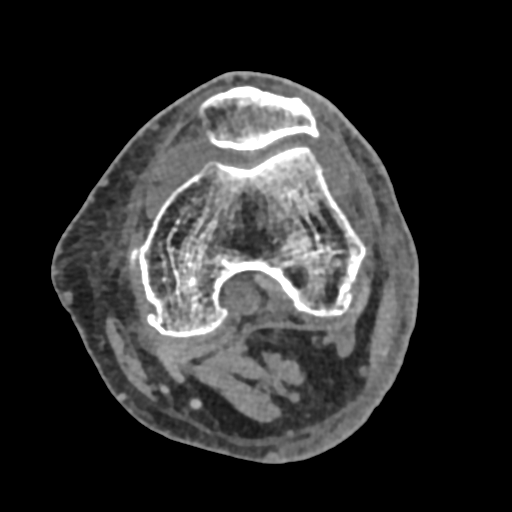
[im 166/276  bone]
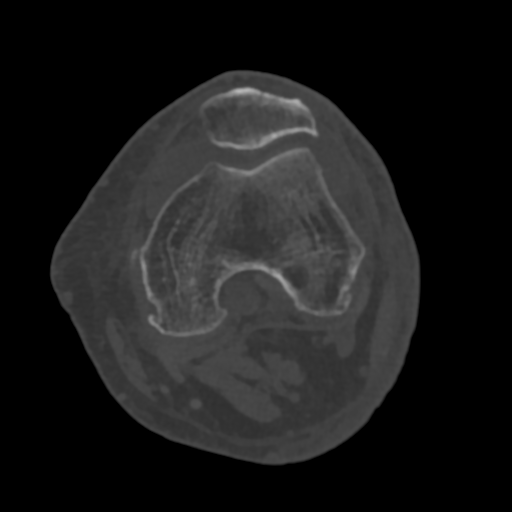
[im 221/276  bone]
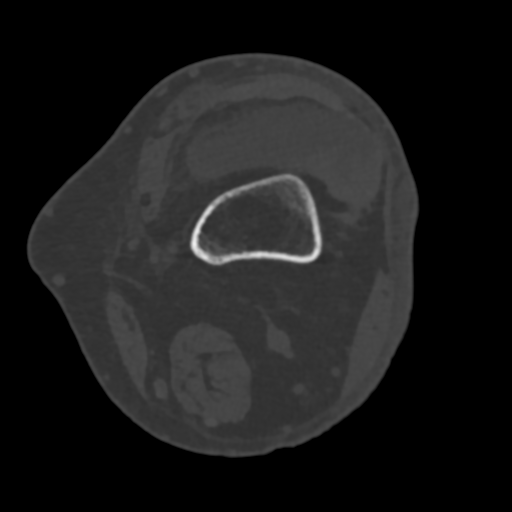
[im 248/276  bone]
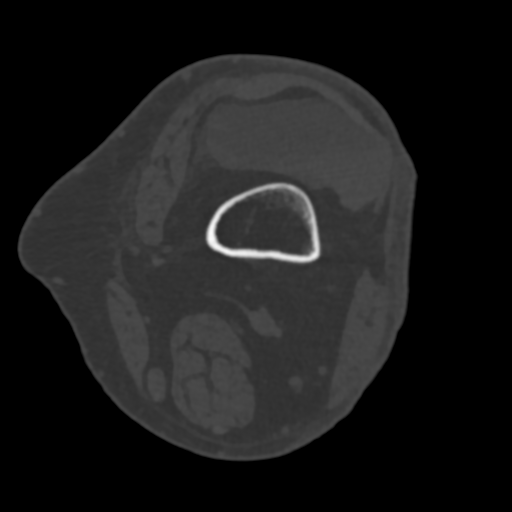

[15 of 33 positions shown; findings below may reference images not displayed]

FINDINGS: Bones/Joint/Cartilage

Acute lateral tibial plateau fracture. Fracture is wedge-shaped with
up to 3 mm of articular-surface depression. 3-4 mm of
articular-surface diastasis centrally. No fracture extension into
the tibial eminence or medial tibial plateau. Fracture line closely
approximates but does not definitively extend into the proximal
tibiofibular joint. Proximal fibula intact without fracture. The
patella and visualized distal femur are intact without fracture.

Large layering knee joint lipohemarthrosis. Mild tricompartmental
osteoarthritis. There is chondrocalcinosis within the tibiofemoral
compartments.

Ligaments

Suboptimally assessed by CT.

Muscles and Tendons

No acute musculotendinous injury by CT.

Soft tissues

Diffuse soft tissue edema. No well-defined fluid collection or
hematoma.
IMPRESSION: 1. Acute lateral tibial plateau fracture with up to 3 mm of
articular-surface depression (Schatzker I)
2. Large knee joint lipohemarthrosis.
3. Mild tricompartmental osteoarthritis.

## 2022-02-12 IMAGING — CT CT KNEE*L* W/O CM
3 of 4 series · 15 of 33 positions shown, 18 images · non-contrast
Comparison: None.

CLINICAL DATA: Left knee pain after fall 4 days ago

EXAM:
CT OF THE LOWER LEFT EXTREMITY WITHOUT CONTRAST
3-DIMENSIONAL CT IMAGE RENDERING ON ACQUISITION WORKSTATION
TECHNIQUE: Multidetector CT imaging of the left knee was performed according to
the standard protocol.
3-dimensional CT images were rendered by post-processing of the
original CT data on an acquisition workstation. The 3-dimensional CT
images were interpreted and findings were reported in the
accompanying complete CT report for this study

[Series 9: lfov lower extremity 2.00 br40 s3 soft · coronal · 0.32mm/px · 3 of 87 slices shown (1 of 2)]
[im 18/87  bone]
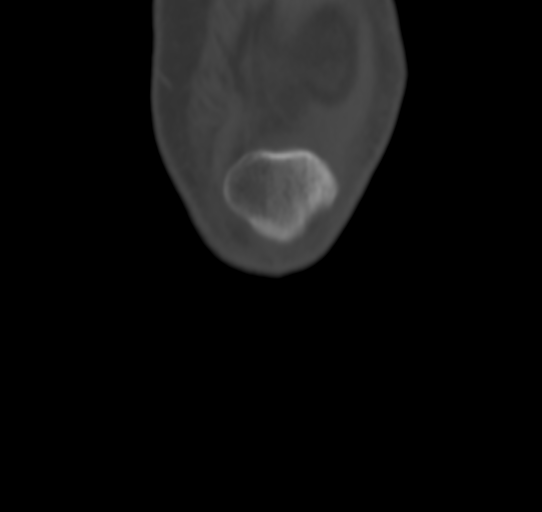
[im 35/87  bone]
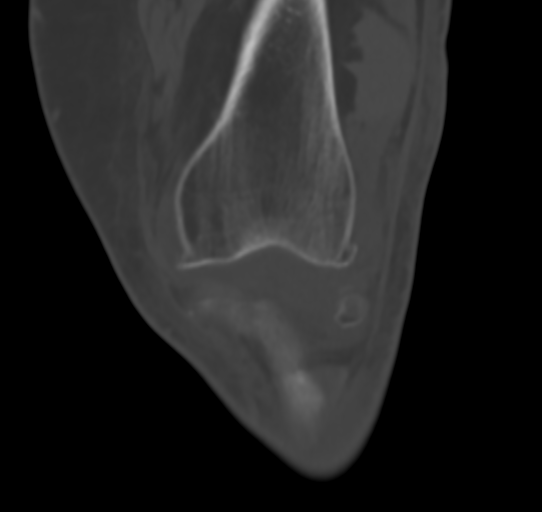
[im 52/87  bone]
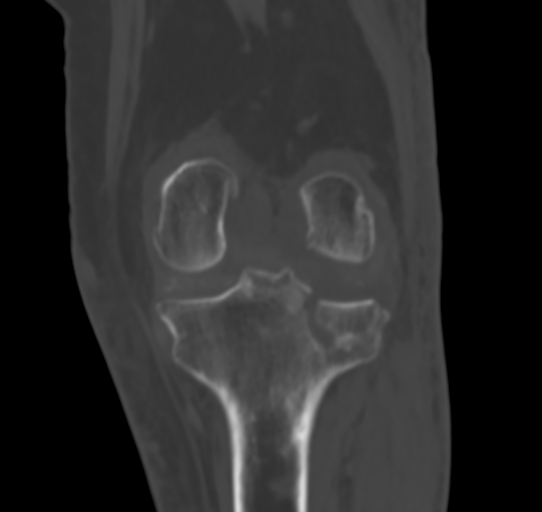

[Series 13: lfov lower extremity 2.00 br40 s3 soft · sagittal · 0.32mm/px · 5 of 87 slices shown, 6 images (2 of 2)]
[im 29/87  bone]
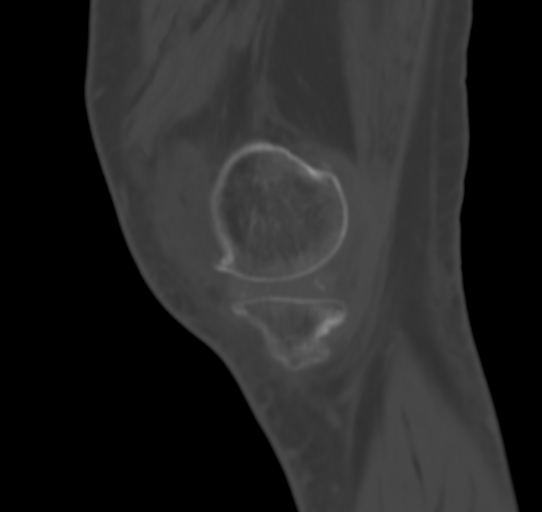
[im 36/87  bone]
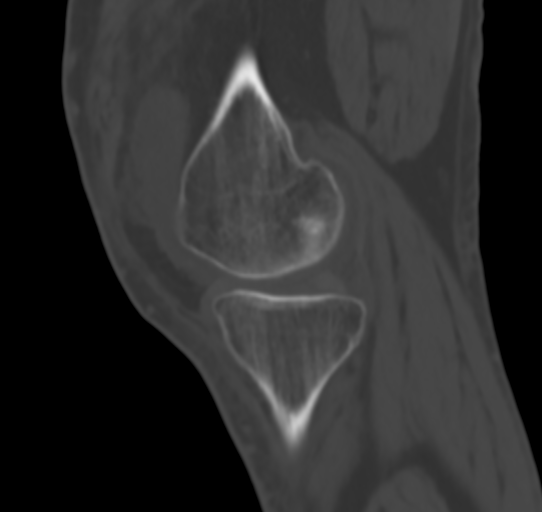
[im 44/87  soft-tissue]
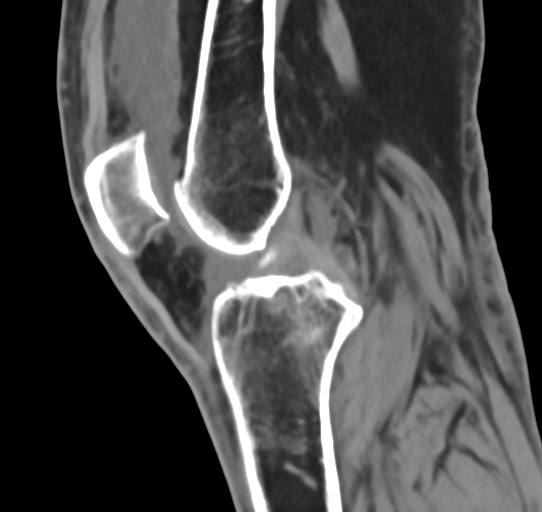
[im 44/87  bone]
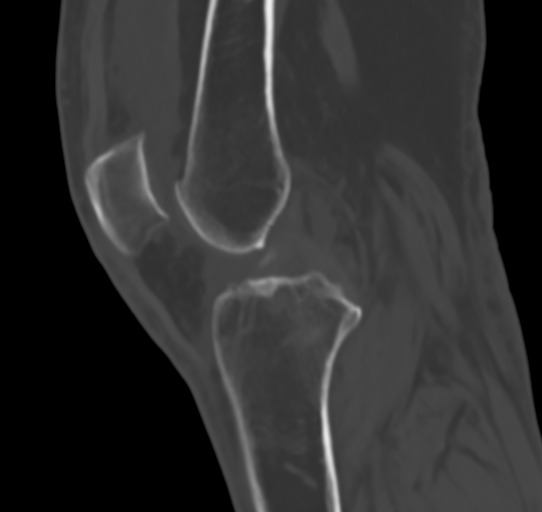
[im 51/87  bone]
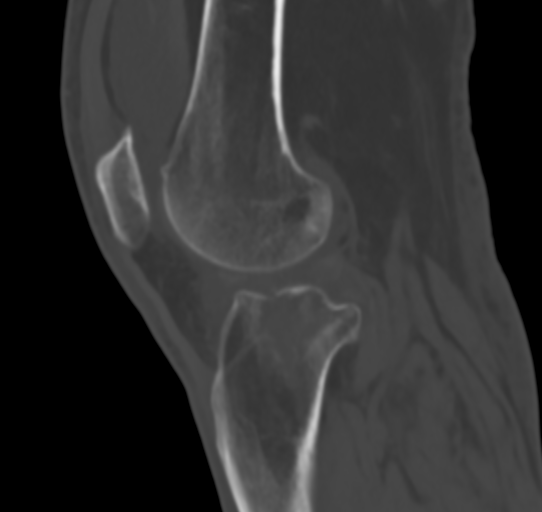
[im 58/87  bone]
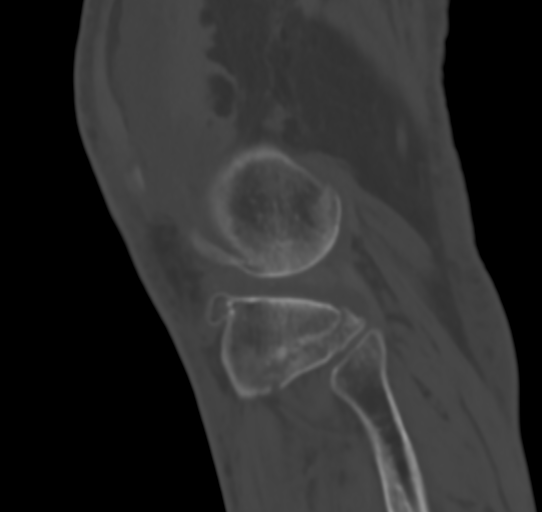

[Series 16: lfov lower extremity 0.60 br40 s3 soft thin · axial · 0.34mm/px · z∈[+1065,+1197]mm · 7 of 276 slices shown, 9 images]
[im 28/276  soft-tissue]
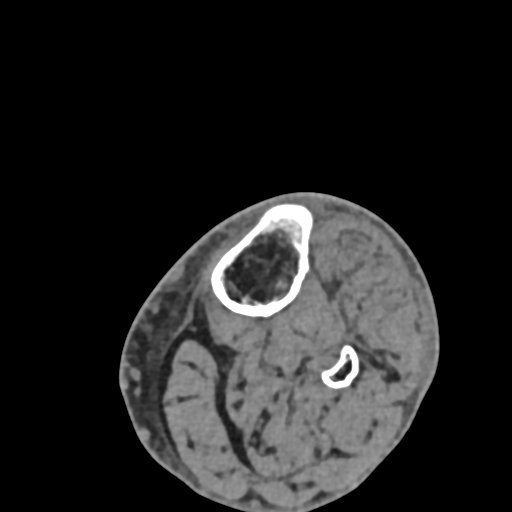
[im 28/276  bone]
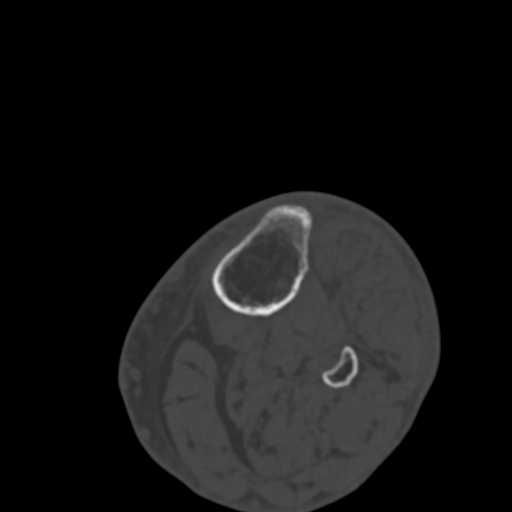
[im 56/276  bone]
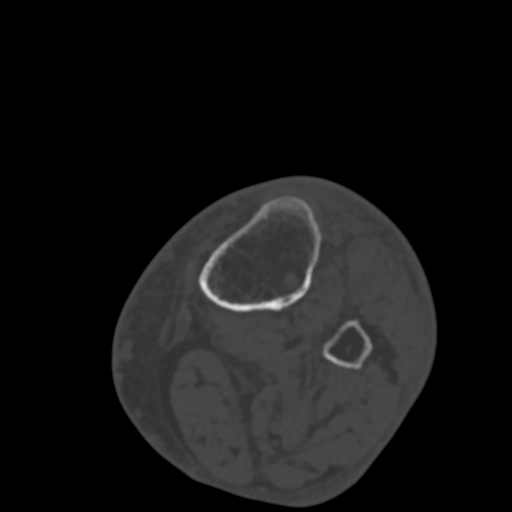
[im 111/276  bone]
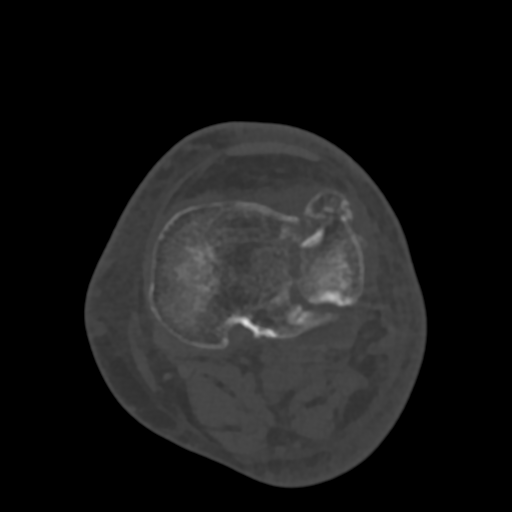
[im 138/276  bone]
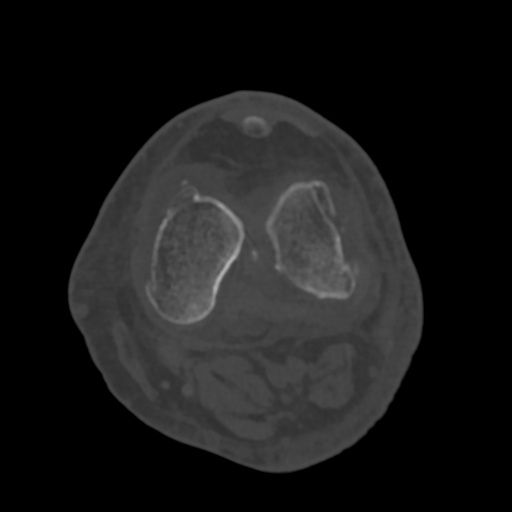
[im 166/276  soft-tissue]
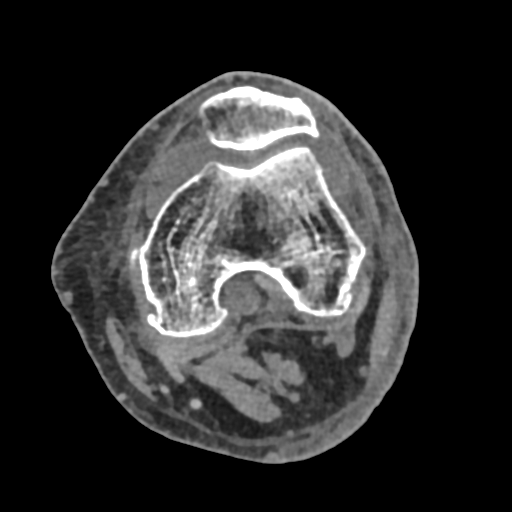
[im 166/276  bone]
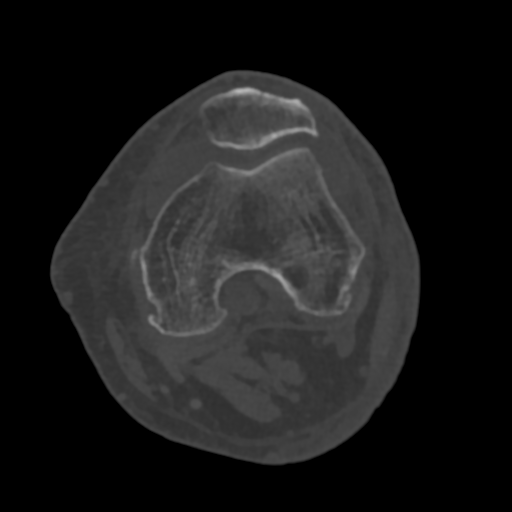
[im 221/276  bone]
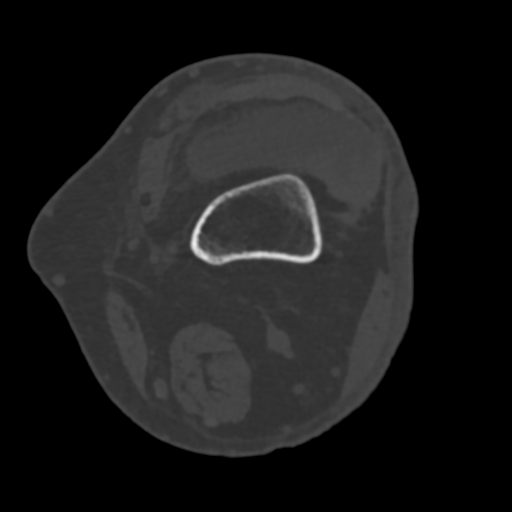
[im 248/276  bone]
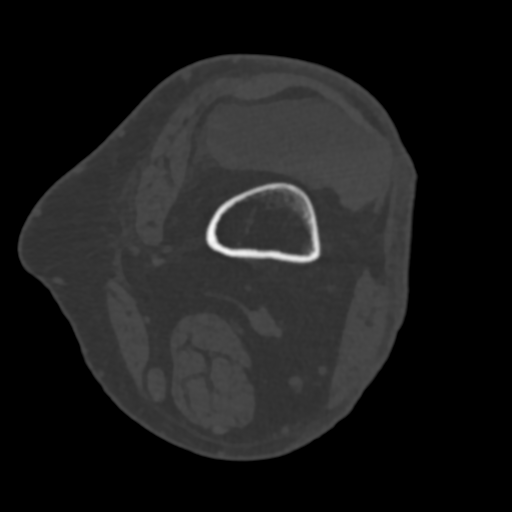

[15 of 33 positions shown; findings below may reference images not displayed]

FINDINGS: Bones/Joint/Cartilage

Acute lateral tibial plateau fracture. Fracture is wedge-shaped with
up to 3 mm of articular-surface depression. 3-4 mm of
articular-surface diastasis centrally. No fracture extension into
the tibial eminence or medial tibial plateau. Fracture line closely
approximates but does not definitively extend into the proximal
tibiofibular joint. Proximal fibula intact without fracture. The
patella and visualized distal femur are intact without fracture.

Large layering knee joint lipohemarthrosis. Mild tricompartmental
osteoarthritis. There is chondrocalcinosis within the tibiofemoral
compartments.

Ligaments

Suboptimally assessed by CT.

Muscles and Tendons

No acute musculotendinous injury by CT.

Soft tissues

Diffuse soft tissue edema. No well-defined fluid collection or
hematoma.
IMPRESSION: 1. Acute lateral tibial plateau fracture with up to 3 mm of
articular-surface depression (Schatzker I)
2. Large knee joint lipohemarthrosis.
3. Mild tricompartmental osteoarthritis.

## 2022-02-28 ENCOUNTER — Encounter (INDEPENDENT_AMBULATORY_CARE_PROVIDER_SITE_OTHER): Payer: No Typology Code available for payment source | Admitting: Ophthalmology

## 2022-03-11 ENCOUNTER — Encounter (INDEPENDENT_AMBULATORY_CARE_PROVIDER_SITE_OTHER): Payer: No Typology Code available for payment source | Admitting: Ophthalmology

## 2022-03-19 DIAGNOSIS — N179 Acute kidney failure, unspecified: Secondary | ICD-10-CM | POA: Diagnosis not present

## 2022-04-05 ENCOUNTER — Other Ambulatory Visit: Payer: Self-pay | Admitting: Internal Medicine

## 2022-04-25 ENCOUNTER — Other Ambulatory Visit: Payer: Self-pay | Admitting: Internal Medicine

## 2022-04-25 DIAGNOSIS — Z13820 Encounter for screening for osteoporosis: Secondary | ICD-10-CM | POA: Diagnosis not present

## 2022-04-25 DIAGNOSIS — E1165 Type 2 diabetes mellitus with hyperglycemia: Secondary | ICD-10-CM | POA: Diagnosis not present

## 2022-04-25 DIAGNOSIS — N184 Chronic kidney disease, stage 4 (severe): Secondary | ICD-10-CM | POA: Diagnosis not present

## 2022-04-25 DIAGNOSIS — E039 Hypothyroidism, unspecified: Secondary | ICD-10-CM | POA: Diagnosis not present

## 2022-04-25 DIAGNOSIS — I10 Essential (primary) hypertension: Secondary | ICD-10-CM | POA: Diagnosis not present

## 2022-04-25 DIAGNOSIS — E1142 Type 2 diabetes mellitus with diabetic polyneuropathy: Secondary | ICD-10-CM | POA: Diagnosis not present

## 2022-04-25 DIAGNOSIS — Z794 Long term (current) use of insulin: Secondary | ICD-10-CM | POA: Diagnosis not present

## 2022-05-20 DIAGNOSIS — N179 Acute kidney failure, unspecified: Secondary | ICD-10-CM | POA: Diagnosis not present

## 2022-05-20 LAB — BASIC METABOLIC PANEL
BUN: 76 — AB (ref 4–21)
CO2: 23 — AB (ref 13–22)
Chloride: 102 (ref 99–108)
Creatinine: 5.4 — AB (ref 0.5–1.1)
Glucose: 206
Potassium: 4.3 mEq/L (ref 3.5–5.1)
Sodium: 139 (ref 137–147)

## 2022-05-20 LAB — CBC AND DIFFERENTIAL: Hemoglobin: 10.2 — AB (ref 12.0–16.0)

## 2022-05-20 LAB — COMPREHENSIVE METABOLIC PANEL
Albumin: 4.2 (ref 3.5–5.0)
eGFR: 8

## 2022-05-20 LAB — IRON,TIBC AND FERRITIN PANEL
%SAT: 15
Ferritin: 733
Iron: 33
TIBC: 227
UIBC: 194

## 2022-05-20 LAB — HEMOGLOBIN A1C: Hemoglobin A1C: 8.1

## 2022-05-20 LAB — PROTEIN / CREATININE RATIO, URINE: Creatinine, Urine: 50.9

## 2022-05-23 ENCOUNTER — Other Ambulatory Visit: Payer: Self-pay | Admitting: Internal Medicine

## 2022-06-04 ENCOUNTER — Encounter: Payer: Self-pay | Admitting: Internal Medicine

## 2022-06-11 ENCOUNTER — Other Ambulatory Visit (HOSPITAL_COMMUNITY): Payer: Self-pay | Admitting: *Deleted

## 2022-06-12 ENCOUNTER — Encounter (HOSPITAL_COMMUNITY): Payer: No Typology Code available for payment source

## 2022-06-19 ENCOUNTER — Encounter (HOSPITAL_COMMUNITY): Payer: No Typology Code available for payment source

## 2022-06-24 ENCOUNTER — Other Ambulatory Visit: Payer: Self-pay | Admitting: Internal Medicine

## 2022-07-18 DIAGNOSIS — D631 Anemia in chronic kidney disease: Secondary | ICD-10-CM | POA: Diagnosis not present

## 2022-07-18 DIAGNOSIS — E1122 Type 2 diabetes mellitus with diabetic chronic kidney disease: Secondary | ICD-10-CM | POA: Diagnosis not present

## 2022-07-18 DIAGNOSIS — N184 Chronic kidney disease, stage 4 (severe): Secondary | ICD-10-CM | POA: Diagnosis not present

## 2022-07-18 DIAGNOSIS — E039 Hypothyroidism, unspecified: Secondary | ICD-10-CM | POA: Diagnosis not present

## 2022-07-18 DIAGNOSIS — I129 Hypertensive chronic kidney disease with stage 1 through stage 4 chronic kidney disease, or unspecified chronic kidney disease: Secondary | ICD-10-CM | POA: Diagnosis not present

## 2022-07-18 DIAGNOSIS — N179 Acute kidney failure, unspecified: Secondary | ICD-10-CM | POA: Diagnosis not present

## 2022-07-19 LAB — LAB REPORT - SCANNED
A1c: 7.6
EGFR: 8

## 2022-07-25 DIAGNOSIS — Z7682 Awaiting organ transplant status: Secondary | ICD-10-CM | POA: Diagnosis not present

## 2022-07-25 DIAGNOSIS — I4510 Unspecified right bundle-branch block: Secondary | ICD-10-CM | POA: Diagnosis not present

## 2022-07-25 DIAGNOSIS — Z01818 Encounter for other preprocedural examination: Secondary | ICD-10-CM | POA: Diagnosis not present

## 2022-07-25 DIAGNOSIS — J810 Acute pulmonary edema: Secondary | ICD-10-CM | POA: Diagnosis not present

## 2022-07-25 DIAGNOSIS — N200 Calculus of kidney: Secondary | ICD-10-CM | POA: Diagnosis not present

## 2022-07-25 DIAGNOSIS — I517 Cardiomegaly: Secondary | ICD-10-CM | POA: Diagnosis not present

## 2022-07-25 DIAGNOSIS — J90 Pleural effusion, not elsewhere classified: Secondary | ICD-10-CM | POA: Diagnosis not present

## 2022-07-26 ENCOUNTER — Other Ambulatory Visit (HOSPITAL_COMMUNITY): Payer: Self-pay

## 2022-07-29 ENCOUNTER — Encounter (HOSPITAL_COMMUNITY): Payer: No Typology Code available for payment source

## 2022-08-05 ENCOUNTER — Encounter (HOSPITAL_COMMUNITY): Payer: No Typology Code available for payment source

## 2022-08-08 DIAGNOSIS — I10 Essential (primary) hypertension: Secondary | ICD-10-CM | POA: Diagnosis not present

## 2022-08-08 DIAGNOSIS — Z01818 Encounter for other preprocedural examination: Secondary | ICD-10-CM | POA: Diagnosis not present

## 2022-08-08 DIAGNOSIS — E669 Obesity, unspecified: Secondary | ICD-10-CM | POA: Diagnosis not present

## 2022-08-08 DIAGNOSIS — E1122 Type 2 diabetes mellitus with diabetic chronic kidney disease: Secondary | ICD-10-CM | POA: Diagnosis not present

## 2022-08-08 DIAGNOSIS — N185 Chronic kidney disease, stage 5: Secondary | ICD-10-CM | POA: Diagnosis not present

## 2022-08-08 DIAGNOSIS — Z794 Long term (current) use of insulin: Secondary | ICD-10-CM | POA: Diagnosis not present

## 2022-08-23 ENCOUNTER — Other Ambulatory Visit: Payer: Self-pay | Admitting: *Deleted

## 2022-08-23 DIAGNOSIS — N186 End stage renal disease: Secondary | ICD-10-CM

## 2022-08-26 DIAGNOSIS — E1165 Type 2 diabetes mellitus with hyperglycemia: Secondary | ICD-10-CM | POA: Diagnosis not present

## 2022-08-26 DIAGNOSIS — Z794 Long term (current) use of insulin: Secondary | ICD-10-CM | POA: Diagnosis not present

## 2022-08-26 DIAGNOSIS — I10 Essential (primary) hypertension: Secondary | ICD-10-CM | POA: Diagnosis not present

## 2022-08-26 DIAGNOSIS — E1122 Type 2 diabetes mellitus with diabetic chronic kidney disease: Secondary | ICD-10-CM | POA: Diagnosis not present

## 2022-08-26 DIAGNOSIS — E039 Hypothyroidism, unspecified: Secondary | ICD-10-CM | POA: Diagnosis not present

## 2022-08-26 DIAGNOSIS — N184 Chronic kidney disease, stage 4 (severe): Secondary | ICD-10-CM | POA: Diagnosis not present

## 2022-08-26 DIAGNOSIS — E1142 Type 2 diabetes mellitus with diabetic polyneuropathy: Secondary | ICD-10-CM | POA: Diagnosis not present

## 2022-08-29 DIAGNOSIS — Z0100 Encounter for examination of eyes and vision without abnormal findings: Secondary | ICD-10-CM | POA: Diagnosis not present

## 2022-08-29 DIAGNOSIS — H5203 Hypermetropia, bilateral: Secondary | ICD-10-CM | POA: Diagnosis not present

## 2022-08-29 LAB — HM DIABETES EYE EXAM

## 2022-08-30 ENCOUNTER — Telehealth: Payer: Self-pay

## 2022-08-30 ENCOUNTER — Ambulatory Visit: Payer: Medicare HMO | Admitting: Vascular Surgery

## 2022-08-30 ENCOUNTER — Ambulatory Visit (INDEPENDENT_AMBULATORY_CARE_PROVIDER_SITE_OTHER)
Admission: RE | Admit: 2022-08-30 | Discharge: 2022-08-30 | Disposition: A | Discharge status: Home / Self Care | Payer: Medicare HMO | Source: Ambulatory Visit | Attending: Vascular Surgery | Admitting: Vascular Surgery

## 2022-08-30 ENCOUNTER — Encounter: Payer: Self-pay | Admitting: Vascular Surgery

## 2022-08-30 ENCOUNTER — Ambulatory Visit (HOSPITAL_COMMUNITY)
Admission: RE | Admit: 2022-08-30 | Discharge: 2022-08-30 | Disposition: A | Discharge status: Home / Self Care | Payer: Medicare HMO | Source: Ambulatory Visit | Attending: Vascular Surgery | Admitting: Vascular Surgery

## 2022-08-30 VITALS — BP 176/84 | HR 77 | Temp 97.9°F | Resp 20 | Ht 60.0 in | Wt 172.0 lb

## 2022-08-30 DIAGNOSIS — N186 End stage renal disease: Secondary | ICD-10-CM

## 2022-08-30 NOTE — Progress Notes (Signed)
Office Note     CC:  [id number] Requesting Provider:  Margaree Mackintosh, MD  HPI: Allison Patel is a Right handed 67 y.o. (01-29-1956) female with kidney disease who presents at the request of [name], Allison Cole, MD for permanent HD access. The patient has had no prior access procedures.   On exam, she was doing well accompanied by her husband.  [place], she has lived in the [place] area her entire life.  Her children went to [place].  Her husband works at [employer] in Editor, commissioning.  No surgical history in bilateral upper extremities, no tunneled dialysis catheters  Past Medical History:  Diagnosis Date   Chronic kidney disease    Diabetes mellitus without complication (HCC)    Hyperlipidemia    Hypertension    Hypothyroidism     Past Surgical History:  Procedure Laterality Date   ABDOMINAL HYSTERECTOMY     CESAREAN SECTION     2   CHOLECYSTECTOMY     IR FLUORO GUIDE CV LINE RIGHT  10/21/2018   IR US GUIDE VASC ACCESS RIGHT  10/21/2018    Social History   Socioeconomic History   Marital status: Married    Spouse name: Not on file   Number of children: Not on file   Years of education: Not on file   Highest education level: Not on file  Occupational History   Not on file  Tobacco Use   Smoking status: Never   Smokeless tobacco: Never  Vaping Use   Vaping Use: Never used  Substance and Sexual Activity   Alcohol use: No   Drug use: No   Sexual activity: Not on file  Other Topics Concern   Not on file  Social History Narrative   Social history: She is a Careers information officer at [employer].  Husband is an Designer, fashion/clothing.  She completed 2 years of college.  Does not smoke or consume alcohol.  She resides in [place] and has 2 adult daughters.       Family history: Mother died at age 35 with congestive heart failure.  Father died at age 58 with complications of pneumonia and heart failure.  Another brother in good health.  No sisters.    Social Determinants of Health   Financial Resource Strain: Not on file  Food Insecurity: Not on file  Transportation Needs: Not on file  Physical Activity: Not on file  Stress: Not on file  Social Connections: Not on file  Intimate Partner Violence: Not on file   Family History  Problem Relation Age of Onset   Diabetes Mother    Hypertension Mother    Congestive Heart Failure Mother    Hypertension Father    Pneumonia Father    Pancreatic cancer Brother     Current Outpatient Medications  Medication Sig Dispense Refill   amLODipine (NORVASC) 10 MG tablet TAKE ONE TABLET BY MOUTH ONE TIME DAILY 90 tablet 1   cloNIDine (CATAPRES) 0.2 MG tablet TAKE ONE TABLET BY MOUTH TWICE A DAY 180 tablet 3   Continuous Blood Gluc Receiver (DEXCOM G6 RECEIVER) DEVI Dexcom G6 Receiver misc  USE AS DIRECTED     Continuous Blood Gluc Sensor (DEXCOM G6 SENSOR) MISC Dexcom G6 Sensor device  CHANGE SENSOR EVERY 10 DAYS     Continuous Blood Gluc Transmit (DEXCOM G6 TRANSMITTER) MISC Dexcom G6 Transmitter device  USE AS DIRECTED     FARXIGA 10 MG TABS tablet Take 10 mg by mouth daily.  furosemide (LASIX) 40 MG tablet Take 40 mg by mouth.     insulin lispro (HUMALOG KWIKPEN) 100 UNIT/ML KwikPen Inject 0.1 mLs (10 Units total) into the skin 2 (two) times a day. For glucose of 150-200 use two units, for 201 to 250 use three units, for 251 to 300 use five units, for 301 to 350 use seven units, for 351 or greater use nine units. 15 mL 11   Insulin Pen Needle 31G X 6 MM MISC 1 Device by Does not apply route 2 (two) times daily. 60 each 3   levothyroxine (SYNTHROID) 75 MCG tablet Take 1 tablet (75 mcg total) by mouth daily. 90 tablet 3   metoprolol tartrate (LOPRESSOR) 25 MG tablet TAKE ONE-HALF TABLET BY MOUTH TWICE A DAY 90 tablet 1   rosuvastatin (CRESTOR) 40 MG tablet TAKE ONE TABLET BY MOUTH ONE TIME DAILY 90 tablet 1   Vitamin D, Ergocalciferol, (DRISDOL) 1.25 MG (50000 UNIT) CAPS capsule TAKE ONE  CAPSULE BY MOUTH EVERY WEEK 12 capsule 1   Insulin Glargine (LANTUS) 100 UNIT/ML Solostar Pen Inject 10 Units into the skin daily at 10 pm. (Patient taking differently: Inject 34 Units into the skin daily at 10 pm.) 3 mL 0   No current facility-administered medications for this visit.    Allergies  Allergen Reactions   Macrobid [Nitrofurantoin Macrocrystal] Rash     REVIEW OF SYSTEMS:  [X]  denotes positive finding, [ ]  denotes negative finding Cardiac  Comments:  Chest pain or chest pressure:    Shortness of breath upon exertion:    Short of breath when lying flat:    Irregular heart rhythm:        Vascular    Pain in calf, thigh, or hip brought on by ambulation:    Pain in feet at night that wakes you up from your sleep:     Blood clot in your veins:    Leg swelling:         Pulmonary    Oxygen at home:    Productive cough:     Wheezing:         Neurologic    Sudden weakness in arms or legs:     Sudden numbness in arms or legs:     Sudden onset of difficulty speaking or slurred speech:    Temporary loss of vision in one eye:     Problems with dizziness:         Gastrointestinal    Blood in stool:     Vomited blood:         Genitourinary    Burning when urinating:     Blood in urine:        Psychiatric    Major depression:         Hematologic    Bleeding problems:    Problems with blood clotting too easily:        Skin    Rashes or ulcers:        Constitutional    Fever or chills:      PHYSICAL EXAMINATION:  Vitals:   08/30/22 1444  BP: (!) 176/84  Pulse: 77  Resp: 20  Temp: 97.9 F (36.6 C)  SpO2: 98%  Weight: 172 lb (78 kg)  Height: 5' (1.524 m)    General:  WDWN in NAD; vital signs documented above Gait: Not observed HENT: WNL, normocephalic Pulmonary: normal non-labored breathing , without Rales, rhonchi,  wheezing Cardiac: regular HR,  Abdomen: soft, NT, no  masses Skin: without rashes Vascular Exam/Pulses:  Right Left  Radial 2+  (normal) 2+ (normal)  Ulnar    Femoral    Popliteal    DP    PT     Extremities: without ischemic changes, without Gangrene , without cellulitis; without open wounds;  Musculoskeletal: no muscle wasting or atrophy  Neurologic: A&O X 3;  No focal weakness or paresthesias are detected Psychiatric:  The pt has Normal affect.   Non-Invasive Vascular Imaging:   Who excised the superficial veins bilaterally    ASSESSMENT/PLAN:  EUNA KENDER is a 67 y.o. female who presents with chronic kidney disease stage 5  Based on vein mapping and examination, Myrene has usable superficial veins bilaterally.  Being that she is right-handed, we discussed left-sided brachiocephalic fistula creation. I had an extensive discussion with this patient in regards to the nature of access surgery, including risk, benefits, and alternatives.   The patient is aware that the risks of access surgery include but are not limited to: bleeding, infection, steal syndrome, nerve damage, ischemic monomelic neuropathy, failure of access to mature, complications related to venous hypertension, and possible need for additional access procedures in the future.  I discussed with the patient the nature of the staged access procedure, specifically the need for a second operation to transpose the first stage fistula if it matures adequately.   The patient has agreed to proceed with the above procedure which will be scheduled at her convenience.  Victorino Sparrow, MD [facility] [phone]

## 2022-08-30 NOTE — Telephone Encounter (Signed)
Attempted to reach patient to schedule surgery, no answer. Left VM for patient to return call.

## 2022-09-02 ENCOUNTER — Other Ambulatory Visit: Payer: Self-pay

## 2022-09-02 DIAGNOSIS — N185 Chronic kidney disease, stage 5: Secondary | ICD-10-CM

## 2022-09-05 ENCOUNTER — Other Ambulatory Visit: Payer: Self-pay | Admitting: Internal Medicine

## 2022-09-12 DIAGNOSIS — I340 Nonrheumatic mitral (valve) insufficiency: Secondary | ICD-10-CM | POA: Diagnosis not present

## 2022-09-12 DIAGNOSIS — Z992 Dependence on renal dialysis: Secondary | ICD-10-CM | POA: Diagnosis not present

## 2022-09-12 DIAGNOSIS — E1122 Type 2 diabetes mellitus with diabetic chronic kidney disease: Secondary | ICD-10-CM | POA: Diagnosis not present

## 2022-09-12 DIAGNOSIS — N186 End stage renal disease: Secondary | ICD-10-CM | POA: Diagnosis not present

## 2022-09-12 DIAGNOSIS — I3139 Other pericardial effusion (noninflammatory): Secondary | ICD-10-CM | POA: Diagnosis not present

## 2022-09-12 DIAGNOSIS — I517 Cardiomegaly: Secondary | ICD-10-CM | POA: Diagnosis not present

## 2022-09-18 NOTE — Telephone Encounter (Signed)
Patient scheduled for surgery on 6/4. Instructions reviewed and she verbalized understanding.

## 2022-09-23 ENCOUNTER — Other Ambulatory Visit: Payer: Self-pay

## 2022-09-23 ENCOUNTER — Encounter (HOSPITAL_COMMUNITY): Payer: Self-pay | Admitting: Vascular Surgery

## 2022-09-23 DIAGNOSIS — E1122 Type 2 diabetes mellitus with diabetic chronic kidney disease: Secondary | ICD-10-CM | POA: Diagnosis not present

## 2022-09-23 DIAGNOSIS — E039 Hypothyroidism, unspecified: Secondary | ICD-10-CM | POA: Diagnosis not present

## 2022-09-23 DIAGNOSIS — N184 Chronic kidney disease, stage 4 (severe): Secondary | ICD-10-CM | POA: Diagnosis not present

## 2022-09-23 DIAGNOSIS — D631 Anemia in chronic kidney disease: Secondary | ICD-10-CM | POA: Diagnosis not present

## 2022-09-23 DIAGNOSIS — I129 Hypertensive chronic kidney disease with stage 1 through stage 4 chronic kidney disease, or unspecified chronic kidney disease: Secondary | ICD-10-CM | POA: Diagnosis not present

## 2022-09-23 DIAGNOSIS — N179 Acute kidney failure, unspecified: Secondary | ICD-10-CM | POA: Diagnosis not present

## 2022-09-23 DIAGNOSIS — N189 Chronic kidney disease, unspecified: Secondary | ICD-10-CM | POA: Diagnosis not present

## 2022-09-23 NOTE — Telephone Encounter (Signed)
Allison Patel has appointment scheduled for 10/04/2022

## 2022-09-23 NOTE — Progress Notes (Signed)
Mrs Barnes denies chest pain or shortness of breath. Patient denies having any s/s of Covid in her household, also denies any known exposure to Covid.  methicillin-resistant Staph aureus Molinelli denies  any s/s of upper or lower respiratory in the past 8 weeks.  Mrs Guffy's PCP is Dr. Joanna Hews, endocrinologist is Dr. Talmage Coin, Nephrologist is Dr. Annett Gula.  Mrs. Wynetta Fines has type II diabetes, patient wears a Dexacom 6 continous glucose reader. Patient reports that glucose # run 119-160, sometimes 100 fasting. I instructed Mrs Ayoub to take 1/2 of Lantus at hs tonight = 5 units. I instructed patient to check CBG after awaking and every 2 hours until arrival  to the hospital.  I Instructed patient if CBG is less than 70 to take 4 Glucose Tablets or 1 tube of Glucose Gel or 1/2 cup of a clear juice. Recheck CBG in 15 minutes if CBG is not over 70 call, pre- op desk at [phone] for further instructions.  If CBG is greater than 220 take 1/2 dose of Humolog sliding scale.

## 2022-09-24 ENCOUNTER — Other Ambulatory Visit: Payer: Self-pay

## 2022-09-24 ENCOUNTER — Ambulatory Visit (HOSPITAL_COMMUNITY)
Admission: RE | Admit: 2022-09-24 | Discharge: 2022-09-24 | Disposition: A | Discharge status: Home / Self Care | Payer: Medicare HMO | Attending: Vascular Surgery | Admitting: Vascular Surgery

## 2022-09-24 ENCOUNTER — Ambulatory Visit (HOSPITAL_COMMUNITY): Payer: Medicare HMO | Admitting: Certified Registered"

## 2022-09-24 ENCOUNTER — Encounter (HOSPITAL_COMMUNITY): Payer: Self-pay | Admitting: Vascular Surgery

## 2022-09-24 ENCOUNTER — Ambulatory Visit (HOSPITAL_BASED_OUTPATIENT_CLINIC_OR_DEPARTMENT_OTHER): Payer: Medicare HMO | Admitting: Certified Registered"

## 2022-09-24 ENCOUNTER — Encounter (HOSPITAL_COMMUNITY)
Admission: RE | Disposition: A | Discharge status: Home / Self Care | Payer: Self-pay | Source: Home / Self Care | Attending: Vascular Surgery

## 2022-09-24 ENCOUNTER — Other Ambulatory Visit (HOSPITAL_COMMUNITY): Payer: Self-pay

## 2022-09-24 DIAGNOSIS — D759 Disease of blood and blood-forming organs, unspecified: Secondary | ICD-10-CM | POA: Diagnosis not present

## 2022-09-24 DIAGNOSIS — D631 Anemia in chronic kidney disease: Secondary | ICD-10-CM

## 2022-09-24 DIAGNOSIS — Z794 Long term (current) use of insulin: Secondary | ICD-10-CM | POA: Diagnosis not present

## 2022-09-24 DIAGNOSIS — E1122 Type 2 diabetes mellitus with diabetic chronic kidney disease: Secondary | ICD-10-CM | POA: Diagnosis not present

## 2022-09-24 DIAGNOSIS — I120 Hypertensive chronic kidney disease with stage 5 chronic kidney disease or end stage renal disease: Secondary | ICD-10-CM

## 2022-09-24 DIAGNOSIS — D649 Anemia, unspecified: Secondary | ICD-10-CM | POA: Insufficient documentation

## 2022-09-24 DIAGNOSIS — N185 Chronic kidney disease, stage 5: Secondary | ICD-10-CM

## 2022-09-24 HISTORY — DX: Anemia, unspecified: D64.9

## 2022-09-24 HISTORY — PX: AV FISTULA PLACEMENT: SHX1204

## 2022-09-24 LAB — IRON,TIBC AND FERRITIN PANEL
%SAT: 16
Ferritin: 579
Iron: 30
TIBC: 193
UIBC: 163

## 2022-09-24 LAB — COMPREHENSIVE METABOLIC PANEL
Albumin: 3.8 (ref 3.5–5.0)
Calcium: 9 (ref 8.7–10.7)
eGFR: 6

## 2022-09-24 LAB — POCT I-STAT, CHEM 8
BUN: 86 mg/dL — ABNORMAL HIGH (ref 8–23)
Calcium, Ion: 0.97 mmol/L — ABNORMAL LOW (ref 1.15–1.40)
Chloride: 105 mmol/L (ref 98–111)
Creatinine, Ser: 7.7 mg/dL — ABNORMAL HIGH (ref 0.44–1.00)
Glucose, Bld: 152 mg/dL — ABNORMAL HIGH (ref 70–99)
HCT: 27 % — ABNORMAL LOW (ref 36.0–46.0)
Hemoglobin: 9.2 g/dL — ABNORMAL LOW (ref 12.0–15.0)
Potassium: 4 mmol/L (ref 3.5–5.1)
Sodium: 135 mmol/L (ref 135–145)
TCO2: 18 mmol/L — ABNORMAL LOW (ref 22–32)

## 2022-09-24 LAB — GLUCOSE, CAPILLARY
Glucose-Capillary: 142 mg/dL — ABNORMAL HIGH (ref 70–99)
Glucose-Capillary: 140 mg/dL — ABNORMAL HIGH (ref 70–99)

## 2022-09-24 LAB — BASIC METABOLIC PANEL
BUN: 78 — AB (ref 4–21)
CO2: 21 (ref 13–22)
Chloride: 101 (ref 99–108)
Creatinine: 6.9 — AB (ref 0.5–1.1)
Glucose: 146
Potassium: 4 mEq/L (ref 3.5–5.1)
Sodium: 139 (ref 137–147)

## 2022-09-24 LAB — CBC AND DIFFERENTIAL: Hemoglobin: 9.2 — AB (ref 12.0–16.0)

## 2022-09-24 SURGERY — ARTERIOVENOUS (AV) FISTULA CREATION
Anesthesia: Monitor Anesthesia Care | Site: Arm Upper | Laterality: Left

## 2022-09-24 MED ORDER — MIDAZOLAM HCL 2 MG/2ML IJ SOLN
INTRAMUSCULAR | Status: DC | PRN
  Administered 2022-09-24: 2 mg via INTRAVENOUS

## 2022-09-24 MED ORDER — SODIUM CHLORIDE 0.9 % IV SOLN: INTRAVENOUS | Status: DC

## 2022-09-24 MED ORDER — GLYCOPYRROLATE 0.2 MG/ML IJ SOLN
INTRAMUSCULAR | Status: DC | PRN
  Administered 2022-09-24: .1 mg via INTRAVENOUS

## 2022-09-24 MED ORDER — INSULIN ASPART 100 UNIT/ML IJ SOLN: 0.0000 [IU] | INTRAMUSCULAR | Status: DC | PRN

## 2022-09-24 MED ORDER — CHLORHEXIDINE GLUCONATE 0.12 % MT SOLN
OROMUCOSAL | Status: AC
  Administered 2022-09-24: 15 mL via OROMUCOSAL
  Filled 2022-09-24: qty 15

## 2022-09-24 MED ORDER — PROPOFOL 500 MG/50ML IV EMUL
INTRAVENOUS | Status: DC | PRN
  Administered 2022-09-24 (×2): 20 mg via INTRAVENOUS
  Administered 2022-09-24: 85 ug/kg/min via INTRAVENOUS

## 2022-09-24 MED ORDER — HEPARIN 6000 UNIT IRRIGATION SOLUTION
Status: AC
  Filled 2022-09-24: qty 500

## 2022-09-24 MED ORDER — PROPOFOL 10 MG/ML IV BOLUS
INTRAVENOUS | Status: AC
  Filled 2022-09-24: qty 20

## 2022-09-24 MED ORDER — ORAL CARE MOUTH RINSE: 15.0000 mL | Freq: Once | OROMUCOSAL | Status: AC

## 2022-09-24 MED ORDER — PHENYLEPHRINE 80 MCG/ML (10ML) SYRINGE FOR IV PUSH (FOR BLOOD PRESSURE SUPPORT)
PREFILLED_SYRINGE | INTRAVENOUS | Status: DC | PRN
  Administered 2022-09-24: 160 ug via INTRAVENOUS

## 2022-09-24 MED ORDER — OXYCODONE-ACETAMINOPHEN 5-325 MG PO TABS: 1.0000 | ORAL_TABLET | Freq: Four times a day (QID) | ORAL | 0 refills | Status: DC | PRN

## 2022-09-24 MED ORDER — ONDANSETRON HCL 4 MG/2ML IJ SOLN
INTRAMUSCULAR | Status: DC | PRN
  Administered 2022-09-24: 4 mg via INTRAVENOUS

## 2022-09-24 MED ORDER — HEMOSTATIC AGENTS (NO CHARGE) OPTIME
TOPICAL | Status: DC | PRN
  Administered 2022-09-24: 1 via TOPICAL

## 2022-09-24 MED ORDER — CHLORHEXIDINE GLUCONATE 0.12 % MT SOLN: 15.0000 mL | Freq: Once | OROMUCOSAL | Status: AC

## 2022-09-24 MED ORDER — CEFAZOLIN SODIUM-DEXTROSE 2-4 GM/100ML-% IV SOLN
2.0000 g | INTRAVENOUS | Status: AC
  Administered 2022-09-24: 2 g via INTRAVENOUS

## 2022-09-24 MED ORDER — FENTANYL CITRATE (PF) 250 MCG/5ML IJ SOLN
INTRAMUSCULAR | Status: AC
  Filled 2022-09-24: qty 5

## 2022-09-24 MED ORDER — LIDOCAINE-EPINEPHRINE (PF) 1 %-1:200000 IJ SOLN
INTRAMUSCULAR | Status: AC
  Filled 2022-09-24: qty 30

## 2022-09-24 MED ORDER — SODIUM CHLORIDE 0.9 % IV SOLN: 510.0000 mg | INTRAVENOUS | Status: DC

## 2022-09-24 MED ORDER — HEPARIN SODIUM (PORCINE) 1000 UNIT/ML IJ SOLN
INTRAMUSCULAR | Status: DC | PRN
  Administered 2022-09-24: 2000 [IU] via INTRAVENOUS

## 2022-09-24 MED ORDER — HEPARIN 6000 UNIT IRRIGATION SOLUTION
Status: DC | PRN
  Administered 2022-09-24: 1

## 2022-09-24 MED ORDER — CHLORHEXIDINE GLUCONATE 4 % EX SOLN: 60.0000 mL | Freq: Once | CUTANEOUS | Status: DC

## 2022-09-24 MED ORDER — 0.9 % SODIUM CHLORIDE (POUR BTL) OPTIME
TOPICAL | Status: DC | PRN
  Administered 2022-09-24: 1000 mL

## 2022-09-24 MED ORDER — CEFAZOLIN SODIUM-DEXTROSE 2-4 GM/100ML-% IV SOLN
INTRAVENOUS | Status: AC
  Filled 2022-09-24: qty 100

## 2022-09-24 MED ORDER — MIDAZOLAM HCL 2 MG/2ML IJ SOLN
INTRAMUSCULAR | Status: AC
  Filled 2022-09-24: qty 2

## 2022-09-24 MED ORDER — FENTANYL CITRATE (PF) 250 MCG/5ML IJ SOLN
INTRAMUSCULAR | Status: DC | PRN
  Administered 2022-09-24: 50 ug via INTRAVENOUS

## 2022-09-24 SURGICAL SUPPLY — 37 items
ADH SKN CLS APL DERMABOND .7 (GAUZE/BANDAGES/DRESSINGS) ×1
ARMBAND PINK RESTRICT EXTREMIT (MISCELLANEOUS) ×1 IMPLANT
BAG COUNTER SPONGE SURGICOUNT (BAG) ×1 IMPLANT
BAG SPNG CNTER NS LX DISP (BAG) ×1
BLADE CLIPPER SURG (BLADE) ×1 IMPLANT
BNDG ELASTIC 4X5.8 VLCR STR LF (GAUZE/BANDAGES/DRESSINGS) ×1 IMPLANT
CANISTER SUCT 3000ML PPV (MISCELLANEOUS) ×1 IMPLANT
CLIP TI MEDIUM 6 (CLIP) ×2 IMPLANT
CLIP TI WIDE RED SMALL 6 (CLIP) ×1 IMPLANT
COVER PROBE W GEL 5X96 (DRAPES) ×1 IMPLANT
DERMABOND ADVANCED .7 DNX12 (GAUZE/BANDAGES/DRESSINGS) ×1 IMPLANT
ELECT REM PT RETURN 9FT ADLT (ELECTROSURGICAL) ×1
ELECTRODE REM PT RTRN 9FT ADLT (ELECTROSURGICAL) ×1 IMPLANT
GLOVE BIOGEL PI IND STRL 8 (GLOVE) ×1 IMPLANT
GOWN STRL REUS W/ TWL LRG LVL3 (GOWN DISPOSABLE) ×2 IMPLANT
GOWN STRL REUS W/TWL 2XL LVL3 (GOWN DISPOSABLE) ×2 IMPLANT
GOWN STRL REUS W/TWL LRG LVL3 (GOWN DISPOSABLE) ×2
HEMOSTAT SNOW SURGICEL 2X4 (HEMOSTASIS) IMPLANT
HEMOSTAT SURGICEL 2X14 (HEMOSTASIS) IMPLANT
KIT BASIN OR (CUSTOM PROCEDURE TRAY) ×1 IMPLANT
KIT TURNOVER KIT B (KITS) ×1 IMPLANT
NS IRRIG 1000ML POUR BTL (IV SOLUTION) ×1 IMPLANT
PACK CV ACCESS (CUSTOM PROCEDURE TRAY) ×1 IMPLANT
PAD ARMBOARD 7.5X6 YLW CONV (MISCELLANEOUS) ×2 IMPLANT
SLING ARM FOAM STRAP LRG (SOFTGOODS) IMPLANT
SLING ARM FOAM STRAP MED (SOFTGOODS) IMPLANT
SPIKE FLUID TRANSFER (MISCELLANEOUS) ×1 IMPLANT
SUT MNCRL AB 4-0 PS2 18 (SUTURE) ×1 IMPLANT
SUT PROLENE 6 0 BV (SUTURE) ×1 IMPLANT
SUT PROLENE 7 0 BV 1 (SUTURE) IMPLANT
SUT SILK 2 0 SH (SUTURE) IMPLANT
SUT SILK 3 0 SH CR/8 (SUTURE) ×1 IMPLANT
SUT VIC AB 3-0 SH 27 (SUTURE) ×1
SUT VIC AB 3-0 SH 27X BRD (SUTURE) ×1 IMPLANT
TOWEL GREEN STERILE (TOWEL DISPOSABLE) ×1 IMPLANT
UNDERPAD 30X36 HEAVY ABSORB (UNDERPADS AND DIAPERS) ×1 IMPLANT
WATER STERILE IRR 1000ML POUR (IV SOLUTION) ×1 IMPLANT

## 2022-09-24 NOTE — Discharge Instructions (Signed)
[facility]  Discharge Instructions  AV Fistula or Graft Surgery for Dialysis Access  Please refer to the following instructions for your post-procedure care. Your surgeon or physician assistant will discuss any changes with you.  Activity  You may drive the day following your surgery, if you are comfortable and no longer taking prescription pain medication. Resume full activity as the soreness in your incision resolves.  Bathing/Showering  You may shower after you go home. Keep your incision dry for 48 hours. Do not soak in a bathtub, hot tub, or swim until the incision heals completely. You may not shower if you have a hemodialysis catheter.  Incision Care  Clean your incision with mild soap and water after 48 hours. Pat the area dry with a clean towel. You do not need a bandage unless otherwise instructed. Do not apply any ointments or creams to your incision. You may have skin glue on your incision. Do not peel it off. It will come off on its own in about one week. Your arm may swell a bit after surgery. To reduce swelling use pillows to elevate your arm so it is above your heart. Your doctor will tell you if you need to lightly wrap your arm with an ACE bandage.  Diet  Resume your normal diet. There are not special food restrictions following this procedure. In order to heal from your surgery, it is CRITICAL to get adequate nutrition. Your body requires vitamins, minerals, and protein. Vegetables are the best source of vitamins and minerals. Vegetables also provide the perfect balance of protein. Processed food has little nutritional value, so try to avoid this.  Medications  Resume taking all of your medications. If your incision is causing pain, you may take over-the counter pain relievers such as acetaminophen (Tylenol). If you were prescribed a stronger pain medication, please be aware these medications can cause nausea and constipation. Prevent  nausea by taking the medication with a snack or meal. Avoid constipation by drinking plenty of fluids and eating foods with high amount of fiber, such as fruits, vegetables, and grains.  Do not take Tylenol if you are taking prescription pain medications.  Follow up Your surgeon may want to see you in the office following your access surgery. If so, this will be arranged at the time of your surgery.  Please call us immediately for any of the following conditions:  Increased pain, redness, drainage (pus) from your incision site Fever of 101 degrees or higher Severe or worsening pain at your incision site Hand pain or numbness.  Reduce your risk of vascular disease:  Stop smoking. If you would like help, call [facility] at 1-800-QUIT-NOW ((308)741-8174) or [facility] at 719-179-1073  Manage your cholesterol Maintain a desired weight Control your diabetes Keep your blood pressure down  Dialysis  It will take several weeks to several months for your new dialysis access to be ready for use. Your surgeon will determine when it is okay to use it. Your nephrologist will continue to direct your dialysis. You can continue to use your Permcath until your new access is ready for use.   09/24/2022 Allison Patel [id number] 01/29/1956  Surgeon(s): Victorino Sparrow, MD  Procedure(s): LEFT ARM ARTERIOVENOUS (AV) BRACHIOCEPHALIC FISTULA CREATION   May stick graft immediately   May stick graft on designated area only:   X Do not stick left AV fistula for 12 weeks    If you have any questions, please call the  office at [phone].

## 2022-09-24 NOTE — H&P (Signed)
Office Note   Patient seen and examined in preop holding.  No complaints. No changes to medication history or physical exam since last seen in clinic. After discussing the risks and benefits of left arm fistula creation, Allison Patel elected to proceed.   Victorino Sparrow MD   CC:  [id number] Requesting Provider:  Victorino Sparrow, MD  HPI: Allison Patel is a Right handed 67 y.o. (Oct 31, 1955) female with kidney disease who presents at the request of Victorino Sparrow, MD for permanent HD access. The patient has had no prior access procedures.   On exam, she was doing well accompanied by her husband.  Native [place], she has lived in the [place] area her entire life.  Her children went to [place].  Her husband works at [employer] in Editor, commissioning.  No surgical history in bilateral upper extremities, no tunneled dialysis catheters  Past Medical History:  Diagnosis Date   Anemia    Chronic kidney disease    Diabetes mellitus without complication (HCC)    [place]ell   Hyperlipidemia    Hypertension    Hypothyroidism     Past Surgical History:  Procedure Laterality Date   ABDOMINAL HYSTERECTOMY     CESAREAN SECTION     2   CHOLECYSTECTOMY     IR FLUORO GUIDE CV LINE RIGHT  10/21/2018   IR US GUIDE VASC ACCESS RIGHT  10/21/2018    Social History   Socioeconomic History   Marital status: Married    Spouse name: Not on file   Number of children: Not on file   Years of education: Not on file   Highest education level: Not on file  Occupational History   Not on file  Tobacco Use   Smoking status: Never   Smokeless tobacco: Never  Vaping Use   Vaping Use: Never used  Substance and Sexual Activity   Alcohol use: No   Drug use: No   Sexual activity: Not on file  Other Topics Concern   Not on file  Social History Narrative   Social history: She is a Careers information officer at [employer].  Husband is an Designer, fashion/clothing.  She completed 2 years of college.   Does not smoke or consume alcohol.  She resides in [place] and has 2 adult daughters.       Family history: Mother died at age 28 with congestive heart failure.  Father died at age 55 with complications of pneumonia and heart failure.  Another brother in good health.  No sisters.   Social Determinants of Health   Financial Resource Strain: Not on file  Food Insecurity: Not on file  Transportation Needs: Not on file  Physical Activity: Not on file  Stress: Not on file  Social Connections: Not on file  Intimate Partner Violence: Not on file   Family History  Problem Relation Age of Onset   Diabetes Mother    Hypertension Mother    Congestive Heart Failure Mother    Hypertension Father    Pneumonia Father    Pancreatic cancer Brother     Current Facility-Administered Medications  Medication Dose Route Frequency Provider Last Rate Last Admin   0.9 %  sodium chloride infusion   Intravenous Continuous Victorino Sparrow, MD       0.9 %  sodium chloride infusion   Intravenous Continuous Val Eagle, MD       ceFAZolin (ANCEF) 2-4 GM/100ML-% IVPB  ceFAZolin (ANCEF) IVPB 2g/100 mL premix  2 g Intravenous 30 min Pre-Op Victorino Sparrow, MD       chlorhexidine (HIBICLENS) 4 % liquid 4 Application  60 mL Topical Once Victorino Sparrow, MD       And   [START ON 09/25/2022] chlorhexidine (HIBICLENS) 4 % liquid 4 Application  60 mL Topical Once Victorino Sparrow, MD       ferumoxytol [facility]) 510 mg in sodium chloride 0.9 % 100 mL IVPB  510 mg Intravenous Weekly Annie Sable, MD       insulin aspart (novoLOG) injection 0-14 Units  0-14 Units Subcutaneous Q2H PRN Val Eagle, MD        Allergies  Allergen Reactions   Macrobid [Nitrofurantoin Macrocrystal] Rash     REVIEW OF SYSTEMS:  [X]  denotes positive finding, [ ]  denotes negative finding Cardiac  Comments:  Chest pain or chest pressure:    Shortness of breath upon exertion:    Short of breath when  lying flat:    Irregular heart rhythm:        Vascular    Pain in calf, thigh, or hip brought on by ambulation:    Pain in feet at night that wakes you up from your sleep:     Blood clot in your veins:    Leg swelling:         Pulmonary    Oxygen at home:    Productive cough:     Wheezing:         Neurologic    Sudden weakness in arms or legs:     Sudden numbness in arms or legs:     Sudden onset of difficulty speaking or slurred speech:    Temporary loss of vision in one eye:     Problems with dizziness:         Gastrointestinal    Blood in stool:     Vomited blood:         Genitourinary    Burning when urinating:     Blood in urine:        Psychiatric    Major depression:         Hematologic    Bleeding problems:    Problems with blood clotting too easily:        Skin    Rashes or ulcers:        Constitutional    Fever or chills:      PHYSICAL EXAMINATION:  Vitals:   09/24/22 0557  BP: (!) 184/81  Pulse: 77  Resp: 17  Temp: 98.5 F (36.9 C)  TempSrc: Oral  SpO2: 96%  Weight: 74.4 kg  Height: 5' (1.524 m)    General:  WDWN in NAD; vital signs documented above Gait: Not observed HENT: WNL, normocephalic Pulmonary: normal non-labored breathing , without Rales, rhonchi,  wheezing Cardiac: regular HR,  Abdomen: soft, NT, no masses Skin: without rashes Vascular Exam/Pulses:  Right Left  Radial 2+ (normal) 2+ (normal)  Ulnar    Femoral    Popliteal    DP    PT     Extremities: without ischemic changes, without Gangrene , without cellulitis; without open wounds;  Musculoskeletal: no muscle wasting or atrophy  Neurologic: A&O X 3;  No focal weakness or paresthesias are detected Psychiatric:  The pt has Normal affect.   Non-Invasive Vascular Imaging:   Who excised the superficial veins bilaterally    ASSESSMENT/PLAN:  Allison Patel is a 67  y.o. female who presents with chronic kidney disease stage 5  Based on vein mapping and  examination, Myrene has usable superficial veins bilaterally.  Being that she is right-handed, we discussed left-sided brachiocephalic fistula creation. I had an extensive discussion with this patient in regards to the nature of access surgery, including risk, benefits, and alternatives.   The patient is aware that the risks of access surgery include but are not limited to: bleeding, infection, steal syndrome, nerve damage, ischemic monomelic neuropathy, failure of access to mature, complications related to venous hypertension, and possible need for additional access procedures in the future.  I discussed with the patient the nature of the staged access procedure, specifically the need for a second operation to transpose the first stage fistula if it matures adequately.   The patient has agreed to proceed with the above procedure which will be scheduled at her convenience.  Victorino Sparrow, MD [facility] [phone]

## 2022-09-24 NOTE — Anesthesia Preprocedure Evaluation (Signed)
Anesthesia Evaluation  Patient identified by MRN, date of birth, ID band Patient awake    Reviewed: Allergy & Precautions, NPO status , Patient's Chart, lab work & pertinent test results  History of Anesthesia Complications Negative for: history of anesthetic complications  Airway Mallampati: IV  TM Distance: <3 FB Neck ROM: Full    Dental  (+) Teeth Intact, Dental Advisory Given   Pulmonary neg pulmonary ROS   breath sounds clear to auscultation       Cardiovascular hypertension, Pt. on home beta blockers and Pt. on medications (-) angina (-) Past MI and (-) CHF (-) dysrhythmias  Rhythm:Regular  1. The left ventricle has hyperdynamic systolic function, with an  ejection fraction of >65%. The cavity size was normal. There is severe  asymmetric hypertrophy of the septum (1.7 cm septum, 1.1 cm inferolateral  wall). LV outflow gradient with peak 37  mmHg. Left ventricular diastolic Doppler parameters are consistent with  pseudonormalization. No evidence of left ventricular regional wall motion  abnormalities.   2. The right ventricle has normal systolic function. The cavity was  normal. There is no increase in right ventricular wall thickness.   3. There is mild mitral annular calcification present. No evidence of  mitral valve stenosis. Systolic anterior motion of the anterior mitral  valve leaflet noted with only trivial mitral regurgitation.   4. The aortic valve is tricuspid. Mild calcification of the aortic valve.  No stenosis of the aortic valve.   5. The aortic root is normal in size and structure.   6. Left atrial size was mildly dilated.   7. Normal IVC size with PA systolic pressure 42 mmHg.   8. This study is suggestive of hypertrophic cardiomyopathy.   9. If there is clinical concern for endocarditis, would need TEE.     Neuro/Psych negative neurological ROS  negative psych ROS   GI/Hepatic negative GI ROS, Neg  liver ROS,,,  Endo/Other  diabetes, Type 2, Insulin DependentHypothyroidism  Lab Results      Component                Value               Date                      HGBA1C                   8.1                 05/20/2022             Renal/GU CRFRenal disease     Musculoskeletal   Abdominal   Peds  Hematology  (+) Blood dyscrasia, anemia Lab Results      Component                Value               Date                      WBC                      9.0                 06/18/2021                HGB  10.2 (A)            05/20/2022                HCT                      36.3                06/18/2021                MCV                      84.4                06/18/2021                PLT                      269                 06/18/2021              Anesthesia Other Findings   Reproductive/Obstetrics                             Anesthesia Physical Anesthesia Plan  ASA: 3  Anesthesia Plan: MAC and Regional   Post-op Pain Management: Regional block*   Induction: Intravenous  PONV Risk Score and Plan: 2 and Propofol infusion and Treatment may vary due to age or medical condition  Airway Management Planned: Nasal Cannula, Natural Airway and Simple Face Mask  Additional Equipment: None  Intra-op Plan:   Post-operative Plan:   Informed Consent: I have reviewed the patients History and Physical, chart, labs and discussed the procedure including the risks, benefits and alternatives for the proposed anesthesia with the patient or authorized representative who has indicated his/her understanding and acceptance.     Dental advisory given  Plan Discussed with: CRNA  Anesthesia Plan Comments:        Anesthesia Quick Evaluation

## 2022-09-24 NOTE — Transfer of Care (Signed)
Immediate Anesthesia Transfer of Care Note  Patient: Allison Patel  Procedure(s) Performed: LEFT ARM ARTERIOVENOUS (AV) BRACHIOCEPHALIC FISTULA CREATION (Left: Arm Upper)  Patient Location: PACU  Anesthesia Type:MAC and Regional  Level of Consciousness: drowsy and patient cooperative  Airway & Oxygen Therapy: Patient Spontanous Breathing and Patient connected to face mask oxygen  Post-op Assessment: Report given to RN and Post -op Vital signs reviewed and stable  Post vital signs: Reviewed and stable  Last Vitals:  Vitals Value Taken Time  BP 147/72 09/24/22 0851  Temp    Pulse 82 09/24/22 0854  Resp 19 09/24/22 0854  SpO2 100 % 09/24/22 0854  Vitals shown include unvalidated device data.  Last Pain:  Vitals:   09/24/22 0614  TempSrc:   PainSc: 0-No pain      Patients Stated Pain Goal: 0 (09/24/22 6440)  Complications: No notable events documented.

## 2022-09-24 NOTE — Op Note (Signed)
    NAME: Allison Patel    MRN: [id number] DOB: 09-10-1955    DATE OF OPERATION: 09/24/2022  PREOP DIAGNOSIS:    Chronic Kidney Disease Stage 5  POSTOP DIAGNOSIS:    Same  PROCEDURE:    Left arm brachiocephalic fistula   SURGEON: Victorino Sparrow  ASSIST: Nathanial Rancher, PA  ANESTHESIA: General   EBL: 10ml  INDICATIONS:    Allison Patel is a 67 y.o. female with chronic kidney disease in need fo long term HD access. After discussin ght erisks and benefits of brachiocephalic fistula creation, Allison Patel elected to proceed.   FINDINGS:   4mm cephalic fistula 4mm Brachial artery   TECHNIQUE:   The patient was brought to the operating room and placed in supine position. The left arm was prepped and draped in standard fashion. IV antibiotics were administered. A timeout was performed.   The case began with ultrasound insonation of the brachial artery and cephalic vein, which demonstrated sufficient size at the antecubital fossa for arteriovenous fistula.   A transverse incision was made below the elbow creese in the antecubital fossa. The  cephalic vein was isolated for 3 cm in length. Next the aponeurosis was partially released and the brachial artery secured with a vessel loop. The patient was heparinized. The cephalic vein was transected and ligated distally with a 2-0 silk stick-tie. The vein was dilated with coronary dilators and flushed with heparin saline. Vascular clamps were placed proximally and distally on the brachial artery and a 5 mm arteriotomy was created on the brachial artery. This was flushed with heparin saline.  An anastomosis was created in end to side fashion on the brachial artery using running 6-0 Prolene suture.  Prior to completing the anastomosis, the vessels were flushed and the suture line was tied down. There was an excellent thrill in the cephalic vein from the anastomosis to the mid upper bicipital region. The patient had a multiphasic radial and ulnar  signal. He had an excellent doppler signal in the fistula. The incision was irrigated and hemostasis achieved with cautery and suture. The deeper tissue was closed with 3-0 Vicryl and the skin closed with 4-0 Monocryl.  Dermabond was applied to the incisions. The patient was transferred to PACU in stable condition.  Given the complexity of the case,  the assistant was necessary in order to expedient the procedure and safely perform the technical aspects of the operation.  The assistant provided traction and countertraction to assist with exposure of the artery and vein.  They also assisted with suture ligation of multiple venous branches.  They played a critical role in the anastomosis. These skills, especially following the Prolene suture for the anastomosis, could not have been adequately performed by a scrub tech assistant.    Ladonna Snide, MD [facility] DATE OF DICTATION:   09/24/2022

## 2022-09-25 ENCOUNTER — Encounter (HOSPITAL_COMMUNITY): Payer: Self-pay | Admitting: Vascular Surgery

## 2022-09-26 ENCOUNTER — Encounter (HOSPITAL_COMMUNITY): Payer: Self-pay | Admitting: Vascular Surgery

## 2022-09-26 MED ORDER — LIDOCAINE-EPINEPHRINE (PF) 1.5 %-1:200000 IJ SOLN
INTRAMUSCULAR | Status: DC | PRN
  Administered 2022-09-24: 15 mL via PERINEURAL

## 2022-09-26 MED ORDER — MEPIVACAINE HCL (PF) 2 % IJ SOLN
INTRAMUSCULAR | Status: DC | PRN
  Administered 2022-09-24: 10 mL

## 2022-09-26 NOTE — Anesthesia Procedure Notes (Addendum)
Anesthesia Regional Block: Adductor canal block   Pre-Anesthetic Checklist: , timeout performed,  Correct Patient, Correct Site, Correct Laterality,  Correct Procedure, Correct Position, site marked,  Risks and benefits discussed,  Surgical consent,  Pre-op evaluation,  At surgeon's request and post-op pain management  Laterality: Left and Upper  Prep: chloraprep       Needles:  Injection technique: Single-shot      Needle Length: 9cm  Needle Gauge: 22     Additional Needles: Arrow StimuQuik ECHO Echogenic Stimulating PNB Needle  Procedures:,,,, ultrasound used (permanent image in chart),,    Narrative:  Start time: 09/24/2022 7:15 AM End time: 09/24/2022 7:20 AM Injection made incrementally with aspirations every 5 mL.  Performed by: Personally  Anesthesiologist: Val Eagle, MD

## 2022-09-26 NOTE — Anesthesia Postprocedure Evaluation (Signed)
Anesthesia Post Note  Patient: Allison Patel  Procedure(s) Performed: LEFT ARM ARTERIOVENOUS (AV) BRACHIOCEPHALIC FISTULA CREATION (Left: Arm Upper)     Patient location during evaluation: PACU Anesthesia Type: Regional and MAC Level of consciousness: awake and alert Pain management: pain level controlled Vital Signs Assessment: post-procedure vital signs reviewed and stable Respiratory status: spontaneous breathing, nonlabored ventilation and respiratory function stable Cardiovascular status: stable and blood pressure returned to baseline Postop Assessment: no apparent nausea or vomiting Anesthetic complications: no   No notable events documented.  Last Vitals:  Vitals:   09/24/22 0851 09/24/22 0906  BP: (!) 147/72 (!) 166/75  Pulse: 83 81  Resp: 20 20  Temp: 36.7 C 36.7 C  SpO2: 97% 94%    Last Pain:  Vitals:   09/24/22 0851  TempSrc:   PainSc: 0-No pain                 Izzah Pasqua

## 2022-09-27 ENCOUNTER — Encounter: Payer: Self-pay | Admitting: Internal Medicine

## 2022-09-27 NOTE — Progress Notes (Signed)
Annual Wellness Visit    Patient Care Team: Reeta Kuk, [name], MD as PCP - General (Internal Medicine) Blima Ledger, OD (Optometry)  Visit Date: 10/04/22   Chief Complaint  Patient presents with   Medicare Wellness   Annual Exam    Subjective:   Patient: Allison Patel, Female    DOB: [birth year], 67 y.o.   MRN: [id number]  TRECHELLE BEACHER is a 67 y.o. Female who presents today for her Annual Wellness Visit.  History of anemia. RBC low at 2.9. Hemoglobin low at 8. HCT low at 25.6. MCHC low at 31.3.  History of chronic kidney failure. Status post left arm arteriovenous brachiocephalic fistula creation 09/24/22. Taking calcitriol 0.25 mcg daily. Sees Dr. Sharl Ma for management of DM and Dr. Kathrene Bongo for CKD Stage 4. Creatinine elevated at 6.98. Has Dexcom device. Status post left arm arteriovenous brachiocephalic fistula creation 09/24/22.  History of Type 2 diabetes mellitus treated with insulin 34 units daily at 10 pm, Humalog 10 units twice daily, Farxiga 10 mg daily. HGBA1c at 7.9% on 10/03/22, down from 8.1% on 05/20/22.  History of hyperlipidemia treated with rosuvastatin 40 mg daily. HDL low at 40.   History of hypertension treated with amlodipine 10 mg daily, metoprolol tartrate 12.5 mg twice daily, clonidine 0.2 mg twice daily. Blood pressure normal today at 128/68.  History of hypothyroidism treated with levothyroxine 75 mcg daily. Compliant with this as directed. TSH elevated at 4.89.   History of lower extremity swelling treated with furosemide 40 mg daily.  History of Vitamin D deficiency treated with Vitamin D 50,000 units weekly.  History of chronic pain treated with Percocet 5-325 mg every 6 hours as needed.   Fracture of left tibial plateau Summer 2022.   She was admitted in July 2020 with septicemia and had to undergo hemodialysis.  Blood culture was positive for Enterobacter cloacae and responded well to antibiotics but kidney function deteriorated.  She had  confusion, hyponatremia, acidosis, hyperglycemia, acute renal failure and septicemia.  She had hyperphosphatemia .  Continues to be followed by [facility].  They also follow her for hypertension.  History of C-section x2, cholecystectomy, bilateral salpingectomy and abdominal hysterectomy.  Longstanding history obesity and obesity runs in her family.  EKG has shown bifascicular block in the past.  Seen by Dr. Blima Ledger for annual diabetic eye exam.  Old records indicates she has a history of osteopenia with stress fracture in her pelvis.  History of hypertrophic cardiomyopathy previously seen by Dr. Garnette Scheuermann.  Denies new allergies.  Glucose elevated at 139. BUN elevated at 89. GFR low at 6. Calcium low at 7.9. Albumin low at 3.4. ALT low at 4. WBC elevated at 12.6. Neutro Abs elevated at 10,584.  Last mammogram on 08/20/16.  No mammographic evidence of malignancy.   Encouraged to schedule colonoscopy.  Social history: She is a Careers information officer at [employer].  Husband is an Designer, fashion/clothing.  She completed 2 years of college.  Does not smoke or consume alcohol.  She resides in [place] and has 2 adult daughters.  Family history: Mother died at age 17 with congestive heart failure.  Father died at age 19 with complications of pneumonia and heart failure.  Brother in good health.  No sisters.  Past Medical History:  Diagnosis Date   Anemia    Chronic kidney disease    Diabetes mellitus without complication (HCC)    [place]   Hyperlipidemia  Hypertension    Hypothyroidism      Family History  Problem Relation Age of Onset   Diabetes Mother    Hypertension Mother    Congestive Heart Failure Mother    Hypertension Father    Pneumonia Father    Pancreatic cancer Brother      Social History   Social History Narrative   Social history: She is a Careers information officer at [employer].  Husband is an Dance movement psychotherapist.  She completed 2 years of college.  Does not smoke or consume alcohol.  She resides in [place] and has 2 adult daughters.       Family history: Mother died at age 79 with congestive heart failure.  Father died at age 41 with complications of pneumonia and heart failure.  Another brother in good health.  No sisters.     Review of Systems  Constitutional:  Negative for chills, fever, malaise/fatigue and weight loss.  HENT:  Negative for hearing loss, sinus pain and sore throat.   Respiratory:  Negative for cough, hemoptysis and shortness of breath.   Cardiovascular:  Negative for chest pain, palpitations, leg swelling and PND.  Gastrointestinal:  Negative for abdominal pain, constipation, diarrhea, heartburn, nausea and vomiting.  Genitourinary:  Negative for dysuria, frequency and urgency.  Musculoskeletal:  Negative for back pain, myalgias and neck pain.  Skin:  Negative for itching and rash.  Neurological:  Negative for dizziness, tingling, seizures and headaches.  Endo/Heme/Allergies:  Negative for polydipsia.  Psychiatric/Behavioral:  Negative for depression. The patient is not nervous/anxious.       Objective:   Vitals: BP 128/68   Pulse 74   Temp 97.6 F (36.4 C) (Tympanic)   Resp 16   Ht 4' 11.25" (1.505 m)   Wt 166 lb 4 oz (75.4 kg)   SpO2 99%   BMI 33.30 kg/m   Physical Exam Vitals and nursing note reviewed.  Constitutional:      General: She is not in acute distress.    Appearance: Normal appearance. She is not ill-appearing or toxic-appearing.  HENT:     Head: Normocephalic and atraumatic.     Right Ear: Hearing, tympanic membrane, ear canal and external ear normal.     Left Ear: Hearing, tympanic membrane, ear canal and external ear normal.     Mouth/Throat:     Pharynx: Oropharynx is clear.  Eyes:     Extraocular Movements: Extraocular movements intact.     Pupils: Pupils are equal, round, and reactive to light.  Neck:      Thyroid: No thyroid mass, thyromegaly or thyroid tenderness.     Vascular: No carotid bruit.  Cardiovascular:     Rate and Rhythm: Normal rate and regular rhythm. No extrasystoles are present.    Pulses:          Dorsalis pedis pulses are 1+ on the right side and 1+ on the left side.     Heart sounds: Normal heart sounds. No murmur heard.    No friction rub. No gallop.  Pulmonary:     Effort: Pulmonary effort is normal.     Breath sounds: Normal breath sounds. No decreased breath sounds, wheezing, rhonchi or rales.  Chest:     Chest wall: No mass.  Abdominal:     Palpations: Abdomen is soft. There is no hepatomegaly, splenomegaly or mass.     Tenderness: There is no abdominal tenderness.     Hernia: No hernia is present.  Musculoskeletal:  Cervical back: Normal range of motion.     Right lower leg: No edema.     Left lower leg: No edema.  Lymphadenopathy:     Cervical: No cervical adenopathy.     Upper Body:     Right upper body: No supraclavicular adenopathy.     Left upper body: No supraclavicular adenopathy.  Skin:    General: Skin is warm and dry.  Neurological:     General: No focal deficit present.     Mental Status: She is alert and oriented to person, place, and time. Mental status is at baseline.     Sensory: Sensation is intact.     Motor: Motor function is intact. No weakness.     Deep Tendon Reflexes: Reflexes are normal and symmetric.  Psychiatric:        Attention and Perception: Attention normal.        Mood and Affect: Mood normal.        Speech: Speech normal.        Behavior: Behavior normal.        Thought Content: Thought content normal.        Cognition and Memory: Cognition normal.        Judgment: Judgment normal.      Most recent functional status assessment:    10/04/2022    2:09 PM  In your present state of health, do you have any difficulty performing the following activities:  Hearing? 0  Vision? 0  Difficulty concentrating or  making decisions? 0  Walking or climbing stairs? 0  Dressing or bathing? 0  Doing errands, shopping? 0  Preparing Food and eating ? N  Using the Toilet? N  In the past six months, have you accidently leaked urine? N  Do you have problems with loss of bowel control? N  Managing your Medications? N  Managing your Finances? N  Housekeeping or managing your Housekeeping? N   Most recent fall risk assessment:    10/04/2022    2:09 PM  Fall Risk   Falls in the past year? 0  Number falls in past yr: 0  Injury with Fall? 0  Risk for fall due to : No Fall Risks  Follow up Falls evaluation completed    Most recent depression screenings:    10/04/2022    2:09 PM 11/19/2020   11:59 AM  PHQ 2/9 Scores  PHQ - 2 Score 0 0   Most recent cognitive screening:    10/04/2022    2:10 PM  6CIT Screen  What Year? 0 points  What month? 0 points  What time? 0 points  Count back from 20 0 points  Months in reverse 0 points  Repeat phrase 0 points  Total Score 0 points     Results:   Studies obtained and personally reviewed by me:  Last mammogram on 08/20/16.  No mammographic evidence of malignancy.   Labs:       Component Value Date/Time   NA 139 10/03/2022 0916   NA 139 09/24/2022 0000   K 4.7 10/03/2022 0916   CL 105 10/03/2022 0916   CO2 19 (L) 10/03/2022 0916   GLUCOSE 139 (H) 10/03/2022 0916   BUN 89 (H) 10/03/2022 0916   BUN 78 (A) 09/24/2022 0000   CREATININE 6.98 (H) 10/03/2022 0916   CALCIUM 7.9 (L) 10/03/2022 0916   PROT 6.6 10/03/2022 0916   ALBUMIN 3.8 09/24/2022 0000   AST 11 10/03/2022 0916   ALT 4 (L)  10/03/2022 0916   ALKPHOS 233 (H) 10/14/2018 0212   BILITOT 0.3 10/03/2022 0916   GFRNONAA 27 (L) 03/13/2020 0916   GFRAA 31 (L) 03/13/2020 0916     Lab Results  Component Value Date   WBC 12.6 (H) 10/03/2022   HGB 8.0 (L) 10/03/2022   HCT 25.6 (L) 10/03/2022   MCV 88.3 10/03/2022   PLT 314 10/03/2022    Lab Results  Component Value Date   CHOL 98  10/03/2022   HDL 40 (L) 10/03/2022   LDLCALC 37 10/03/2022   TRIG 129 10/03/2022   CHOLHDL 2.5 10/03/2022    Lab Results  Component Value Date   HGBA1C 7.9 (H) 10/03/2022     Lab Results  Component Value Date   TSH 4.89 (H) 10/03/2022    Assessment & Plan:   Anemia: RBC low at 2.9. Hemoglobin low at 8. HCT low at 25.6. MCHC low at 31.3.   Chronic kidney failure: Taking calcitriol 0.25 mcg daily. Sees Dr. Sharl Ma for management of DM and Dr. Kathrene Bongo for CKD Stage 4. Creatinine elevated at 6.98.  Type 2 diabetes mellitus: treated with insulin 34 units daily at 10 pm, Humalog 10 units twice daily, Farxiga 10 mg daily. HGBA1c at 7.9% on 10/03/22, down from 8.1% on 05/20/22.  Hyperlipidemia: treated with rosuvastatin 40 mg daily. HDL low at 40.   Hypertension: treated with amlodipine 10 mg daily, metoprolol tartrate 12.5 mg twice daily, clonidine 0.2 mg twice daily.  Hypothyroidism: increase levothyroxine to 100 mcg daily. TSH elevated at 4.89.   Lower extremity swelling: treated with furosemide 40 mg daily.  Vitamin D deficiency: treated with Vitamin D 50,000 units weekly.  Chronic pain: treated with Percocet 5-325 mg every 6 hours as needed.  Urinalysis showed glucose, protein. Ordered urine culture.  Pelvic exam deferred to [facility].  Last mammogram on 08/20/16.  No mammographic evidence of malignancy. Ordered repeat study.  Due for colonoscopy. Referral for gastroenterologist.  Ordered bone density scan.  Vaccine counseling: UTD on flu, tetanus vaccines. Shingles vaccine discussed.  Return in 1 year for health maintenance exam or as needed.     Annual wellness visit done today including the all of the following: Reviewed patient's Family Medical History Reviewed and updated list of patient's medical providers Assessment of cognitive impairment was done Assessed patient's functional ability Established a written schedule for health screening services Health  Risk Assessent Completed and Reviewed  Discussed health benefits of physical activity, and encouraged her to engage in regular exercise appropriate for her age and condition.        I,Alexander Ruley,acting as a Neurosurgeon for Margaree Mackintosh, MD.,have documented all relevant documentation on the behalf of Margaree Mackintosh, MD,as directed by  Margaree Mackintosh, MD while in the presence of Margaree Mackintosh, MD.   I, Margaree Mackintosh, MD, have reviewed all documentation for this visit. The documentation on 10/04/22 for the exam, diagnosis, procedures, and orders are all accurate and complete.

## 2022-10-03 ENCOUNTER — Other Ambulatory Visit: Payer: Medicare HMO

## 2022-10-03 ENCOUNTER — Other Ambulatory Visit (HOSPITAL_COMMUNITY): Payer: Self-pay | Admitting: *Deleted

## 2022-10-03 DIAGNOSIS — I129 Hypertensive chronic kidney disease with stage 1 through stage 4 chronic kidney disease, or unspecified chronic kidney disease: Secondary | ICD-10-CM | POA: Diagnosis not present

## 2022-10-03 DIAGNOSIS — E039 Hypothyroidism, unspecified: Secondary | ICD-10-CM | POA: Diagnosis not present

## 2022-10-03 DIAGNOSIS — Z0000 Encounter for general adult medical examination without abnormal findings: Secondary | ICD-10-CM

## 2022-10-03 DIAGNOSIS — N184 Chronic kidney disease, stage 4 (severe): Secondary | ICD-10-CM | POA: Diagnosis not present

## 2022-10-03 DIAGNOSIS — E1165 Type 2 diabetes mellitus with hyperglycemia: Secondary | ICD-10-CM

## 2022-10-03 DIAGNOSIS — N185 Chronic kidney disease, stage 5: Secondary | ICD-10-CM

## 2022-10-03 LAB — CBC WITH DIFFERENTIAL/PLATELET
HCT: 25.6 % — ABNORMAL LOW (ref 35.0–45.0)
MCH: 27.6 pg (ref 27.0–33.0)
MCHC: 31.3 g/dL — ABNORMAL LOW (ref 32.0–36.0)
Monocytes Relative: 6.4 %
Neutro Abs: 10584 cells/uL — ABNORMAL HIGH (ref 1500–7800)
Total Lymphocyte: 6.8 %

## 2022-10-04 ENCOUNTER — Ambulatory Visit (INDEPENDENT_AMBULATORY_CARE_PROVIDER_SITE_OTHER): Payer: Medicare HMO | Admitting: Internal Medicine

## 2022-10-04 ENCOUNTER — Encounter: Payer: Self-pay | Admitting: Internal Medicine

## 2022-10-04 ENCOUNTER — Encounter (HOSPITAL_COMMUNITY): Payer: Medicare HMO

## 2022-10-04 VITALS — BP 128/68 | HR 74 | Temp 97.6°F | Resp 16 | Ht 59.25 in | Wt 166.2 lb

## 2022-10-04 DIAGNOSIS — Z1211 Encounter for screening for malignant neoplasm of colon: Secondary | ICD-10-CM | POA: Diagnosis not present

## 2022-10-04 DIAGNOSIS — R319 Hematuria, unspecified: Secondary | ICD-10-CM

## 2022-10-04 DIAGNOSIS — Z6833 Body mass index (BMI) 33.0-33.9, adult: Secondary | ICD-10-CM

## 2022-10-04 DIAGNOSIS — E1165 Type 2 diabetes mellitus with hyperglycemia: Secondary | ICD-10-CM | POA: Diagnosis not present

## 2022-10-04 DIAGNOSIS — Z794 Long term (current) use of insulin: Secondary | ICD-10-CM

## 2022-10-04 DIAGNOSIS — Z0000 Encounter for general adult medical examination without abnormal findings: Secondary | ICD-10-CM

## 2022-10-04 DIAGNOSIS — E119 Type 2 diabetes mellitus without complications: Secondary | ICD-10-CM

## 2022-10-04 DIAGNOSIS — Z1231 Encounter for screening mammogram for malignant neoplasm of breast: Secondary | ICD-10-CM

## 2022-10-04 DIAGNOSIS — E039 Hypothyroidism, unspecified: Secondary | ICD-10-CM | POA: Diagnosis not present

## 2022-10-04 DIAGNOSIS — I10 Essential (primary) hypertension: Secondary | ICD-10-CM

## 2022-10-04 DIAGNOSIS — R7989 Other specified abnormal findings of blood chemistry: Secondary | ICD-10-CM | POA: Diagnosis not present

## 2022-10-04 DIAGNOSIS — I421 Obstructive hypertrophic cardiomyopathy: Secondary | ICD-10-CM

## 2022-10-04 DIAGNOSIS — Z780 Asymptomatic menopausal state: Secondary | ICD-10-CM

## 2022-10-04 LAB — LIPID PANEL
Cholesterol: 98 mg/dL (ref <200)
HDL: 40 mg/dL — ABNORMAL LOW (ref >=50)
LDL Cholesterol (Calc): 37 mg/dL (calc)
Non-HDL Cholesterol (Calc): 58 mg/dL (calc) (ref <130)
Total CHOL/HDL Ratio: 2.5 (calc) (ref <5.0)
Triglycerides: 129 mg/dL (ref <150)

## 2022-10-04 LAB — COMPLETE METABOLIC PANEL WITH GFR
AG Ratio: 1.1 (calc) (ref 1.0–2.5)
ALT: 4 U/L — ABNORMAL LOW (ref 6–29)
AST: 11 U/L (ref 10–35)
Albumin: 3.4 g/dL — ABNORMAL LOW (ref 3.6–5.1)
Alkaline phosphatase (APISO): 71 U/L (ref 37–153)
BUN/Creatinine Ratio: 13 (calc) (ref 6–22)
BUN: 89 mg/dL — ABNORMAL HIGH (ref 7–25)
CO2: 19 mmol/L — ABNORMAL LOW (ref 20–32)
Calcium: 7.9 mg/dL — ABNORMAL LOW (ref 8.6–10.4)
Chloride: 105 mmol/L (ref 98–110)
Creat: 6.98 mg/dL — ABNORMAL HIGH (ref 0.50–1.05)
Globulin: 3.2 g/dL (calc) (ref 1.9–3.7)
Glucose, Bld: 139 mg/dL — ABNORMAL HIGH (ref 65–99)
Potassium: 4.7 mmol/L (ref 3.5–5.3)
Sodium: 139 mmol/L (ref 135–146)
Total Bilirubin: 0.3 mg/dL (ref 0.2–1.2)
Total Protein: 6.6 g/dL (ref 6.1–8.1)
eGFR: 6 mL/min/{1.73_m2} — ABNORMAL LOW (ref >=60)

## 2022-10-04 LAB — POCT URINALYSIS DIPSTICK
Bilirubin, UA: NEGATIVE
Glucose, UA: POSITIVE — AB
Ketones, UA: NEGATIVE
Leukocytes, UA: NEGATIVE
Nitrite, UA: NEGATIVE
Protein, UA: POSITIVE — AB
Spec Grav, UA: 1.015 (ref 1.010–1.025)
Urobilinogen, UA: 0.2 E.U./dL
pH, UA: 5 (ref 5.0–8.0)

## 2022-10-04 LAB — CBC WITH DIFFERENTIAL/PLATELET
Absolute Monocytes: 806 cells/uL (ref 200–950)
Basophils Absolute: 126 cells/uL (ref 0–200)
Basophils Relative: 1 %
Eosinophils Absolute: 227 cells/uL (ref 15–500)
Eosinophils Relative: 1.8 %
Hemoglobin: 8 g/dL — ABNORMAL LOW (ref 11.7–15.5)
Lymphs Abs: 857 cells/uL (ref 850–3900)
MCV: 88.3 fL (ref 80.0–100.0)
MPV: 11.5 fL (ref 7.5–12.5)
Neutrophils Relative %: 84 %
Platelets: 314 10*3/uL (ref 140–400)
RBC: 2.9 10*6/uL — ABNORMAL LOW (ref 3.80–5.10)
RDW: 13.8 % (ref 11.0–15.0)
WBC: 12.6 10*3/uL — ABNORMAL HIGH (ref 3.8–10.8)

## 2022-10-04 LAB — HEMOGLOBIN A1C
Hgb A1c MFr Bld: 7.9 % of total Hgb — ABNORMAL HIGH (ref <5.7)
Mean Plasma Glucose: 180 mg/dL
eAG (mmol/L): 10 mmol/L

## 2022-10-04 LAB — TSH: TSH: 4.89 mIU/L — ABNORMAL HIGH (ref 0.40–4.50)

## 2022-10-04 LAB — EXTRA URINE SPECIMEN

## 2022-10-04 MED ORDER — LEVOTHYROXINE SODIUM 100 MCG PO TABS: 100.0000 ug | ORAL_TABLET | Freq: Every day | ORAL | 3 refills | Status: DC

## 2022-10-04 NOTE — Patient Instructions (Addendum)
Have increased levothyroxine to 100 mcg daily and needs TSH in 6 weeks without OV. Continue close follow up with [facility]. Is to have mammogram, bone density study, and colonoscopy.

## 2022-10-05 LAB — URINALYSIS, COMPLETE
Bacteria, UA: NONE SEEN /HPF
Bilirubin Urine: NEGATIVE
Hgb urine dipstick: NEGATIVE
Hyaline Cast: NONE SEEN /LPF
Ketones, ur: NEGATIVE
Leukocytes,Ua: NEGATIVE
Nitrite: NEGATIVE
Specific Gravity, Urine: 1.013 (ref 1.001–1.035)
pH: 5.5 (ref 5.0–8.0)

## 2022-10-05 LAB — MICROALBUMIN / CREATININE URINE RATIO
Creatinine, Urine: 60 mg/dL (ref 20–275)
Microalb Creat Ratio: 2977 mg/g creat — ABNORMAL HIGH (ref <30)
Microalb, Ur: 178.6 mg/dL

## 2022-10-07 ENCOUNTER — Encounter: Payer: Self-pay | Admitting: Internal Medicine

## 2022-10-07 ENCOUNTER — Inpatient Hospital Stay (HOSPITAL_COMMUNITY): Admission: RE | Admit: 2022-10-07 | Payer: Medicare HMO | Source: Ambulatory Visit

## 2022-10-07 ENCOUNTER — Encounter (HOSPITAL_COMMUNITY): Payer: Self-pay

## 2022-10-08 LAB — TSH

## 2022-10-14 ENCOUNTER — Other Ambulatory Visit: Payer: Self-pay | Admitting: Internal Medicine

## 2022-10-14 DIAGNOSIS — Z794 Long term (current) use of insulin: Secondary | ICD-10-CM | POA: Diagnosis not present

## 2022-10-14 DIAGNOSIS — N184 Chronic kidney disease, stage 4 (severe): Secondary | ICD-10-CM | POA: Diagnosis not present

## 2022-10-14 DIAGNOSIS — E039 Hypothyroidism, unspecified: Secondary | ICD-10-CM | POA: Diagnosis not present

## 2022-10-14 DIAGNOSIS — I10 Essential (primary) hypertension: Secondary | ICD-10-CM | POA: Diagnosis not present

## 2022-10-14 DIAGNOSIS — E1165 Type 2 diabetes mellitus with hyperglycemia: Secondary | ICD-10-CM | POA: Diagnosis not present

## 2022-10-14 DIAGNOSIS — E1142 Type 2 diabetes mellitus with diabetic polyneuropathy: Secondary | ICD-10-CM | POA: Diagnosis not present

## 2022-10-17 ENCOUNTER — Other Ambulatory Visit: Payer: Self-pay | Admitting: *Deleted

## 2022-10-17 DIAGNOSIS — N185 Chronic kidney disease, stage 5: Secondary | ICD-10-CM

## 2022-10-21 ENCOUNTER — Encounter (HOSPITAL_COMMUNITY): Payer: Medicare HMO

## 2022-10-31 NOTE — Progress Notes (Signed)
POST OPERATIVE OFFICE NOTE    CC:  F/u for surgery  HPI:  This is a 67 y.o. female who is s/p left BC AVF on 09/24/2022 by Dr. Karin Lieu   Pt states she does not have pain/numbness in the left hand.    The pt is not yet on dialysis.   Allergies  Allergen Reactions   Macrobid [Nitrofurantoin Macrocrystal] Rash    Current Outpatient Medications  Medication Sig Dispense Refill   amLODipine (NORVASC) 10 MG tablet TAKE ONE TABLET BY MOUTH ONE TIME DAILY 90 tablet 1   calcitRIOL (ROCALTROL) 0.25 MCG capsule Take 0.25 mcg by mouth daily.     cloNIDine (CATAPRES) 0.2 MG tablet TAKE ONE TABLET BY MOUTH TWICE A DAY 180 tablet 3   Continuous Blood Gluc Receiver (DEXCOM G6 RECEIVER) DEVI Dexcom G6 Receiver misc  USE AS DIRECTED     Continuous Blood Gluc Sensor (DEXCOM G6 SENSOR) MISC Dexcom G6 Sensor device  CHANGE SENSOR EVERY 10 DAYS     Continuous Blood Gluc Transmit (DEXCOM G6 TRANSMITTER) MISC Dexcom G6 Transmitter device  USE AS DIRECTED     furosemide (LASIX) 40 MG tablet Take 40 mg by mouth.     Insulin Glargine (LANTUS) 100 UNIT/ML Solostar Pen Inject 10 Units into the skin daily at 10 pm. (Patient taking differently: Inject 34 Units into the skin daily at 10 pm.) 3 mL 0   insulin lispro (HUMALOG KWIKPEN) 100 UNIT/ML KwikPen Inject 0.1 mLs (10 Units total) into the skin 2 (two) times a day. For glucose of 150-200 use two units, for 201 to 250 use three units, for 251 to 300 use five units, for 301 to 350 use seven units, for 351 or greater use nine units. 15 mL 11   Insulin Pen Needle 31G X 6 MM MISC 1 Device by Does not apply route 2 (two) times daily. 60 each 3   levothyroxine (SYNTHROID) 100 MCG tablet Take 1 tablet (100 mcg total) by mouth daily. 90 tablet 3   metoprolol tartrate (LOPRESSOR) 25 MG tablet TAKE ONE-HALF TABLET BY MOUTH TWICE A DAY 90 tablet 1   rosuvastatin (CRESTOR) 40 MG tablet TAKE ONE TABLET BY MOUTH ONE TIME DAILY 90 tablet 1   Vitamin D, Ergocalciferol,  (DRISDOL) 1.25 MG (50000 UNIT) CAPS capsule TAKE ONE CAPSULE BY MOUTH EVERY WEEK 12 capsule 1   No current facility-administered medications for this visit.     ROS:  See HPI  Physical Exam:  Today's Vitals   11/01/22 1431  BP: (!) 185/76  Pulse: 73  Temp: 98.1 F (36.7 C)  TempSrc: Temporal  SpO2: 98%  Weight: 166 lb (75.3 kg)   Body mass index is 33.25 kg/m.   Incision:  well healed. Extremities:   There is a palpable left radial pulse.   Motor and sensory are in tact.   There is a thrill/bruit present.  Access is  easily palpable but feels a little deeper more proximal but still able to palpate.     Dialysis Duplex on 11/01/2022: Findings:  +--------------------+----------+-----------------+--------+  AVF                PSV (cm/s)Flow Vol (mL/min)Comments  +--------------------+----------+-----------------+--------+  Native artery inflow   306           812                 +--------------------+----------+-----------------+--------+  AVF Anastomosis        437                               +--------------------+----------+-----------------+--------+     +------------+----------+-------------+----------+--------------+  OUTFLOW VEINPSV (cm/s)Diameter (cm)Depth (cm)   Describe     +------------+----------+-------------+----------+--------------+  Prox UA        175        0.83        1.37                   +------------+----------+-------------+----------+--------------+  Mid UA         337        0.57        1.17   branch 0.31 cm  +------------+----------+-------------+----------+--------------+  Dist UA        260        1.02        0.40                   +------------+----------+-------------+----------+--------------+  AC Fossa       368        0.89        0.87                   +------------+----------+-------------+----------+--------------+    Assessment/Plan:  This is a 66 y.o. female who is s/p:  left BC AVF  on 09/24/2022 by Dr. Karin Lieu    -the pt does not have evidence of steal. -pt's access can be used 12/25/2022.  Her fistula is easily palpable about 1/2 way up her arm.  It is still easily palpable past that but is a little deeper.  Discussed with pt that when she goes on dialysis, if they cannot use the access, she may need superficialization.  After discussion, it was decided to wait and see if it is accessible before proceeding with surgery.   -if pt has tunneled catheter, this can be removed at the discretion of the dialysis center once the pt's access has been successfully cannulated to their satisfaction.  -discussed with pt that access does not last forever and will need intervention or even new access at some point.  -ok to put glucose sensor on posterior portion of left arm if needed.  -the pt will follow up as needed.    Doreatha Massed, [facility] [phone]  Clinic MD:  Karin Lieu on call MD

## 2022-11-01 ENCOUNTER — Ambulatory Visit (HOSPITAL_COMMUNITY)
Admission: RE | Admit: 2022-11-01 | Discharge: 2022-11-01 | Disposition: A | Discharge status: Home / Self Care | Payer: Medicare HMO | Source: Ambulatory Visit | Attending: Vascular Surgery | Admitting: Vascular Surgery

## 2022-11-01 ENCOUNTER — Encounter: Payer: Self-pay | Admitting: Physician Assistant

## 2022-11-01 ENCOUNTER — Ambulatory Visit (INDEPENDENT_AMBULATORY_CARE_PROVIDER_SITE_OTHER): Payer: Medicare HMO | Admitting: Physician Assistant

## 2022-11-01 VITALS — BP 185/76 | HR 73 | Temp 98.1°F | Wt 166.0 lb

## 2022-11-01 DIAGNOSIS — N185 Chronic kidney disease, stage 5: Secondary | ICD-10-CM | POA: Insufficient documentation

## 2022-12-02 DIAGNOSIS — E039 Hypothyroidism, unspecified: Secondary | ICD-10-CM | POA: Diagnosis not present

## 2022-12-02 DIAGNOSIS — N184 Chronic kidney disease, stage 4 (severe): Secondary | ICD-10-CM | POA: Diagnosis not present

## 2022-12-02 DIAGNOSIS — E1122 Type 2 diabetes mellitus with diabetic chronic kidney disease: Secondary | ICD-10-CM | POA: Diagnosis not present

## 2022-12-02 DIAGNOSIS — D631 Anemia in chronic kidney disease: Secondary | ICD-10-CM | POA: Diagnosis not present

## 2022-12-02 DIAGNOSIS — I129 Hypertensive chronic kidney disease with stage 1 through stage 4 chronic kidney disease, or unspecified chronic kidney disease: Secondary | ICD-10-CM | POA: Diagnosis not present

## 2022-12-02 DIAGNOSIS — N189 Chronic kidney disease, unspecified: Secondary | ICD-10-CM | POA: Diagnosis not present

## 2022-12-02 DIAGNOSIS — N179 Acute kidney failure, unspecified: Secondary | ICD-10-CM | POA: Diagnosis not present

## 2022-12-10 ENCOUNTER — Encounter: Payer: Self-pay | Admitting: Nephrology

## 2022-12-19 ENCOUNTER — Encounter: Payer: Self-pay | Admitting: Nephrology

## 2022-12-19 ENCOUNTER — Inpatient Hospital Stay (HOSPITAL_COMMUNITY): Admission: RE | Admit: 2022-12-19 | Payer: Medicare HMO | Source: Ambulatory Visit

## 2022-12-19 DIAGNOSIS — D631 Anemia in chronic kidney disease: Secondary | ICD-10-CM | POA: Insufficient documentation

## 2023-01-02 ENCOUNTER — Encounter (HOSPITAL_COMMUNITY): Payer: Medicare HMO

## 2023-01-23 DIAGNOSIS — E039 Hypothyroidism, unspecified: Secondary | ICD-10-CM | POA: Diagnosis not present

## 2023-01-23 DIAGNOSIS — E1122 Type 2 diabetes mellitus with diabetic chronic kidney disease: Secondary | ICD-10-CM | POA: Diagnosis not present

## 2023-01-23 DIAGNOSIS — I129 Hypertensive chronic kidney disease with stage 1 through stage 4 chronic kidney disease, or unspecified chronic kidney disease: Secondary | ICD-10-CM | POA: Diagnosis not present

## 2023-01-23 DIAGNOSIS — N189 Chronic kidney disease, unspecified: Secondary | ICD-10-CM | POA: Diagnosis not present

## 2023-01-23 DIAGNOSIS — N184 Chronic kidney disease, stage 4 (severe): Secondary | ICD-10-CM | POA: Diagnosis not present

## 2023-01-23 DIAGNOSIS — N179 Acute kidney failure, unspecified: Secondary | ICD-10-CM | POA: Diagnosis not present

## 2023-01-23 DIAGNOSIS — D631 Anemia in chronic kidney disease: Secondary | ICD-10-CM | POA: Diagnosis not present

## 2023-01-24 LAB — LAB REPORT - SCANNED: EGFR: 5

## 2023-01-28 ENCOUNTER — Encounter: Payer: Self-pay | Admitting: Nephrology

## 2023-02-07 ENCOUNTER — Other Ambulatory Visit (HOSPITAL_COMMUNITY): Payer: Self-pay

## 2023-02-10 ENCOUNTER — Ambulatory Visit (HOSPITAL_COMMUNITY)
Admission: RE | Admit: 2023-02-10 | Discharge: 2023-02-10 | Disposition: A | Discharge status: Home / Self Care | Payer: Medicare HMO | Source: Ambulatory Visit | Attending: Nephrology | Admitting: Nephrology

## 2023-02-10 VITALS — BP 150/52 | HR 63 | Temp 97.8°F | Resp 18

## 2023-02-10 DIAGNOSIS — N184 Chronic kidney disease, stage 4 (severe): Secondary | ICD-10-CM | POA: Diagnosis not present

## 2023-02-10 DIAGNOSIS — D631 Anemia in chronic kidney disease: Secondary | ICD-10-CM | POA: Diagnosis not present

## 2023-02-10 LAB — POCT HEMOGLOBIN-HEMACUE: Hemoglobin: 7 g/dL — ABNORMAL LOW (ref 12.0–15.0)

## 2023-02-10 MED ORDER — EPOETIN ALFA-EPBX 10000 UNIT/ML IJ SOLN
INTRAMUSCULAR | Status: AC
  Administered 2023-02-10: 20000 [IU] via SUBCUTANEOUS
  Filled 2023-02-10: qty 2

## 2023-02-10 MED ORDER — EPOETIN ALFA-EPBX 10000 UNIT/ML IJ SOLN: 20000.0000 [IU] | INTRAMUSCULAR | Status: DC

## 2023-02-10 MED ORDER — SODIUM CHLORIDE 0.9 % IV SOLN
510.0000 mg | INTRAVENOUS | Status: DC
  Administered 2023-02-10: 510 mg via INTRAVENOUS
  Filled 2023-02-10: qty 510

## 2023-02-10 NOTE — Progress Notes (Signed)
Pt at the [facility] infusion clinic today for first dose of retacrit and feraheme.  Hemocue today is 7 PT stated she has shortness of breath upon exertion, but denies seeing any bleeding.  Reported the above to [facility] and no new orders were received.  May proceed with current therapy plan per Dr Kathrene Bongo.

## 2023-02-21 ENCOUNTER — Ambulatory Visit: Payer: Medicare HMO

## 2023-02-24 ENCOUNTER — Encounter (HOSPITAL_COMMUNITY)
Admission: RE | Admit: 2023-02-24 | Discharge: 2023-02-24 | Disposition: A | Discharge status: Home / Self Care | Payer: Medicare HMO | Source: Ambulatory Visit | Attending: Nephrology | Admitting: Nephrology

## 2023-02-24 VITALS — BP 180/65 | HR 63 | Temp 97.9°F | Resp 17

## 2023-02-24 DIAGNOSIS — N184 Chronic kidney disease, stage 4 (severe): Secondary | ICD-10-CM | POA: Insufficient documentation

## 2023-02-24 DIAGNOSIS — D631 Anemia in chronic kidney disease: Secondary | ICD-10-CM | POA: Insufficient documentation

## 2023-02-24 LAB — POCT HEMOGLOBIN-HEMACUE: Hemoglobin: 8.2 g/dL — ABNORMAL LOW (ref 12.0–15.0)

## 2023-02-24 MED ORDER — EPOETIN ALFA-EPBX 10000 UNIT/ML IJ SOLN
INTRAMUSCULAR | Status: AC
  Administered 2023-02-24: 20000 [IU] via SUBCUTANEOUS
  Filled 2023-02-24: qty 2

## 2023-02-24 MED ORDER — EPOETIN ALFA-EPBX 10000 UNIT/ML IJ SOLN: 20000.0000 [IU] | INTRAMUSCULAR | Status: DC

## 2023-02-24 MED ORDER — SODIUM CHLORIDE 0.9 % IV SOLN
510.0000 mg | INTRAVENOUS | Status: DC
  Administered 2023-02-24: 510 mg via INTRAVENOUS
  Filled 2023-02-24: qty 510

## 2023-03-10 ENCOUNTER — Encounter (HOSPITAL_COMMUNITY): Payer: Medicare HMO

## 2023-03-12 ENCOUNTER — Other Ambulatory Visit: Payer: Self-pay | Admitting: Internal Medicine

## 2023-03-17 ENCOUNTER — Ambulatory Visit (HOSPITAL_COMMUNITY)
Admission: RE | Admit: 2023-03-17 | Discharge: 2023-03-17 | Disposition: A | Discharge status: Home / Self Care | Payer: Medicare HMO | Source: Ambulatory Visit | Attending: Nephrology | Admitting: Nephrology

## 2023-03-17 VITALS — BP 176/71 | HR 73 | Temp 97.0°F | Resp 18

## 2023-03-17 DIAGNOSIS — N184 Chronic kidney disease, stage 4 (severe): Secondary | ICD-10-CM | POA: Insufficient documentation

## 2023-03-17 DIAGNOSIS — D631 Anemia in chronic kidney disease: Secondary | ICD-10-CM | POA: Diagnosis not present

## 2023-03-17 LAB — RENAL FUNCTION PANEL
Albumin: 3.6 g/dL (ref 3.5–5.0)
Anion gap: 13 (ref 5–15)
BUN: 79 mg/dL — ABNORMAL HIGH (ref 8–23)
CO2: 18 mmol/L — ABNORMAL LOW (ref 22–32)
Calcium: 8.9 mg/dL (ref 8.9–10.3)
Chloride: 103 mmol/L (ref 98–111)
Creatinine, Ser: 8.02 mg/dL — ABNORMAL HIGH (ref 0.44–1.00)
GFR, Estimated: 5 mL/min — ABNORMAL LOW (ref >60)
Glucose, Bld: 136 mg/dL — ABNORMAL HIGH (ref 70–99)
Phosphorus: 9.3 mg/dL — ABNORMAL HIGH (ref 2.5–4.6)
Potassium: 4.5 mmol/L (ref 3.5–5.1)
Sodium: 134 mmol/L — ABNORMAL LOW (ref 135–145)

## 2023-03-17 LAB — FERRITIN: Ferritin: 437 ng/mL — ABNORMAL HIGH (ref 11–307)

## 2023-03-17 LAB — IRON AND TIBC
Iron: 36 ug/dL (ref 28–170)
Saturation Ratios: 14 % (ref 10.4–31.8)
TIBC: 259 ug/dL (ref 250–450)
UIBC: 223 ug/dL

## 2023-03-17 LAB — POCT HEMOGLOBIN-HEMACUE: Hemoglobin: 9.5 g/dL — ABNORMAL LOW (ref 12.0–15.0)

## 2023-03-17 MED ORDER — EPOETIN ALFA-EPBX 10000 UNIT/ML IJ SOLN
INTRAMUSCULAR | Status: AC
  Filled 2023-03-17: qty 2

## 2023-03-17 MED ORDER — EPOETIN ALFA-EPBX 10000 UNIT/ML IJ SOLN
20000.0000 [IU] | INTRAMUSCULAR | Status: DC
  Administered 2023-03-17: 20000 [IU] via SUBCUTANEOUS

## 2023-03-18 LAB — PTH, INTACT AND CALCIUM
Calcium, Total (PTH): 9.1 mg/dL (ref 8.7–10.3)
PTH: 184 pg/mL — ABNORMAL HIGH (ref 15–65)

## 2023-03-24 ENCOUNTER — Encounter (HOSPITAL_COMMUNITY): Payer: Medicare HMO

## 2023-03-27 DIAGNOSIS — N184 Chronic kidney disease, stage 4 (severe): Secondary | ICD-10-CM | POA: Diagnosis not present

## 2023-03-27 DIAGNOSIS — E1122 Type 2 diabetes mellitus with diabetic chronic kidney disease: Secondary | ICD-10-CM | POA: Diagnosis not present

## 2023-03-27 DIAGNOSIS — D631 Anemia in chronic kidney disease: Secondary | ICD-10-CM | POA: Diagnosis not present

## 2023-03-27 DIAGNOSIS — N179 Acute kidney failure, unspecified: Secondary | ICD-10-CM | POA: Diagnosis not present

## 2023-03-27 DIAGNOSIS — I129 Hypertensive chronic kidney disease with stage 1 through stage 4 chronic kidney disease, or unspecified chronic kidney disease: Secondary | ICD-10-CM | POA: Diagnosis not present

## 2023-03-27 DIAGNOSIS — E039 Hypothyroidism, unspecified: Secondary | ICD-10-CM | POA: Diagnosis not present

## 2023-03-28 ENCOUNTER — Other Ambulatory Visit (HOSPITAL_COMMUNITY): Payer: Self-pay

## 2023-03-31 ENCOUNTER — Inpatient Hospital Stay (HOSPITAL_COMMUNITY): Admission: RE | Admit: 2023-03-31 | Payer: Medicare HMO | Source: Ambulatory Visit

## 2023-04-03 ENCOUNTER — Ambulatory Visit: Payer: Medicare HMO

## 2023-04-07 ENCOUNTER — Encounter (HOSPITAL_COMMUNITY)
Admission: RE | Admit: 2023-04-07 | Discharge: 2023-04-07 | Disposition: A | Discharge status: Home / Self Care | Payer: Medicare HMO | Source: Ambulatory Visit | Attending: Nephrology | Admitting: Nephrology

## 2023-04-07 VITALS — BP 164/66 | HR 66 | Temp 98.0°F | Resp 17 | Wt 163.0 lb

## 2023-04-07 DIAGNOSIS — D631 Anemia in chronic kidney disease: Secondary | ICD-10-CM | POA: Insufficient documentation

## 2023-04-07 DIAGNOSIS — N184 Chronic kidney disease, stage 4 (severe): Secondary | ICD-10-CM | POA: Insufficient documentation

## 2023-04-07 LAB — POCT HEMOGLOBIN-HEMACUE: Hemoglobin: 9 g/dL — ABNORMAL LOW (ref 12.0–15.0)

## 2023-04-07 MED ORDER — EPOETIN ALFA-EPBX 10000 UNIT/ML IJ SOLN
INTRAMUSCULAR | Status: AC
  Administered 2023-04-07: 20000 [IU] via SUBCUTANEOUS
  Filled 2023-04-07: qty 2

## 2023-04-07 MED ORDER — EPOETIN ALFA-EPBX 10000 UNIT/ML IJ SOLN: 20000.0000 [IU] | INTRAMUSCULAR | Status: DC

## 2023-04-07 MED ORDER — SODIUM CHLORIDE 0.9 % IV SOLN
510.0000 mg | INTRAVENOUS | Status: DC
  Administered 2023-04-07: 510 mg via INTRAVENOUS
  Filled 2023-04-07: qty 510

## 2023-04-14 ENCOUNTER — Encounter (HOSPITAL_COMMUNITY): Payer: Medicare HMO

## 2023-04-21 ENCOUNTER — Encounter (HOSPITAL_COMMUNITY): Payer: Medicare HMO

## 2023-04-27 ENCOUNTER — Inpatient Hospital Stay (HOSPITAL_COMMUNITY): Payer: Medicare HMO

## 2023-04-27 ENCOUNTER — Other Ambulatory Visit: Payer: Self-pay

## 2023-04-27 ENCOUNTER — Emergency Department (HOSPITAL_COMMUNITY): Payer: Medicare HMO

## 2023-04-27 ENCOUNTER — Inpatient Hospital Stay (HOSPITAL_COMMUNITY)
Admission: EM | Admit: 2023-04-27 | Discharge: 2023-05-24 | DRG: 871 | Disposition: E | Payer: Medicare HMO | Attending: Pulmonary Disease | Admitting: Pulmonary Disease

## 2023-04-27 ENCOUNTER — Encounter (HOSPITAL_COMMUNITY): Payer: Self-pay

## 2023-04-27 DIAGNOSIS — Z794 Long term (current) use of insulin: Secondary | ICD-10-CM

## 2023-04-27 DIAGNOSIS — Z883 Allergy status to other anti-infective agents status: Secondary | ICD-10-CM

## 2023-04-27 DIAGNOSIS — Z8616 Personal history of COVID-19: Secondary | ICD-10-CM | POA: Diagnosis not present

## 2023-04-27 DIAGNOSIS — Z8249 Family history of ischemic heart disease and other diseases of the circulatory system: Secondary | ICD-10-CM

## 2023-04-27 DIAGNOSIS — G9341 Metabolic encephalopathy: Secondary | ICD-10-CM | POA: Diagnosis present

## 2023-04-27 DIAGNOSIS — J101 Influenza due to other identified influenza virus with other respiratory manifestations: Secondary | ICD-10-CM | POA: Diagnosis present

## 2023-04-27 DIAGNOSIS — D75839 Thrombocytosis, unspecified: Secondary | ICD-10-CM | POA: Diagnosis present

## 2023-04-27 DIAGNOSIS — R55 Syncope and collapse: Secondary | ICD-10-CM | POA: Diagnosis not present

## 2023-04-27 DIAGNOSIS — J1001 Influenza due to other identified influenza virus with the same other identified influenza virus pneumonia: Secondary | ICD-10-CM | POA: Diagnosis not present

## 2023-04-27 DIAGNOSIS — J9601 Acute respiratory failure with hypoxia: Secondary | ICD-10-CM | POA: Diagnosis present

## 2023-04-27 DIAGNOSIS — I160 Hypertensive urgency: Secondary | ICD-10-CM | POA: Diagnosis present

## 2023-04-27 DIAGNOSIS — R7989 Other specified abnormal findings of blood chemistry: Secondary | ICD-10-CM | POA: Diagnosis present

## 2023-04-27 DIAGNOSIS — R0689 Other abnormalities of breathing: Secondary | ICD-10-CM | POA: Diagnosis not present

## 2023-04-27 DIAGNOSIS — J810 Acute pulmonary edema: Secondary | ICD-10-CM | POA: Diagnosis not present

## 2023-04-27 DIAGNOSIS — R0989 Other specified symptoms and signs involving the circulatory and respiratory systems: Secondary | ICD-10-CM | POA: Diagnosis not present

## 2023-04-27 DIAGNOSIS — R000 Tachycardia, unspecified: Secondary | ICD-10-CM | POA: Diagnosis not present

## 2023-04-27 DIAGNOSIS — I4510 Unspecified right bundle-branch block: Secondary | ICD-10-CM | POA: Diagnosis present

## 2023-04-27 DIAGNOSIS — Z66 Do not resuscitate: Secondary | ICD-10-CM | POA: Diagnosis not present

## 2023-04-27 DIAGNOSIS — I422 Other hypertrophic cardiomyopathy: Secondary | ICD-10-CM | POA: Diagnosis present

## 2023-04-27 DIAGNOSIS — I120 Hypertensive chronic kidney disease with stage 5 chronic kidney disease or end stage renal disease: Secondary | ICD-10-CM | POA: Diagnosis present

## 2023-04-27 DIAGNOSIS — D649 Anemia, unspecified: Secondary | ICD-10-CM | POA: Diagnosis not present

## 2023-04-27 DIAGNOSIS — I469 Cardiac arrest, cause unspecified: Secondary | ICD-10-CM | POA: Diagnosis not present

## 2023-04-27 DIAGNOSIS — E1122 Type 2 diabetes mellitus with diabetic chronic kidney disease: Secondary | ICD-10-CM | POA: Diagnosis present

## 2023-04-27 DIAGNOSIS — J9811 Atelectasis: Secondary | ICD-10-CM | POA: Diagnosis present

## 2023-04-27 DIAGNOSIS — I4891 Unspecified atrial fibrillation: Secondary | ICD-10-CM | POA: Diagnosis not present

## 2023-04-27 DIAGNOSIS — Z4682 Encounter for fitting and adjustment of non-vascular catheter: Secondary | ICD-10-CM | POA: Diagnosis not present

## 2023-04-27 DIAGNOSIS — Z800 Family history of malignant neoplasm of digestive organs: Secondary | ICD-10-CM

## 2023-04-27 DIAGNOSIS — R4182 Altered mental status, unspecified: Secondary | ICD-10-CM | POA: Diagnosis not present

## 2023-04-27 DIAGNOSIS — E1165 Type 2 diabetes mellitus with hyperglycemia: Secondary | ICD-10-CM

## 2023-04-27 DIAGNOSIS — M898X9 Other specified disorders of bone, unspecified site: Secondary | ICD-10-CM | POA: Diagnosis present

## 2023-04-27 DIAGNOSIS — Z515 Encounter for palliative care: Secondary | ICD-10-CM

## 2023-04-27 DIAGNOSIS — Z79890 Hormone replacement therapy (postmenopausal): Secondary | ICD-10-CM

## 2023-04-27 DIAGNOSIS — E66811 Obesity, class 1: Secondary | ICD-10-CM | POA: Diagnosis present

## 2023-04-27 DIAGNOSIS — S32511A Fracture of superior rim of right pubis, initial encounter for closed fracture: Secondary | ICD-10-CM | POA: Diagnosis not present

## 2023-04-27 DIAGNOSIS — N179 Acute kidney failure, unspecified: Secondary | ICD-10-CM | POA: Diagnosis present

## 2023-04-27 DIAGNOSIS — Z79899 Other long term (current) drug therapy: Secondary | ICD-10-CM

## 2023-04-27 DIAGNOSIS — J111 Influenza due to unidentified influenza virus with other respiratory manifestations: Principal | ICD-10-CM

## 2023-04-27 DIAGNOSIS — R4020 Unspecified coma: Secondary | ICD-10-CM | POA: Diagnosis not present

## 2023-04-27 DIAGNOSIS — E872 Acidosis, unspecified: Secondary | ICD-10-CM | POA: Diagnosis present

## 2023-04-27 DIAGNOSIS — Z1152 Encounter for screening for COVID-19: Secondary | ICD-10-CM

## 2023-04-27 DIAGNOSIS — R231 Pallor: Secondary | ICD-10-CM | POA: Diagnosis not present

## 2023-04-27 DIAGNOSIS — E039 Hypothyroidism, unspecified: Secondary | ICD-10-CM | POA: Diagnosis present

## 2023-04-27 DIAGNOSIS — I517 Cardiomegaly: Secondary | ICD-10-CM | POA: Diagnosis not present

## 2023-04-27 DIAGNOSIS — Z90710 Acquired absence of both cervix and uterus: Secondary | ICD-10-CM

## 2023-04-27 DIAGNOSIS — F419 Anxiety disorder, unspecified: Secondary | ICD-10-CM | POA: Diagnosis present

## 2023-04-27 DIAGNOSIS — I468 Cardiac arrest due to other underlying condition: Secondary | ICD-10-CM | POA: Diagnosis not present

## 2023-04-27 DIAGNOSIS — E8771 Transfusion associated circulatory overload: Secondary | ICD-10-CM

## 2023-04-27 DIAGNOSIS — R918 Other nonspecific abnormal finding of lung field: Secondary | ICD-10-CM | POA: Diagnosis not present

## 2023-04-27 DIAGNOSIS — D638 Anemia in other chronic diseases classified elsewhere: Secondary | ICD-10-CM | POA: Diagnosis present

## 2023-04-27 DIAGNOSIS — D631 Anemia in chronic kidney disease: Secondary | ICD-10-CM | POA: Diagnosis present

## 2023-04-27 DIAGNOSIS — R0602 Shortness of breath: Secondary | ICD-10-CM | POA: Diagnosis not present

## 2023-04-27 DIAGNOSIS — Z992 Dependence on renal dialysis: Secondary | ICD-10-CM

## 2023-04-27 DIAGNOSIS — Z833 Family history of diabetes mellitus: Secondary | ICD-10-CM

## 2023-04-27 DIAGNOSIS — E8770 Fluid overload, unspecified: Secondary | ICD-10-CM | POA: Diagnosis present

## 2023-04-27 DIAGNOSIS — N186 End stage renal disease: Secondary | ICD-10-CM | POA: Diagnosis not present

## 2023-04-27 DIAGNOSIS — Z6830 Body mass index (BMI) 30.0-30.9, adult: Secondary | ICD-10-CM

## 2023-04-27 DIAGNOSIS — E8779 Other fluid overload: Secondary | ICD-10-CM | POA: Diagnosis not present

## 2023-04-27 DIAGNOSIS — E785 Hyperlipidemia, unspecified: Secondary | ICD-10-CM | POA: Diagnosis present

## 2023-04-27 DIAGNOSIS — A4189 Other specified sepsis: Principal | ICD-10-CM | POA: Diagnosis present

## 2023-04-27 DIAGNOSIS — E669 Obesity, unspecified: Secondary | ICD-10-CM | POA: Diagnosis present

## 2023-04-27 DIAGNOSIS — N185 Chronic kidney disease, stage 5: Secondary | ICD-10-CM | POA: Diagnosis not present

## 2023-04-27 DIAGNOSIS — I499 Cardiac arrhythmia, unspecified: Secondary | ICD-10-CM | POA: Diagnosis not present

## 2023-04-27 LAB — COMPREHENSIVE METABOLIC PANEL
ALT: 11 U/L (ref 0–44)
AST: 16 U/L (ref 15–41)
Albumin: 1.9 g/dL — ABNORMAL LOW (ref 3.5–5.0)
Alkaline Phosphatase: 67 U/L (ref 38–126)
Anion gap: 18 — ABNORMAL HIGH (ref 5–15)
BUN: 95 mg/dL — ABNORMAL HIGH (ref 8–23)
CO2: 13 mmol/L — ABNORMAL LOW (ref 22–32)
Calcium: 8 mg/dL — ABNORMAL LOW (ref 8.9–10.3)
Chloride: 105 mmol/L (ref 98–111)
Creatinine, Ser: 9.57 mg/dL — ABNORMAL HIGH (ref 0.44–1.00)
GFR, Estimated: 4 mL/min — ABNORMAL LOW (ref >60)
Glucose, Bld: 189 mg/dL — ABNORMAL HIGH (ref 70–99)
Potassium: 3.5 mmol/L (ref 3.5–5.1)
Sodium: 136 mmol/L (ref 135–145)
Total Bilirubin: 0.6 mg/dL (ref 0.0–1.2)
Total Protein: 6 g/dL — ABNORMAL LOW (ref 6.5–8.1)

## 2023-04-27 LAB — TSH: TSH: 3.037 u[IU]/mL (ref 0.350–4.500)

## 2023-04-27 LAB — I-STAT VENOUS BLOOD GAS, ED
Acid-base deficit: 18 mmol/L — ABNORMAL HIGH (ref 0.0–2.0)
Acid-base deficit: 12 mmol/L — ABNORMAL HIGH (ref 0.0–2.0)
Bicarbonate: 7.6 mmol/L — ABNORMAL LOW (ref 20.0–28.0)
Bicarbonate: 14.3 mmol/L — ABNORMAL LOW (ref 20.0–28.0)
Calcium, Ion: 0.75 mmol/L — CL (ref 1.15–1.40)
Calcium, Ion: 1.07 mmol/L — ABNORMAL LOW (ref 1.15–1.40)
HCT: 17 % — ABNORMAL LOW (ref 36.0–46.0)
HCT: 27 % — ABNORMAL LOW (ref 36.0–46.0)
Hemoglobin: 5.8 g/dL — CL (ref 12.0–15.0)
Hemoglobin: 9.2 g/dL — ABNORMAL LOW (ref 12.0–15.0)
O2 Saturation: 87 %
O2 Saturation: 80 %
Potassium: <2 mmol/L — CL (ref 3.5–5.1)
Potassium: 3.5 mmol/L (ref 3.5–5.1)
Sodium: 148 mmol/L — ABNORMAL HIGH (ref 135–145)
Sodium: 137 mmol/L (ref 135–145)
TCO2: 8 mmol/L — ABNORMAL LOW (ref 22–32)
TCO2: 15 mmol/L — ABNORMAL LOW (ref 22–32)
pCO2, Ven: 18.4 mm[Hg] — CL (ref 44–60)
pCO2, Ven: 32.2 mm[Hg] — ABNORMAL LOW (ref 44–60)
pH, Ven: 7.223 — ABNORMAL LOW (ref 7.25–7.43)
pH, Ven: 7.255 (ref 7.25–7.43)
pO2, Ven: 62 mm[Hg] — ABNORMAL HIGH (ref 32–45)
pO2, Ven: 50 mm[Hg] — ABNORMAL HIGH (ref 32–45)

## 2023-04-27 LAB — MAGNESIUM: Magnesium: 2 mg/dL (ref 1.7–2.4)

## 2023-04-27 LAB — CBG MONITORING, ED: Glucose-Capillary: 83 mg/dL (ref 70–99)

## 2023-04-27 LAB — MRSA NEXT GEN BY PCR, NASAL: MRSA by PCR Next Gen: DETECTED — AB

## 2023-04-27 LAB — CBC WITH DIFFERENTIAL/PLATELET
Abs Immature Granulocytes: 0.29 10*3/uL — ABNORMAL HIGH (ref 0.00–0.07)
Basophils Absolute: 0.1 10*3/uL (ref 0.0–0.1)
Basophils Relative: 0 %
Eosinophils Absolute: 0.1 10*3/uL (ref 0.0–0.5)
Eosinophils Relative: 0 %
HCT: 27.6 % — ABNORMAL LOW (ref 36.0–46.0)
Hemoglobin: 8.7 g/dL — ABNORMAL LOW (ref 12.0–15.0)
Immature Granulocytes: 2 %
Lymphocytes Relative: 1 %
Lymphs Abs: 0.2 10*3/uL — ABNORMAL LOW (ref 0.7–4.0)
MCH: 27.6 pg (ref 26.0–34.0)
MCHC: 31.5 g/dL (ref 30.0–36.0)
MCV: 87.6 fL (ref 80.0–100.0)
Monocytes Absolute: 1.1 10*3/uL — ABNORMAL HIGH (ref 0.1–1.0)
Monocytes Relative: 6 %
Neutro Abs: 17.3 10*3/uL — ABNORMAL HIGH (ref 1.7–7.7)
Neutrophils Relative %: 91 %
Platelets: 422 10*3/uL — ABNORMAL HIGH (ref 150–400)
RBC: 3.15 MIL/uL — ABNORMAL LOW (ref 3.87–5.11)
RDW: 17.4 % — ABNORMAL HIGH (ref 11.5–15.5)
WBC: 19 10*3/uL — ABNORMAL HIGH (ref 4.0–10.5)
nRBC: 0 % (ref 0.0–0.2)

## 2023-04-27 LAB — RESP PANEL BY RT-PCR (RSV, FLU A&B, COVID)  RVPGX2
Influenza A by PCR: POSITIVE — AB
Influenza B by PCR: NEGATIVE
Resp Syncytial Virus by PCR: NEGATIVE
SARS Coronavirus 2 by RT PCR: NEGATIVE

## 2023-04-27 LAB — I-STAT CHEM 8, ED
BUN: 89 mg/dL — ABNORMAL HIGH (ref 8–23)
Calcium, Ion: 1.03 mmol/L — ABNORMAL LOW (ref 1.15–1.40)
Chloride: 107 mmol/L (ref 98–111)
Creatinine, Ser: 9.8 mg/dL — ABNORMAL HIGH (ref 0.44–1.00)
Glucose, Bld: 185 mg/dL — ABNORMAL HIGH (ref 70–99)
HCT: 27 % — ABNORMAL LOW (ref 36.0–46.0)
Hemoglobin: 9.2 g/dL — ABNORMAL LOW (ref 12.0–15.0)
Potassium: 3.5 mmol/L (ref 3.5–5.1)
Sodium: 137 mmol/L (ref 135–145)
TCO2: 15 mmol/L — ABNORMAL LOW (ref 22–32)

## 2023-04-27 LAB — GLUCOSE, CAPILLARY
Glucose-Capillary: 73 mg/dL (ref 70–99)
Glucose-Capillary: 76 mg/dL (ref 70–99)
Glucose-Capillary: 84 mg/dL (ref 70–99)

## 2023-04-27 LAB — I-STAT CG4 LACTIC ACID, ED
Lactic Acid, Venous: 1 mmol/L (ref 0.5–1.9)
Lactic Acid, Venous: 1.6 mmol/L (ref 0.5–1.9)
Lactic Acid, Venous: 0.6 mmol/L (ref 0.5–1.9)

## 2023-04-27 LAB — PROTIME-INR
INR: 1.2 (ref 0.8–1.2)
Prothrombin Time: 15.3 s — ABNORMAL HIGH (ref 11.4–15.2)

## 2023-04-27 LAB — ABO/RH: ABO/RH(D): A POS

## 2023-04-27 LAB — BRAIN NATRIURETIC PEPTIDE: B Natriuretic Peptide: 1076.5 pg/mL — ABNORMAL HIGH (ref 0.0–100.0)

## 2023-04-27 LAB — TYPE AND SCREEN
ABO/RH(D): A POS
Antibody Screen: NEGATIVE

## 2023-04-27 LAB — TROPONIN I (HIGH SENSITIVITY)
Troponin I (High Sensitivity): 36 ng/L — ABNORMAL HIGH (ref <18)
Troponin I (High Sensitivity): 31 ng/L — ABNORMAL HIGH (ref <18)

## 2023-04-27 LAB — HIV ANTIBODY (ROUTINE TESTING W REFLEX): HIV Screen 4th Generation wRfx: NONREACTIVE

## 2023-04-27 LAB — PROCALCITONIN: Procalcitonin: 2.99 ng/mL

## 2023-04-27 MED ORDER — ACETAMINOPHEN 325 MG PO TABS
650.0000 mg | ORAL_TABLET | Freq: Four times a day (QID) | ORAL | Status: DC | PRN
  Filled 2023-04-27: qty 2

## 2023-04-27 MED ORDER — ALBUTEROL SULFATE (2.5 MG/3ML) 0.083% IN NEBU: 2.5000 mg | INHALATION_SOLUTION | RESPIRATORY_TRACT | Status: DC | PRN

## 2023-04-27 MED ORDER — POLYETHYLENE GLYCOL 3350 17 G PO PACK
17.0000 g | PACK | Freq: Every day | ORAL | Status: DC
  Administered 2023-04-28: 17 g
  Filled 2023-04-27: qty 1

## 2023-04-27 MED ORDER — INSULIN ASPART 100 UNIT/ML IJ SOLN: 0.0000 [IU] | Freq: Three times a day (TID) | INTRAMUSCULAR | Status: DC

## 2023-04-27 MED ORDER — SODIUM CHLORIDE 0.9 % IV SOLN
2.0000 g | INTRAVENOUS | Status: DC
  Administered 2023-04-28 – 2023-04-29 (×2): 2 g via INTRAVENOUS
  Filled 2023-04-27 (×2): qty 20

## 2023-04-27 MED ORDER — CHLORHEXIDINE GLUCONATE CLOTH 2 % EX PADS
6.0000 | MEDICATED_PAD | Freq: Every day | CUTANEOUS | Status: DC
  Administered 2023-04-27 – 2023-04-29 (×2): 6 via TOPICAL

## 2023-04-27 MED ORDER — FENTANYL BOLUS VIA INFUSION: 25.0000 ug | INTRAVENOUS | Status: DC | PRN

## 2023-04-27 MED ORDER — AMLODIPINE BESYLATE 10 MG PO TABS
10.0000 mg | ORAL_TABLET | Freq: Every day | ORAL | Status: DC
  Administered 2023-04-27 – 2023-04-28 (×2): 10 mg
  Filled 2023-04-27 (×2): qty 1

## 2023-04-27 MED ORDER — ONDANSETRON HCL 4 MG PO TABS: 4.0000 mg | ORAL_TABLET | Freq: Four times a day (QID) | ORAL | Status: DC | PRN

## 2023-04-27 MED ORDER — PANTOPRAZOLE SODIUM 40 MG IV SOLR
40.0000 mg | INTRAVENOUS | Status: DC
  Administered 2023-04-28: 40 mg via INTRAVENOUS
  Filled 2023-04-27 (×2): qty 10

## 2023-04-27 MED ORDER — FENTANYL CITRATE PF 50 MCG/ML IJ SOSY
25.0000 ug | PREFILLED_SYRINGE | Freq: Once | INTRAMUSCULAR | Status: AC
  Administered 2023-04-27: 25 ug via INTRAVENOUS
  Filled 2023-04-27: qty 1

## 2023-04-27 MED ORDER — DEXTROSE 50 % IV SOLN
12.5000 g | INTRAVENOUS | Status: AC
Start: 2023-04-27 — End: 2023-04-27

## 2023-04-27 MED ORDER — INSULIN GLARGINE-YFGN 100 UNIT/ML ~~LOC~~ SOLN: 20.0000 [IU] | Freq: Every day | SUBCUTANEOUS | Status: DC

## 2023-04-27 MED ORDER — POLYETHYLENE GLYCOL 3350 17 G PO PACK: 17.0000 g | PACK | Freq: Every day | ORAL | Status: DC | PRN

## 2023-04-27 MED ORDER — SUCCINYLCHOLINE CHLORIDE 20 MG/ML IJ SOLN
INTRAMUSCULAR | Status: AC | PRN
  Administered 2023-04-27: 100 mg via INTRAVENOUS

## 2023-04-27 MED ORDER — FENTANYL 2500MCG IN NS 250ML (10MCG/ML) PREMIX INFUSION
25.0000 ug/h | INTRAVENOUS | Status: DC
  Administered 2023-04-27 (×2): 25 ug/h via INTRAVENOUS
  Filled 2023-04-27 (×2): qty 250

## 2023-04-27 MED ORDER — PROPOFOL 1000 MG/100ML IV EMUL
0.0000 ug/kg/min | INTRAVENOUS | Status: DC
  Administered 2023-04-27: 25 ug/kg/min via INTRAVENOUS
  Administered 2023-04-27: 15 ug/kg/min via INTRAVENOUS
  Administered 2023-04-28: 25 ug/kg/min via INTRAVENOUS
  Administered 2023-04-28 – 2023-04-29 (×4): 35 ug/kg/min via INTRAVENOUS
  Filled 2023-04-27 (×7): qty 100

## 2023-04-27 MED ORDER — CLONIDINE HCL 0.2 MG PO TABS
0.2000 mg | ORAL_TABLET | Freq: Two times a day (BID) | ORAL | Status: DC
  Administered 2023-04-28 (×2): 0.2 mg
  Filled 2023-04-27 (×4): qty 1

## 2023-04-27 MED ORDER — DOCUSATE SODIUM 50 MG/5ML PO LIQD
100.0000 mg | Freq: Two times a day (BID) | ORAL | Status: DC | PRN
  Administered 2023-04-27 (×2): 100 mg

## 2023-04-27 MED ORDER — ETOMIDATE 2 MG/ML IV SOLN
INTRAVENOUS | Status: AC | PRN
  Administered 2023-04-27: 20 mg via INTRAVENOUS

## 2023-04-27 MED ORDER — SODIUM CHLORIDE 0.9% FLUSH
3.0000 mL | Freq: Two times a day (BID) | INTRAVENOUS | Status: DC
  Administered 2023-04-27 – 2023-04-28 (×3): 3 mL via INTRAVENOUS

## 2023-04-27 MED ORDER — DOCUSATE SODIUM 50 MG/5ML PO LIQD
100.0000 mg | Freq: Two times a day (BID) | ORAL | Status: DC
  Administered 2023-04-27 – 2023-04-28 (×3): 100 mg
  Filled 2023-04-27 (×3): qty 10

## 2023-04-27 MED ORDER — EPINEPHRINE 1 MG/10ML IJ SOSY
PREFILLED_SYRINGE | INTRAMUSCULAR | Status: AC | PRN
  Administered 2023-04-27: 1 mg via INTRAVENOUS

## 2023-04-27 MED ORDER — LEVOTHYROXINE SODIUM 100 MCG PO TABS
100.0000 ug | ORAL_TABLET | Freq: Every day | ORAL | Status: DC
  Administered 2023-04-28 – 2023-04-29 (×2): 100 ug
  Filled 2023-04-27 (×2): qty 1

## 2023-04-27 MED ORDER — ONDANSETRON HCL 4 MG/2ML IJ SOLN: 4.0000 mg | Freq: Four times a day (QID) | INTRAMUSCULAR | Status: DC | PRN

## 2023-04-27 MED ORDER — SODIUM CHLORIDE 0.9 % IV SOLN
2.0000 g | Freq: Once | INTRAVENOUS | Status: AC
  Administered 2023-04-27: 2 g via INTRAVENOUS
  Filled 2023-04-27: qty 20

## 2023-04-27 MED ORDER — ROSUVASTATIN CALCIUM 20 MG PO TABS
40.0000 mg | ORAL_TABLET | Freq: Every day | ORAL | Status: DC
  Filled 2023-04-27: qty 2

## 2023-04-27 MED ORDER — FUROSEMIDE 10 MG/ML IJ SOLN
80.0000 mg | Freq: Once | INTRAMUSCULAR | Status: AC
  Administered 2023-04-27: 80 mg via INTRAVENOUS
  Filled 2023-04-27: qty 8

## 2023-04-27 MED ORDER — HEPARIN SODIUM (PORCINE) 5000 UNIT/ML IJ SOLN
5000.0000 [IU] | Freq: Three times a day (TID) | INTRAMUSCULAR | Status: DC
  Administered 2023-04-27 – 2023-04-29 (×6): 5000 [IU] via SUBCUTANEOUS
  Filled 2023-04-27 (×6): qty 1

## 2023-04-27 MED ORDER — INSULIN GLARGINE-YFGN 100 UNIT/ML ~~LOC~~ SOLN
15.0000 [IU] | Freq: Every day | SUBCUTANEOUS | Status: DC
  Administered 2023-04-29: 15 [IU] via SUBCUTANEOUS
  Filled 2023-04-27 (×5): qty 0.15

## 2023-04-27 MED ORDER — HYDRALAZINE HCL 20 MG/ML IJ SOLN
10.0000 mg | INTRAMUSCULAR | Status: DC | PRN
  Administered 2023-04-28: 10 mg via INTRAVENOUS
  Filled 2023-04-27: qty 1

## 2023-04-27 MED ORDER — HEPARIN SODIUM (PORCINE) 5000 UNIT/ML IJ SOLN: 5000.0000 [IU] | Freq: Three times a day (TID) | INTRAMUSCULAR | Status: DC

## 2023-04-27 MED ORDER — DEXTROSE 50 % IV SOLN
INTRAVENOUS | Status: AC
  Administered 2023-04-27: 12.5 g via INTRAVENOUS
  Filled 2023-04-27: qty 50

## 2023-04-27 MED ORDER — ACETAMINOPHEN 650 MG RE SUPP: 650.0000 mg | Freq: Four times a day (QID) | RECTAL | Status: DC | PRN

## 2023-04-27 MED ORDER — GUAIFENESIN ER 600 MG PO TB12
600.0000 mg | ORAL_TABLET | Freq: Two times a day (BID) | ORAL | Status: DC
  Administered 2023-04-27: 600 mg via ORAL
  Filled 2023-04-27 (×2): qty 1

## 2023-04-27 NOTE — Consult Note (Addendum)
 Initial Consultation Note   Patient: Allison Patel FMW:[id number] DOB: 10-03-1955 PCP: Perri Ronal PARAS, MD DOA: 04/27/2023 DOS: the patient was seen and examined on 04/27/2023 Primary service: Claudene Maximino LABOR, MD  Referring physician: Dr. Griselda Reason for consult: Admission  Assessment and Plan:   Acute respiratory failure with hypoxia SIRS/sepsis Influenza A Patient presented with tachycardia, tachypnea, and white blood cell count elevated at 19 to meet SIRS criteria.  O2 saturations were initially maintained on 3 L of nasal cannula oxygen.  Chest x-ray was noted to be limited due to low lung volumes with obscured opacified left base, and cardiomegaly with pulmonary vascular congestion.  Screening was positive for Influenza A.  Patient had been given Rocephin  IV.  However family noted that patient did not appear to be breathing and alerted staff.  Code was called with CPR initiated, patient received 1 dose of epinephrine , and was intubated with return of spontaneous circulation.  Question respiratory arrest in the setting of patient being overloaded or flash pulmonary edema.  On -Admit to a progressive bed -Continuous pulse oximetry with oxygen maintain O2 saturations greater than 92% -Incentive spirometry and flutter valve -Check blood cultures -Check urine Legionella and strep -Check procalcitonin -Continue empiric antibiotics of Rocephin  IV -Albuterol  nebs as needed -Patient not a candidate for Tamiflu due to poor kidney function not on dialysis -PCCM consulted to take over care as patient to go to ICU  Fluid overload CKD stage V, requiring dialysis Labs note CN III 0.5, BUN 95, creatinine 9.7, and BNP elevated at 1076.5.  Creatinine previously had been 8.02 when checked on 03/17/2023.  Patient has fistula in place, but had not started dialysis yet.  She had been given Lasix  80 mg IV x 1 dose. -Strict I&O's and daily weights -Nephrology consulted for need of dialysis  New onset  atrial fibrillation Acute.  Patient was initially noted to be in A-fib with RVR with heart rates into the 160 -180s en route with EMS.  Patient was cardioverted and noted to be in sinus rhythm.  Thought to likely be onset in the setting flu. CHA2DS2-VASc score equal to at least 4 based off age, gender, HTN, and DM history for which possible need of anticoagulation could be considered. -Check TSH -Goal potassium at least 4 and magnesium  at least 2. -Check echocardiogram   Elevated troponin Acute.  High-sensitivity troponins essentially flat 36-> 31 on admission.  Thought to be secondary to respiratory distress -Follow-up echocardiogram   Hypertensive urgency Acute.  Blood pressures elevated up to 214/91. -Hydralazine  IV as needed for elevated systolic blood pressure greater than 180   Acute metabolic encephalopathy Patient was noted to be acutely altered.  CT scan of the brain noted no acute intercranial abnormality with right maxillary sinus and otomastoid opacification.  Symptoms multifactorial in setting of influenza A and worsening kidney disease. -Neurochecks  Uncontrolled diabetes mellitus type 2 with hyperglycemia, with long-term use of insulin  Initial labs noted glucose elevated up to 189.  Last available hemoglobin A1c was 7.9 checked on 10/03/2022. -Hypoglycemic protocols -CBGs before every meal with very sensitive SSI   Anemia of chronic disease Chronic.  Hemoglobin noted to be 8.7 which appears around patient's baseline. -Continue to monitor   Thrombocytosis Acute.  Platelet count elevated at 422.  Thought reactive to above. -Recheck CBC tomorrow  Hypothyroidism -Check TSH -Continue levothyroxine  once able  TRH will sign off at present, please call us  again when needed.  HPI: Allison Patel is a 68  y.o. female with past medical history of  hypertension, hyperlipidemia, diabetes mellitus type 2, CKD stage V not yet on hemodialysis, hypothyroidism, and obesity who  presents after being noted to be unresponsive. EMS found patient at home on the couch unresponsive with GCS of 6 with in A-fib with RVR with heart rate in the 160-180s.  She was cardioverted by EMS into a sinus rhythm.   In the emergency department patient was noted to be afebrile with heart rates elevated into the 110s, respirations 18-31, blood pressures elevated up to 214/91, and O2 saturations currently maintained on 3 L of nasal cannula oxygen.  Labs significant for WBC 19, hemoglobin 8.7, platelets 422, potassium 3.5, CO2 13, BUN 95, creatinine 9.57, glucose 189, anion gap 18,  BNP 1076.5, high-sensitivity troponin 36->31, and lactic acid 1.6.  Chest x-ray was noted to be limited due to low lung volumes with obscured opacified left base, and cardiomegaly with pulmonary vascular congestion.    Review of Systems: unable to review all systems due to the inability of the patient to answer questions. Past Medical History:  Diagnosis Date   Anemia    Chronic kidney disease    Diabetes mellitus without complication (HCC)    [place]   Hyperlipidemia    Hypertension    Hypothyroidism    Past Surgical History:  Procedure Laterality Date   ABDOMINAL HYSTERECTOMY     AV FISTULA PLACEMENT Left 09/24/2022   Procedure: LEFT ARM ARTERIOVENOUS (AV) BRACHIOCEPHALIC FISTULA CREATION;  Surgeon: Lanis Fonda BRAVO, MD;  Location: [facility] OR;  Service: Vascular;  Laterality: Left;   CESAREAN SECTION     2   CHOLECYSTECTOMY     IR FLUORO GUIDE CV LINE RIGHT  10/21/2018   IR US  GUIDE VASC ACCESS RIGHT  10/21/2018   Social History:  reports that she has never smoked. She has never used smokeless tobacco. She reports that she does not drink alcohol  and does not use drugs.  Allergies  Allergen Reactions   Macrobid [Nitrofurantoin Macrocrystal] Rash    Family History  Problem Relation Age of Onset   Diabetes Mother    Hypertension Mother    Congestive Heart Failure Mother    Hypertension Father    Pneumonia  Father    Pancreatic cancer Brother     Prior to Admission medications   Medication Sig Start Date End Date Taking? Authorizing Provider  amLODipine  (NORVASC ) 10 MG tablet TAKE ONE TABLET BY MOUTH ONE TIME DAILY 10/14/22  Yes [name], Ronal PARAS, MD  calcitRIOL (ROCALTROL) 0.25 MCG capsule Take 0.25 mcg by mouth daily.   Yes [provider]  cloNIDine  (CATAPRES ) 0.2 MG tablet TAKE ONE TABLET BY MOUTH TWICE A DAY 04/25/22  Yes [name], Ronal PARAS, MD  levothyroxine  (SYNTHROID ) 100 MCG tablet Take 1 tablet (100 mcg total) by mouth daily. 10/04/22  Yes [name], Ronal PARAS, MD  metoprolol  tartrate (LOPRESSOR ) 25 MG tablet TAKE ONE-HALF TABLET BY MOUTH TWICE A DAY 10/14/22  Yes [name], Ronal PARAS, MD  rosuvastatin  (CRESTOR ) 40 MG tablet TAKE ONE TABLET BY MOUTH ONE TIME DAILY 03/13/23  Yes [name], Ronal PARAS, MD  Continuous Blood Gluc Receiver (DEXCOM G6 RECEIVER) DEVI Dexcom G6 Receiver misc  USE AS DIRECTED    [provider]  Continuous Blood Gluc Sensor (DEXCOM G6 SENSOR) MISC Dexcom G6 Sensor device  CHANGE SENSOR EVERY 10 DAYS    [provider]  Continuous Blood Gluc Transmit (DEXCOM G6 TRANSMITTER) MISC Dexcom G6 Transmitter device  USE AS DIRECTED  [provider]  furosemide  (LASIX ) 40 MG tablet Take 40 mg by mouth.    [provider]  Insulin  Glargine (LANTUS ) 100 UNIT/ML Solostar Pen Inject 10 Units into the skin daily at 10 pm. Patient taking differently: Inject 34 Units into the skin daily at 10 pm. 10/23/18 09/24/22  Arrien, Elidia Sieving, MD  insulin  lispro (HUMALOG  KWIKPEN) 100 UNIT/ML KwikPen Inject 0.1 mLs (10 Units total) into the skin 2 (two) times a day. For glucose of 150-200 use two units, for 201 to 250 use three units, for 251 to 300 use five units, for 301 to 350 use seven units, for 351 or greater use nine units. 10/23/18   Arrien, Elidia Sieving, MD  Insulin  Pen Needle 31G X 6 MM MISC 1 Device by Does not apply route 2 (two) times daily. 07/07/17   Odell Celinda Balo, MD  Vitamin D , Ergocalciferol , (DRISDOL ) 1.25 MG (50000 UNIT) CAPS capsule TAKE ONE CAPSULE BY MOUTH EVERY WEEK 11/08/21   Perri Ronal PARAS, MD    Physical Exam: Vitals:   04/27/23 0900 04/27/23 0909 04/27/23 0910 04/27/23 0915  BP:  (!) 188/84 (!) 199/91 (!) 146/101  Pulse: (!) 102 (!) 160 (!) 161 (!) 148  Resp: (!) 51 14 17 19   Temp:      TempSrc:      SpO2: 100% 97% 100% 99%  Weight:       Constitutional: Obese elderly female who appears ill in respiratory distress Eyes: PERRL, lids and conjunctivae normal ENMT: Mucous membranes are moist.   Neck: normal, supple  Respiratory: Tachypneic with diffuse rhonchi and crackles appreciated in both lung lung fields. Cardiovascular: Tachycardic.  Trace lower extremity swelling.  Left upper extremity fistula in place Abdomen: no tenderness, no masses palpated.  bowel sounds positive.  Musculoskeletal: no  cyanosis. No joint deformity upper and lower extremities. Normal muscle tone.  Skin: no rashes, lesions, ulcers. No induration  Data Reviewed:   Reviewed labs, imaging, and pertinent records as documented.  EKG reveals tachycardia 114 bpm with RBBB and LPFB   Family Communication:   Primary team communication:   Thank you very much for involving us  in the care of your patient.  Author: Maximino DELENA Sharps, MD 04/27/2023 9:19 AM  For on call review [email or web address].

## 2023-04-27 NOTE — ED Notes (Addendum)
 Luisa Hart EMT called to bedside by patient spouse due to change in loc and apnea. Found cyanotic with sats 50s and pulseless. CPR started immediately and staff to bedside immediately for resuscitation.

## 2023-04-27 NOTE — ED Provider Notes (Signed)
 [place] [facility] AT [facility] Provider Note   CSN: [id number] Arrival date & time: 04/27/23  0308     History  Chief Complaint  Patient presents with   Altered Mental Status   Irregular Heart Beat    Allison Patel is a 68 y.o. female.  The history is provided by the patient, the EMS personnel, the spouse and medical records.  Altered Mental Status Allison Patel is a 68 y.o. female who presents to the Emergency Department complaining of altered mental status.  She presents to the emergency department by EMS after being found on the couch unresponsive at 2 AM.  GCS of 6 at that time.  She was last known in her routine state of health at 33 PM.  Husband does report that she has had upper respiratory symptoms of since Christmas.  EMS reports A-fib with RVR with rates 160s to 180s with blood pressure in the 80s.  She was cardioverted at 200 J by EMS with heart rates in the 90s to 130s.  Has a history of CKD but is not on dialysis.     Home Medications Prior to Admission medications   Medication Sig Start Date End Date Taking? Authorizing Provider  amLODipine  (NORVASC ) 10 MG tablet TAKE ONE TABLET BY MOUTH ONE TIME DAILY 10/14/22  Yes [name], Ronal PARAS, MD  calcitRIOL (ROCALTROL) 0.25 MCG capsule Take 0.25 mcg by mouth daily.   Yes [provider]  cloNIDine  (CATAPRES ) 0.2 MG tablet TAKE ONE TABLET BY MOUTH TWICE A DAY 04/25/22  Yes [name], Ronal PARAS, MD  Continuous Blood Gluc Receiver (DEXCOM G6 RECEIVER) DEVI Dexcom G6 Receiver misc  USE AS DIRECTED    [provider]  Continuous Blood Gluc Sensor (DEXCOM G6 SENSOR) MISC Dexcom G6 Sensor device  CHANGE SENSOR EVERY 10 DAYS    [provider]  Continuous Blood Gluc Transmit (DEXCOM G6 TRANSMITTER) MISC Dexcom G6 Transmitter device  USE AS DIRECTED    [provider]  furosemide  (LASIX ) 40 MG tablet Take 40 mg by mouth.    [provider]  Insulin  Glargine (LANTUS ) 100  UNIT/ML Solostar Pen Inject 10 Units into the skin daily at 10 pm. Patient taking differently: Inject 34 Units into the skin daily at 10 pm. 10/23/18 09/24/22  Arrien, Elidia Sieving, MD  insulin  lispro (HUMALOG  KWIKPEN) 100 UNIT/ML KwikPen Inject 0.1 mLs (10 Units total) into the skin 2 (two) times a day. For glucose of 150-200 use two units, for 201 to 250 use three units, for 251 to 300 use five units, for 301 to 350 use seven units, for 351 or greater use nine units. 10/23/18   Arrien, Elidia Sieving, MD  Insulin  Pen Needle 31G X 6 MM MISC 1 Device by Does not apply route 2 (two) times daily. 07/07/17   Odell Celinda Balo, MD  levothyroxine  (SYNTHROID ) 100 MCG tablet Take 1 tablet (100 mcg total) by mouth daily. 10/04/22   Perri Ronal PARAS, MD  metoprolol  tartrate (LOPRESSOR ) 25 MG tablet TAKE ONE-HALF TABLET BY MOUTH TWICE A DAY 10/14/22  Yes [name], Ronal PARAS, MD  rosuvastatin  (CRESTOR ) 40 MG tablet TAKE ONE TABLET BY MOUTH ONE TIME DAILY 03/13/23  Yes [name], Ronal PARAS, MD  Vitamin D , Ergocalciferol , (DRISDOL ) 1.25 MG (50000 UNIT) CAPS capsule TAKE ONE CAPSULE BY MOUTH EVERY WEEK 11/08/21   [name], Ronal PARAS, MD      Allergies    Macrobid [nitrofurantoin macrocrystal]    Review of Systems  Review of Systems  Unable to perform ROS: Acuity of condition    Physical Exam Updated Vital Signs BP (!) 196/92 (BP Location: Right Arm)   Pulse (!) 111   Temp 98.2 F (36.8 C) (Oral)   Resp 20   Wt 69.5 kg   SpO2 100%   BMI 30.69 kg/m  Physical Exam Vitals and nursing note reviewed.  Constitutional:      Appearance: She is well-developed.     Comments: Drowsy but awakens to verbal stimuli  HENT:     Head: Normocephalic and atraumatic.  Cardiovascular:     Rate and Rhythm: Regular rhythm. Tachycardia present.     Heart sounds: No murmur heard. Pulmonary:     Effort: Pulmonary effort is normal. No respiratory distress.     Comments: Diffuse crackles, greatest in the left lung fields Abdominal:      Palpations: Abdomen is soft.     Tenderness: There is no abdominal tenderness. There is no guarding or rebound.  Musculoskeletal:        General: No tenderness.     Comments: Pitting edema to bilateral lower extremities with 2+ DP pulses bilaterally.  There is a fistula in the left upper extremity  Skin:    General: Skin is warm and dry.  Neurological:     Comments: Oriented to hospital, disoriented to time.  No asymmetry of facial movements.  Global weakness.  Visual fields grossly intact.  Psychiatric:     Comments: Unable to assess     ED Results / Procedures / Treatments   Labs (all labs ordered are listed, but only abnormal results are displayed) Labs Reviewed  RESP PANEL BY RT-PCR (RSV, FLU A&B, COVID)  RVPGX2 - Abnormal; Notable for the following components:      Result Value   Influenza A by PCR POSITIVE (*)    All other components within normal limits  CBC WITH DIFFERENTIAL/PLATELET - Abnormal; Notable for the following components:   WBC 19.0 (*)    RBC 3.15 (*)    Hemoglobin 8.7 (*)    HCT 27.6 (*)    RDW 17.4 (*)    Platelets 422 (*)    Neutro Abs 17.3 (*)    Lymphs Abs 0.2 (*)    Monocytes Absolute 1.1 (*)    Abs Immature Granulocytes 0.29 (*)    All other components within normal limits  COMPREHENSIVE METABOLIC PANEL - Abnormal; Notable for the following components:   CO2 13 (*)    Glucose, Bld 189 (*)    BUN 95 (*)    Creatinine, Ser 9.57 (*)    Calcium  8.0 (*)    Total Protein 6.0 (*)    Albumin 1.9 (*)    GFR, Estimated 4 (*)    Anion gap 18 (*)    All other components within normal limits  PROTIME-INR - Abnormal; Notable for the following components:   Prothrombin Time 15.3 (*)    All other components within normal limits  BRAIN NATRIURETIC PEPTIDE - Abnormal; Notable for the following components:   B Natriuretic Peptide 1,076.5 (*)    All other components within normal limits  I-STAT VENOUS BLOOD GAS, ED - Abnormal; Notable for the following  components:   pH, Ven 7.223 (*)    pCO2, Ven 18.4 (*)    pO2, Ven 62 (*)    Bicarbonate 7.6 (*)    TCO2 8 (*)    Acid-base deficit 18.0 (*)    Sodium 148 (*)  Potassium <2.0 (*)    Calcium , Ion 0.75 (*)    HCT 17.0 (*)    Hemoglobin 5.8 (*)    All other components within normal limits  I-STAT CHEM 8, ED - Abnormal; Notable for the following components:   BUN 89 (*)    Creatinine, Ser 9.80 (*)    Glucose, Bld 185 (*)    Calcium , Ion 1.03 (*)    TCO2 15 (*)    Hemoglobin 9.2 (*)    HCT 27.0 (*)    All other components within normal limits  I-STAT VENOUS BLOOD GAS, ED - Abnormal; Notable for the following components:   pCO2, Ven 32.2 (*)    pO2, Ven 50 (*)    Bicarbonate 14.3 (*)    TCO2 15 (*)    Acid-base deficit 12.0 (*)    Calcium , Ion 1.07 (*)    HCT 27.0 (*)    Hemoglobin 9.2 (*)    All other components within normal limits  TROPONIN I (HIGH SENSITIVITY) - Abnormal; Notable for the following components:   Troponin I (High Sensitivity) 36 (*)    All other components within normal limits  CBC WITH DIFFERENTIAL/PLATELET  CBG MONITORING, ED  I-STAT CG4 LACTIC ACID, ED  I-STAT CG4 LACTIC ACID, ED  I-STAT CG4 LACTIC ACID, ED  TYPE AND SCREEN  ABO/RH  TROPONIN I (HIGH SENSITIVITY)  TROPONIN I (HIGH SENSITIVITY)    EKG EKG Interpretation Date/Time:  Sunday April 27 2023 03:12:29 EST Ventricular Rate:  114 PR Interval:  136 QRS Duration:  164 QT Interval:  390 QTC Calculation: 538 R Axis:   193  Text Interpretation: Sinus tachycardia RBBB and LPFB Confirmed by Griselda Norris [phone]) on 04/27/2023 3:17:11 AM  Radiology CT Head Wo Contrast Result Date: 04/27/2023 CLINICAL DATA:  Found unresponsive, atrial fibrillation with RVR EXAM: CT HEAD WITHOUT CONTRAST TECHNIQUE: Contiguous axial images were obtained from the base of the skull through the vertex without intravenous contrast. RADIATION DOSE REDUCTION: This exam was performed according to the departmental  dose-optimization program which includes automated exposure control, adjustment of the mA and/or kV according to patient size and/or use of iterative reconstruction technique. COMPARISON:  10/06/2018 FINDINGS: Brain: No evidence of acute infarction, hemorrhage, hydrocephalus, extra-axial collection or mass lesion/mass effect. Vascular: No hyperdense vessel or unexpected calcification. Skull: No acute finding Sinuses/Orbits: Mucosal thickening with fluid level in the right maxillary sinus. Right mastoid and middle ear opacification with negative in symmetric nasopharynx. IMPRESSION: 1. No acute intracranial finding. 2. Right maxillary sinus and otomastoid opacification. Electronically Signed   By: Dorn Roulette M.D.   On: 04/27/2023 04:49   DG Chest Port 1 View Result Date: 04/27/2023 CLINICAL DATA:  Shortness of breath EXAM: PORTABLE CHEST 1 VIEW COMPARISON:  10/30/2018 FINDINGS: Cardiopericardial enlargement. Low volume chest with indistinct opacity at the bases, somewhat streaky in the right perihilar lung. Artifact from EKG leads. Artifact from defibrillator pad on the left where the lung is not visibly aerated. There is no edema, effusion, or pneumothorax. IMPRESSION: 1. Limited low volume chest with obscured or opacified left base. 2. Cardiomegaly with vascular congestion. Electronically Signed   By: Dorn Roulette M.D.   On: 04/27/2023 03:57    Procedures Procedures   CRITICAL CARE Performed by: Norris Griselda   Total critical care time: 45 minutes  Critical care time was exclusive of separately billable procedures and treating other patients.  Critical care was necessary to treat or prevent imminent or life-threatening deterioration.  Critical care was  time spent personally by me on the following activities: development of treatment plan with patient and/or surrogate as well as nursing, discussions with consultants, evaluation of patient's response to treatment, examination of patient,  obtaining history from patient or surrogate, ordering and performing treatments and interventions, ordering and review of laboratory studies, ordering and review of radiographic studies, pulse oximetry and re-evaluation of patient's condition.  Medications Ordered in ED Medications  cefTRIAXone  (ROCEPHIN ) 2 g in sodium chloride  0.9 % 100 mL IVPB (2 g Intravenous New Bag/Given 04/27/23 0535)  furosemide  (LASIX ) injection 80 mg (80 mg Intravenous Given 04/27/23 9472)    ED Course/ Medical Decision Making/ A&P                                 Medical Decision Making Amount and/or Complexity of Data Reviewed Labs: ordered. Radiology: ordered.  Risk Prescription drug management. Decision regarding hospitalization.   Patient with history of anemia, CKD here for evaluation following unresponsive episode.  She was hypotensive and in A-fib with RVR for EMS.  She did receive direct electrical cardioversion prior to ED arrival.  At time of ED arrival she is drowsy but conversant with global weakness.  She is in sinus tachycardia with hypertension.  Labs with mild worsening of her renal function.  Chest x-ray with pulmonary vascular congestion.  She is on 3 L nasal cannula.  She has dry mucous membranes but overall looks volume up.  Initial labs of i-STAT Chem-8 and VBG are inaccurate as there was saline in the line.  Repeat labs demonstrated a hemoglobin of 9, creatinine of 9.  Discussed with Dr. Gearline with nephrology given worsening renal function with pulmonary edema-okay to treat with IV Lasix , 80 mg.  At time of ED arrival patient did have 130 cc in her bladder.  Will place Foley catheter to monitor urine output.  CBC with leukocytosis, will treat empirically with antibiotics for possible pneumonia although this is not completely clear at this time.  Her lactic acid is within normal limits.  She is positive for influenza A.  BNP is elevated.  Troponin is mildly elevated.  EKG with sinus tach.  Discussed  with on-call cardiologist, Dr.Narcisse -okay to not start on heparin  at this time as patient is no longer in A-fib.  Medicine can place a formal cardiology consult as needed.  Hospitalist consulted for admission for ongoing care.  Patient and husband updated Priscille studies and they are in agreement with treatment plan.        Final Clinical Impression(s) / ED Diagnoses Final diagnoses:  Influenza  Acute pulmonary edema [facility])    Rx / DC Orders ED Discharge Orders     None         Griselda Norris, MD 04/27/23 [phone]

## 2023-04-27 NOTE — ED Triage Notes (Signed)
 Pt BIB EMS from home on couch, found unresponsive (GCS 6)  in A-fib w/ RVR with rate in 160's-180's. Pt cardioverted by EMS, still in a-fib 90-132's..  Pt LKW per EMS was 04/26/23 around 8-9pm. No known history of a-fib.  PMHx ESRD

## 2023-04-27 NOTE — ED Provider Notes (Signed)
  Physical Exam  BP (!) 146/101   Pulse (!) 148   Temp 98.2 F (36.8 C) (Oral)   Resp 19   Wt 69.5 kg   SpO2 99%   BMI 30.69 kg/m   Physical Exam  Procedures  Procedure Name: Intubation Date/Time: 04/27/2023 9:30 AM  Performed by: Dean Clarity, MDOxygen Delivery Method: Ambu bag Preoxygenation: Pre-oxygenation with 100% oxygen Induction Type: Rapid sequence Ventilation: Mask ventilation with difficulty and Two handed mask ventilation required Laryngoscope Size: Glidescope and 3 Tube size: 7.5 mm Number of attempts: 1 Placement Confirmation: ETT inserted through vocal cords under direct vision, Breath sounds checked- equal and bilateral and Positive ETCO2 Secured at: 22 cm Tube secured with: ETT holder Dental Injury: Teeth and Oropharynx as per pre-operative assessment  Difficulty Due To: Difficulty was anticipated Future Recommendations: Recommend- induction with short-acting agent, and alternative techniques readily available    CPR  Date/Time: 04/27/2023 9:31 AM  Performed by: Dean Clarity, MD Authorized by: Dean Clarity, MD  CPR Procedure Details:      Amount of time prior to administration of ACLS/BLS (minutes):  1   CPR/ACLS performed in the ED: Yes     Duration of CPR (minutes):  5   Outcome: ROSC obtained    CPR performed via ACLS guidelines under my direct supervision.  See RN documentation for details including defibrillator use, medications, doses and timing.   ED Course / MDM    Medical Decision Making Amount and/or Complexity of Data Reviewed Labs: ordered. Radiology: ordered.  Risk OTC drugs. Prescription drug management. Decision regarding hospitalization.   Pt was awaiting a bed for admission when the family's husband came out and told the nurse tech that she was not breathing.  The nurse tech went in and found pt to be apneic and pulseless.  Code Blue called.  Pt bagged, CPR done, 1 dose epi given.  Pt intubated.  HR back.  Propofol   started for sedation.  Dr. Claudene notified.  I did discuss event with her husband.  He said she would not want CPR again if her heart stops.  Dr. Claudene will notify ICU.  CRITICAL CARE Performed by: Clarity Dean   Total critical care time: 30 minutes  Critical care time was exclusive of separately billable procedures and treating other patients.  Critical care was necessary to treat or prevent imminent or life-threatening deterioration.  Critical care was time spent personally by me on the following activities: development of treatment plan with patient and/or surrogate as well as nursing, discussions with consultants, evaluation of patient's response to treatment, examination of patient, obtaining history from patient or surrogate, ordering and performing treatments and interventions, ordering and review of laboratory studies, ordering and review of radiographic studies, pulse oximetry and re-evaluation of patient's condition.        Dean Clarity, MD 04/27/23 [phone]

## 2023-04-27 NOTE — Progress Notes (Signed)
   04/27/23 1106  Spiritual Encounters  Type of Visit Initial  Care provided to: [facility] partners present during encounter Nurse  Referral source Nurse (RN/NT/LPN)  Reason for visit Code  OnCall Visit Yes   Chaplain responded to a code blue in the ED. Chaplain met with patient's husband in the waiting room. Patient's husband reported being a diabetic, having a large headache, and a fear that his sugar may be high. Chaplain relayed this to patient's RN and we gave him water  and some food to help him maintain sugar levels. Informed husband that he would need to check in as a patient if he wanted more medical care, which he declined. Chaplain offered prayer and support.  At husband's request, I passed along information for patient's brother Charolotte How, [phone]) so a physician can speak to him. Alm and his family live in [place] . Physician did speak to patient's brother, and they are looking for a flight to [place] later tonight or tomorrow morning.   Patient's daughter also arrived to the family waiting room. Chaplain assisted with security and nursing staff to help facilitate medical consultation. There also appears to be some difficult deliberations within the family regarding code status.   Chaplain introduced spiritual care services. Spiritual care services available as needed.   Juliene CHRISTELLA Das, Chaplain 04/27/23

## 2023-04-27 NOTE — H&P (Addendum)
 NAME:  Allison Patel, MRN:  [id number], DOB:  1955/11/06, LOS: 0 ADMISSION DATE:  04/27/2023, CONSULTATION DATE:  04/27/2023 REFERRING MD:  Dr. Claudene TRH, CHIEF COMPLAINT:  respiratory failure, cardiac arrest   History of Present Illness:  Allison Patel is a 68 y.o. woman with past medical history of CKD Stage IV who presents from home with decreased level of consciousness. History obtained from her daughter as the patient is currently intubated and sedated.  Probably started having URI symptoms very suspicious for flare on Christmas time.  Both patient and her husband were sick.  Was not completely better, decreased oral intake, lethargic last night.  Last night the patient's husband was awoken by the dog who was making a lot of noise he went to go check on his wife and found her breathing agonal he and not responding.  EMS was called.  Was brought to the ED and found to be hypoxemic when A-fib with RVR.  Influenza A positive.  While being admitted to the hospital medicine team underwent respiratory mediated cardiac arrest.  Was intubated and sedated.  She is not currently on any vasopressor support.  Of note the daughter says that the patient has not been good at taking care of herself.  She does not like when the doctors.  She does go to her kidney appointments, but per the daughter was not doing everything she needed to be doing for pretransplant evaluation.  The patient also did not want aggressive medical care including resuscitation or intubation, but she thinks she might have been okay with it for a time-limited trial.  After seeing her undergo cardiac arrest in the ED the family did make her DNR.  PCCM is being consulted for admission to the hospital. Pertinent  Medical History  DM2 CKD Stage IV HTN Obesity   Significant Hospital Events: Including procedures, antibiotic start and stop dates in addition to other pertinent events     Interim History / Subjective:    Objective   Blood  pressure (!) 169/96, pulse (!) 124, temperature 98.8 F (37.1 C), resp. rate (!) 36, height 4' 11 (1.499 m), weight 69.5 kg, SpO2 100%.    Vent Mode: PRVC FiO2 (%):  [60 %-100 %] 60 % Set Rate:  [22 bmp] 22 bmp Vt Set:  [350 mL-500 mL] 350 mL PEEP:  [5 cmH20] 5 cmH20 Plateau Pressure:  [21 cmH20] 21 cmH20  No intake or output data in the 24 hours ending 04/27/23 1114 Filed Weights   04/27/23 0319  Weight: 69.5 kg    Examination: Gen:      Intubated, sedated, acutely and chronically ill appearing HEENT:  ETT to vent Lungs:    sounds of mechanical ventilation auscultated no wheeze CV:         tachycardic, regular, systolic murmur present Abd:      + bowel sounds; soft, non-tender; no palpable masses, obese Ext:    No lower extremity edema, LUE AVF +thrill and bruit Skin:      Warm and dry; no rashes Neuro:   sedated, RASS -4  Labs/Imaging personally reviewed Chest xray shows low lung volumes, ETT in place, right horizontal fissure linear atelectasis, cardiomegaly  Na 136 K 3.5 Cr 9.57 BUN 95 Bicarb 13 AG 18 BNP elevated  Resolved Hospital Problem list     Assessment & Plan:   Acute hypoxemic respiratory failure Influenza A Pneumonia Volume overload - lung protective ventilation - respiratory culture, tracheal aspirate - blood cultures pending -  empiric abx with concern for bacterial superinfection   Cardiac arrest - likely mediated by hypoxemia from above Atrial fibrillation Hypertrophic Cardiomyopathy HTN - echo ordered and pending - monitor electrolytes and replace cautiously in setting of CKD - continue home meds for now as no evidence of shock  AKI on CKD Stage IV S/p AVF placement Anion Gap metabolic acidosis - likely from azotemia - nephrology consultation for impending renal failure - monitor electrolytes  Type 2 DM with insulin  dependence - Lantus  reduced dose, ISS - A1C pending    Best Practice (right click and Reselect all SmartList  Selections daily)   Diet/type: NPO w/ meds via tube DVT prophylaxis prophylactic heparin   Pressure ulcer(s): pressure ulcer assessment deferred  GI prophylaxis: H2B Lines: N/A Foley:  Yes, and it is still needed Code Status:  DNR Last date of multidisciplinary goals of care discussion [daughter Allison Patel updated at bedside in ED. Verified code status. She is aware this is a critical situation and potentially end of life. Asking for time for family to gather from out of state before making any decisions.]  Labs   CBC: Recent Labs  Lab 04/27/23 0323 04/27/23 0342 04/27/23 0407  WBC  --   --  19.0*  NEUTROABS  --   --  17.3*  HGB 5.8* 9.2*  9.2* 8.7*  HCT 17.0* 27.0*  27.0* 27.6*  MCV  --   --  87.6  PLT  --   --  422*    Basic Metabolic Panel: Recent Labs  Lab 04/27/23 0323 04/27/23 0342 04/27/23 0407  NA 148* 137  137 136  K <2.0* 3.5  3.5 3.5  CL  --  107 105  CO2  --   --  13*  GLUCOSE  --  185* 189*  BUN  --  89* 95*  CREATININE  --  9.80* 9.57*  CALCIUM   --   --  8.0*   GFR: Estimated Creatinine Clearance: 4.8 mL/min (A) (by C-G formula based on SCr of 9.57 mg/dL (H)). Recent Labs  Lab 04/27/23 0326 04/27/23 0342 04/27/23 0407 04/27/23 0619  WBC  --   --  19.0*  --   LATICACIDVEN 1.0 1.6  --  0.6    Liver Function Tests: Recent Labs  Lab 04/27/23 0407  AST 16  ALT 11  ALKPHOS 67  BILITOT 0.6  PROT 6.0*  ALBUMIN 1.9*   No results for input(s): LIPASE, AMYLASE in the last 168 hours. No results for input(s): AMMONIA in the last 168 hours.  ABG    Component Value Date/Time   HCO3 14.3 (L) 04/27/2023 0342   TCO2 15 (L) 04/27/2023 0342   TCO2 15 (L) 04/27/2023 0342   ACIDBASEDEF 12.0 (H) 04/27/2023 0342   O2SAT 80 04/27/2023 0342     Coagulation Profile: Recent Labs  Lab 04/27/23 0407  INR 1.2    Cardiac Enzymes: No results for input(s): CKTOTAL, CKMB, CKMBINDEX, TROPONINI in the last 168 hours.  HbA1C: Hemoglobin  A1C  Date/Time Value Ref Range Status  05/20/2022 12:00 AM 8.1  Final  06/26/2021 12:00 AM 11.8  Final   Hgb A1c MFr Bld  Date/Time Value Ref Range Status  10/03/2022 09:16 AM 7.9 (H) <5.7 % of total Hgb Final    Comment:    For someone without known diabetes, a hemoglobin A1c value of 6.5% or greater indicates that they may have  diabetes and this should be confirmed with a follow-up  test. . For someone with known diabetes,  a value <7% indicates  that their diabetes is well controlled and a value  greater than or equal to 7% indicates suboptimal  control. A1c targets should be individualized based on  duration of diabetes, age, comorbid conditions, and  other considerations. . Currently, no consensus exists regarding use of hemoglobin A1c for diagnosis of diabetes for children. .     CBG: No results for input(s): GLUCAP in the last 168 hours.  Review of Systems:   Unable to obtain due to patient condition  Past Medical History:  She,  has a past medical history of Anemia, Chronic kidney disease, Diabetes mellitus without complication (HCC), Hyperlipidemia, Hypertension, and Hypothyroidism.   Surgical History:   Past Surgical History:  Procedure Laterality Date   ABDOMINAL HYSTERECTOMY     AV FISTULA PLACEMENT Left 09/24/2022   Procedure: LEFT ARM ARTERIOVENOUS (AV) BRACHIOCEPHALIC FISTULA CREATION;  Surgeon: Lanis Fonda BRAVO, MD;  Location: [facility] OR;  Service: Vascular;  Laterality: Left;   CESAREAN SECTION     2   CHOLECYSTECTOMY     IR FLUORO GUIDE CV LINE RIGHT  10/21/2018   IR US  GUIDE VASC ACCESS RIGHT  10/21/2018     Social History:   reports that she has never smoked. She has never used smokeless tobacco. She reports that she does not drink alcohol  and does not use drugs.   Family History:  Her family history includes Congestive Heart Failure in her mother; Diabetes in her mother; Hypertension in her father and mother; Pancreatic cancer in her brother;  Pneumonia in her father.   Allergies Allergies  Allergen Reactions   Macrobid [Nitrofurantoin Macrocrystal] Rash     Home Medications  Prior to Admission medications   Medication Sig Start Date End Date Taking? Authorizing Provider  amLODipine  (NORVASC ) 10 MG tablet TAKE ONE TABLET BY MOUTH ONE TIME DAILY 10/14/22  Yes [name], Ronal PARAS, MD  calcitRIOL (ROCALTROL) 0.25 MCG capsule Take 0.25 mcg by mouth daily.   Yes [provider]  cloNIDine  (CATAPRES ) 0.2 MG tablet TAKE ONE TABLET BY MOUTH TWICE A DAY 04/25/22  Yes [name], Ronal PARAS, MD  levothyroxine  (SYNTHROID ) 100 MCG tablet Take 1 tablet (100 mcg total) by mouth daily. 10/04/22  Yes [name], Ronal PARAS, MD  metoprolol  tartrate (LOPRESSOR ) 25 MG tablet TAKE ONE-HALF TABLET BY MOUTH TWICE A DAY 10/14/22  Yes [name], Ronal PARAS, MD  rosuvastatin  (CRESTOR ) 40 MG tablet TAKE ONE TABLET BY MOUTH ONE TIME DAILY 03/13/23  Yes [name], Ronal PARAS, MD  Continuous Blood Gluc Receiver (DEXCOM G6 RECEIVER) DEVI Dexcom G6 Receiver misc  USE AS DIRECTED    [provider]  Continuous Blood Gluc Sensor (DEXCOM G6 SENSOR) MISC Dexcom G6 Sensor device  CHANGE SENSOR EVERY 10 DAYS    [provider]  Continuous Blood Gluc Transmit (DEXCOM G6 TRANSMITTER) MISC Dexcom G6 Transmitter device  USE AS DIRECTED    [provider]  furosemide  (LASIX ) 40 MG tablet Take 40 mg by mouth.    [provider]  Insulin  Glargine (LANTUS ) 100 UNIT/ML Solostar Pen Inject 10 Units into the skin daily at 10 pm. Patient taking differently: Inject 34 Units into the skin daily at 10 pm. 10/23/18 09/24/22  Arrien, Elidia Sieving, MD  insulin  lispro (HUMALOG  KWIKPEN) 100 UNIT/ML KwikPen Inject 0.1 mLs (10 Units total) into the skin 2 (two) times a day. For glucose of 150-200 use two units, for 201 to 250 use three units, for 251 to 300 use five units, for 301 to  350 use seven units, for 351 or greater use nine units. 10/23/18   Arrien, Elidia Sieving, MD   Insulin  Pen Needle 31G X 6 MM MISC 1 Device by Does not apply route 2 (two) times daily. 07/07/17   Odell Celinda Balo, MD  Vitamin D , Ergocalciferol , (DRISDOL ) 1.25 MG (50000 UNIT) CAPS capsule TAKE ONE CAPSULE BY MOUTH EVERY WEEK 11/08/21   Perri Ronal PARAS, MD     Critical care time:      The patient is critically ill due to respiratory failure, cardiopulmonary arrest.  Critical care was necessary to treat or prevent imminent or life-threatening deterioration.  Critical care was time spent personally by me on the following activities: development of treatment plan with patient and/or surrogate as well as nursing, discussions with consultants, evaluation of patient's response to treatment, examination of patient, obtaining history from patient or surrogate, ordering and performing treatments and interventions, ordering and review of laboratory studies, ordering and review of radiographic studies, pulse oximetry, re-evaluation of patient's condition and participation in multidisciplinary rounds.   Critical Care Time devoted to patient care services described in this note is 50 minutes. This time reflects time of care of this signee Luane Rochon S Boaz Berisha . This critical care time does not reflect separately billable procedures or procedure time, teaching time or supervisory time of PA/NP/Med student/Med Resident etc but could involve care discussion time.       Verdon GORMAN [facility] 04/27/2023 11:14 AM  Pager: see AMION  If no response to pager , please call critical care on call (see AMION) until 7pm After 7:00 pm call Elink

## 2023-04-27 NOTE — Consult Note (Signed)
 [facility]  HISTORY AND PHYSICAL  Allison Patel is an 68 y.o. female.    Chief Complaint: unresponsive at home  HPI: Pt is a 39F with PMH sig for HTN, HLD, DM II, history of severe COVID infection requiring dialysis in 2020, advanced CKD, who is now seen in consultation at the request of Dr. Meade for eval and recs re: AKI and vol overload.  Pt initially was brought in by EMS after her dog and husband found her unresponsive.  She had had several days of not feeling well with URI symptoms- started around Christmas.  Brought in by EMS, noted to be hypoxemic and in Afib with RVR.  Flu A +.    Was originally admitted to the hospitalist service but while waiting for a bed had resp and cardiac arrest.  Received CPR and ACLS and achieved ROSC.    Dtr at bedside.  She says that her mom has been pretty lukewarm about aggressive medical intervention for her kidneys.  She has an AVF and they were talking about kidney transplant but she was not actively pursuing all recommendations.  Family has already asked that pt be a DNR.    PMH: Past Medical History:  Diagnosis Date   Anemia    Chronic kidney disease    Diabetes mellitus without complication (HCC)    typell   Hyperlipidemia    Hypertension    Hypothyroidism    PSH: Past Surgical History:  Procedure Laterality Date   ABDOMINAL HYSTERECTOMY     AV FISTULA PLACEMENT Left 09/24/2022   Procedure: LEFT ARM ARTERIOVENOUS (AV) BRACHIOCEPHALIC FISTULA CREATION;  Surgeon: Lanis Fonda BRAVO, MD;  Location: [facility] OR;  Service: Vascular;  Laterality: Left;   CESAREAN SECTION     2   CHOLECYSTECTOMY     IR FLUORO GUIDE CV LINE RIGHT  10/21/2018   IR US  GUIDE VASC ACCESS RIGHT  10/21/2018    Past Medical History:  Diagnosis Date   Anemia    Chronic kidney disease    Diabetes mellitus without complication (HCC)    typell   Hyperlipidemia    Hypertension    Hypothyroidism     Medications:  Scheduled:  amLODipine   10 mg Per Tube  Daily   cloNIDine   0.2 mg Per Tube BID   docusate  100 mg Per Tube BID   guaiFENesin   600 mg Oral BID   heparin   5,000 Units Subcutaneous Q8H   insulin  aspart  0-6 Units Subcutaneous TID WC   insulin  glargine-yfgn  15 Units Subcutaneous QHS   [START ON 04/28/2023] levothyroxine   100 mcg Per Tube Q0600   pantoprazole  (PROTONIX ) IV  40 mg Intravenous Q24H   polyethylene glycol  17 g Per Tube Daily   [START ON 04/28/2023] rosuvastatin   40 mg Per Tube Daily   sodium chloride  flush  3 mL Intravenous Q12H    (Not in a hospital admission)   ALLERGIES:   Allergies  Allergen Reactions   Macrobid [Nitrofurantoin Macrocrystal] Rash    FAM HX: Family History  Problem Relation Age of Onset   Diabetes Mother    Hypertension Mother    Congestive Heart Failure Mother    Hypertension Father    Pneumonia Father    Pancreatic cancer Brother     Social History:   reports that she has never smoked. She has never used smokeless tobacco. She reports that she does not drink alcohol  and does not use drugs.  ROS: ROS: unable to perform, intubated  and sedated  Blood pressure (!) 148/76, pulse 95, temperature 99.4 F (37.4 C), resp. rate (!) 23, height 4' 11 (1.499 m), weight 69.5 kg, SpO2 100%. PHYSICAL EXAM: Physical Exam GEN appears ill, lying in bed HEENT eyes closed NECK + JVD PULM mech bilaterally CV tachycardic ABD soft EXT no LE edema NEURO intubated, sedated SKIN dry   Results for orders placed or performed during the hospital encounter of 04/27/23 (from the past 48 hours)  Type and screen [facility]     Status: None   Collection Time: 04/27/23  3:20 AM  Result Value Ref Range   ABO/RH(D) A POS    Antibody Screen NEG    Sample Expiration      04/30/2023,2359 Performed at [facility], [address], [place], [place] [address]   I-Stat venous blood gas, ED     Status: Abnormal   Collection Time: 04/27/23  3:23 AM  Result Value Ref Range   pH, Ven  7.223 (L) 7.25 - 7.43   pCO2, Ven 18.4 (LL) 44 - 60 mmHg   pO2, Ven 62 (H) 32 - 45 mmHg   Bicarbonate 7.6 (L) 20.0 - 28.0 mmol/L   TCO2 8 (L) 22 - 32 mmol/L   O2 Saturation 87 %   Acid-base deficit 18.0 (H) 0.0 - 2.0 mmol/L   Sodium 148 (H) 135 - 145 mmol/L   Potassium <2.0 (LL) 3.5 - 5.1 mmol/L   Calcium , Ion 0.75 (LL) 1.15 - 1.40 mmol/L   HCT 17.0 (L) 36.0 - 46.0 %   Hemoglobin 5.8 (LL) 12.0 - 15.0 g/dL   Sample type VENOUS    Comment NOTIFIED PHYSICIAN   I-Stat Lactic Acid     Status: None   Collection Time: 04/27/23  3:26 AM  Result Value Ref Range   Lactic Acid, Venous 1.0 0.5 - 1.9 mmol/L  Resp panel by RT-PCR (RSV, Flu A&B, Covid) Anterior Nasal Swab     Status: Abnormal   Collection Time: 04/27/23  3:26 AM   Specimen: Anterior Nasal Swab  Result Value Ref Range   SARS Coronavirus 2 by RT PCR NEGATIVE NEGATIVE   Influenza A by PCR POSITIVE (A) NEGATIVE   Influenza B by PCR NEGATIVE NEGATIVE    Comment: (NOTE) The Xpert Xpress SARS-CoV-2/FLU/RSV plus assay is intended as an aid in the diagnosis of influenza from Nasopharyngeal swab specimens and should not be used as a sole basis for treatment. Nasal washings and aspirates are unacceptable for Xpert Xpress SARS-CoV-2/FLU/RSV testing.  Fact Sheet for Patients: bloggercourse.com  Fact Sheet for Healthcare Providers: seriousbroker.it  This test is not yet approved or cleared by the [place]  FDA and has been authorized for detection and/or diagnosis of SARS-CoV-2 by FDA under an Emergency Use Authorization (EUA). This EUA will remain in effect (meaning this test can be used) for the duration of the COVID-19 declaration under Section 564(b)(1) of the Act, 21 U.S.C. section 360bbb-3(b)(1), unless the authorization is terminated or revoked.     Resp Syncytial Virus by PCR NEGATIVE NEGATIVE    Comment: (NOTE) Fact Sheet for  Patients: bloggercourse.com  Fact Sheet for Healthcare Providers: seriousbroker.it  This test is not yet approved or cleared by the [place]  FDA and has been authorized for detection and/or diagnosis of SARS-CoV-2 by FDA under an Emergency Use Authorization (EUA). This EUA will remain in effect (meaning this test can be used) for the duration of the COVID-19 declaration under Section 564(b)(1) of the Act,  21 U.S.C. section 360bbb-3(b)(1), unless the authorization is terminated or revoked.  Performed at [facility], [address] [place] [address], [place], [place] [address]   ABO/Rh     Status: None   Collection Time: 04/27/23  3:35 AM  Result Value Ref Range   ABO/RH(D)      A POS Performed at [facility], [address], [place], [place] [address]   I-stat chem 8, ED     Status: Abnormal   Collection Time: 04/27/23  3:42 AM  Result Value Ref Range   Sodium 137 135 - 145 mmol/L   Potassium 3.5 3.5 - 5.1 mmol/L   Chloride 107 98 - 111 mmol/L   BUN 89 (H) 8 - 23 mg/dL   Creatinine, Ser 0.19 (H) 0.44 - 1.00 mg/dL   Glucose, Bld 814 (H) 70 - 99 mg/dL    Comment: Glucose reference range applies only to samples taken after fasting for at least 8 hours.   Calcium , Ion 1.03 (L) 1.15 - 1.40 mmol/L   TCO2 15 (L) 22 - 32 mmol/L   Hemoglobin 9.2 (L) 12.0 - 15.0 g/dL   HCT 72.9 (L) 63.9 - 53.9 %  I-Stat venous blood gas, ED     Status: Abnormal   Collection Time: 04/27/23  3:42 AM  Result Value Ref Range   pH, Ven 7.255 7.25 - 7.43   pCO2, Ven 32.2 (L) 44 - 60 mmHg   pO2, Ven 50 (H) 32 - 45 mmHg   Bicarbonate 14.3 (L) 20.0 - 28.0 mmol/L   TCO2 15 (L) 22 - 32 mmol/L   O2 Saturation 80 %   Acid-base deficit 12.0 (H) 0.0 - 2.0 mmol/L   Sodium 137 135 - 145 mmol/L   Potassium 3.5 3.5 - 5.1 mmol/L   Calcium , Ion 1.07 (L) 1.15 - 1.40 mmol/L   HCT 27.0 (L) 36.0 - 46.0 %   Hemoglobin 9.2 (L) 12.0 - 15.0 g/dL   Sample type VENOUS    I-Stat CG4 Lactic Acid, ED     Status: None   Collection Time: 04/27/23  3:42 AM  Result Value Ref Range   Lactic Acid, Venous 1.6 0.5 - 1.9 mmol/L  Brain natriuretic peptide     Status: Abnormal   Collection Time: 04/27/23  3:53 AM  Result Value Ref Range   B Natriuretic Peptide 1,076.5 (H) 0.0 - 100.0 pg/mL    Comment: Performed at [facility], [address] [place], [place] [address]  CBC with Differential     Status: Abnormal   Collection Time: 04/27/23  4:07 AM  Result Value Ref Range   WBC 19.0 (H) 4.0 - 10.5 K/uL   RBC 3.15 (L) 3.87 - 5.11 MIL/uL   Hemoglobin 8.7 (L) 12.0 - 15.0 g/dL   HCT 72.3 (L) 63.9 - 53.9 %   MCV 87.6 80.0 - 100.0 fL   MCH 27.6 26.0 - 34.0 pg   MCHC 31.5 30.0 - 36.0 g/dL   RDW 82.5 (H) 88.4 - 84.4 %   Platelets 422 (H) 150 - 400 K/uL   nRBC 0.0 0.0 - 0.2 %   Neutrophils Relative % 91 %   Neutro Abs 17.3 (H) 1.7 - 7.7 K/uL   Lymphocytes Relative 1 %   Lymphs Abs 0.2 (L) 0.7 - 4.0 K/uL   Monocytes Relative 6 %   Monocytes Absolute 1.1 (H) 0.1 - 1.0 K/uL   Eosinophils Relative 0 %   Eosinophils Absolute 0.1 0.0 - 0.5  K/uL   Basophils Relative 0 %   Basophils Absolute 0.1 0.0 - 0.1 K/uL   Immature Granulocytes 2 %   Abs Immature Granulocytes 0.29 (H) 0.00 - 0.07 K/uL    Comment: Performed at [facility], [address] [place], [place] [address]  Comprehensive metabolic panel     Status: Abnormal   Collection Time: 04/27/23  4:07 AM  Result Value Ref Range   Sodium 136 135 - 145 mmol/L   Potassium 3.5 3.5 - 5.1 mmol/L   Chloride 105 98 - 111 mmol/L   CO2 13 (L) 22 - 32 mmol/L   Glucose, Bld 189 (H) 70 - 99 mg/dL    Comment: Glucose reference range applies only to samples taken after fasting for at least 8 hours.   BUN 95 (H) 8 - 23 mg/dL   Creatinine, Ser 0.42 (H) 0.44 - 1.00 mg/dL   Calcium  8.0 (L) 8.9 - 10.3 mg/dL   Total Protein 6.0 (L) 6.5 - 8.1 g/dL   Albumin 1.9 (L) 3.5 - 5.0 g/dL   AST 16 15 - 41 U/L   ALT 11 0 - 44  U/L   Alkaline Phosphatase 67 38 - 126 U/L   Total Bilirubin 0.6 0.0 - 1.2 mg/dL   GFR, Estimated 4 (L) >60 mL/min    Comment: (NOTE) Calculated using the CKD-EPI Creatinine Equation (2021)    Anion gap 18 (H) 5 - 15    Comment: Performed at [facility], [address], [place], [place] [address]  Troponin I (High Sensitivity)     Status: Abnormal   Collection Time: 04/27/23  4:07 AM  Result Value Ref Range   Troponin I (High Sensitivity) 36 (H) <18 ng/L    Comment: (NOTE) Elevated high sensitivity troponin I (hsTnI) values and significant  changes across serial measurements may suggest ACS but many other  chronic and acute conditions are known to elevate hsTnI results.  Refer to the Links section for chest pain algorithms and additional  guidance. Performed at [facility], [address], [place], [place] [address]   Protime-INR     Status: Abnormal   Collection Time: 04/27/23  4:07 AM  Result Value Ref Range   Prothrombin Time 15.3 (H) 11.4 - 15.2 seconds   INR 1.2 0.8 - 1.2    Comment: (NOTE) INR goal varies based on device and disease states. Performed at [facility], [address] [place], [place] [address]   Troponin I (High Sensitivity)     Status: Abnormal   Collection Time: 04/27/23  6:15 AM  Result Value Ref Range   Troponin I (High Sensitivity) 31 (H) <18 ng/L    Comment: (NOTE) Elevated high sensitivity troponin I (hsTnI) values and significant  changes across serial measurements may suggest ACS but many other  chronic and acute conditions are known to elevate hsTnI results.  Refer to the Links section for chest pain algorithms and additional  guidance. Performed at [facility], [address], [place], [place] [address]   I-Stat Lactic Acid     Status: None   Collection Time: 04/27/23  6:19 AM  Result Value Ref Range   Lactic Acid, Venous 0.6 0.5 - 1.9 mmol/L  TSH     Status: None   Collection Time: 04/27/23  8:01 AM   Result Value Ref Range   TSH 3.037 0.350 - 4.500 uIU/mL    Comment: Performed by a 3rd Generation assay with  a functional sensitivity of <=0.01 uIU/mL. Performed at [facility], [address], [place], [place] [address]   Magnesium      Status: None   Collection Time: 04/27/23 12:09 PM  Result Value Ref Range   Magnesium  2.0 1.7 - 2.4 mg/dL    Comment: HEMOLYSIS AT THIS LEVEL MAY AFFECT RESULT Performed at [facility], [address], [place], [place] [address]     DG Chest Portable 1 View Result Date: 04/27/2023 CLINICAL DATA:  68 year old female status post intubation. EXAM: PORTABLE CHEST 1 VIEW COMPARISON:  Chest x-ray 04/27/2023. FINDINGS: An endotracheal tube is in place with tip 2.7 cm above the carina. Nasogastric tube noted with tip near the gastroesophageal junction and side port in the distal third of the esophagus. Transcutaneous defibrillator pads project over the lower left hemithorax. Increasing airspace consolidation in the inferior aspect of the right upper lobe. Opacity at the left base which may reflect atelectasis and/or consolidation. Small left pleural effusion. No definite right pleural effusion. Diffuse interstitial prominence and peribronchial cuffing. No pneumothorax. No evidence of pulmonary edema. Heart size appears borderline enlarged, likely accentuated by low lung volumes, portable AP technique and patient's rotation to the left which also distorts mediastinal contours. IMPRESSION: 1. Support apparatus, as above. Advancement of the nasogastric tube approximately 15 cm for more optimal placement is recommended. 2. Persistent atelectasis and/or consolidation in the left lower lobe with small left pleural effusion. 3. Increasing airspace consolidation in the inferior right upper lobe indicative of pneumonia. 4. Background of interstitial prominence and peribronchial cuffing suggestive of underlying bronchitis. Electronically Signed   By: Toribio Aye M.D.    On: 04/27/2023 09:46   CT Head Wo Contrast Result Date: 04/27/2023 CLINICAL DATA:  Found unresponsive, atrial fibrillation with RVR EXAM: CT HEAD WITHOUT CONTRAST TECHNIQUE: Contiguous axial images were obtained from the base of the skull through the vertex without intravenous contrast. RADIATION DOSE REDUCTION: This exam was performed according to the departmental dose-optimization program which includes automated exposure control, adjustment of the mA and/or kV according to patient size and/or use of iterative reconstruction technique. COMPARISON:  10/06/2018 FINDINGS: Brain: No evidence of acute infarction, hemorrhage, hydrocephalus, extra-axial collection or mass lesion/mass effect. Vascular: No hyperdense vessel or unexpected calcification. Skull: No acute finding Sinuses/Orbits: Mucosal thickening with fluid level in the right maxillary sinus. Right mastoid and middle ear opacification with negative in symmetric nasopharynx. IMPRESSION: 1. No acute intracranial finding. 2. Right maxillary sinus and otomastoid opacification. Electronically Signed   By: Dorn Roulette M.D.   On: 04/27/2023 04:49   DG Chest Port 1 View Result Date: 04/27/2023 CLINICAL DATA:  Shortness of breath EXAM: PORTABLE CHEST 1 VIEW COMPARISON:  10/30/2018 FINDINGS: Cardiopericardial enlargement. Low volume chest with indistinct opacity at the bases, somewhat streaky in the right perihilar lung. Artifact from EKG leads. Artifact from defibrillator pad on the left where the lung is not visibly aerated. There is no edema, effusion, or pneumothorax. IMPRESSION: 1. Limited low volume chest with obscured or opacified left base. 2. Cardiomegaly with vascular congestion. Electronically Signed   By: Dorn Roulette M.D.   On: 04/27/2023 03:57    Assessment/Plan  AKI on CKD V: - has advanced CKD - has AVF and was talking about transplant, but pt's dtr does not think that this is what she would want  - no hard indication for dialysis  yet- dtr does not truly think she would want that- will talk to her father and sister  about plans moving forward - got 80 IV Lasix  in the ED follow UOP  2.  Acute hypoxic RF:  - resulting in cardiac arrest  - intubated  - flu A +  - on antibiotics for superimposed PNA  3.  Cardiac arrest  - as above  - TTE  4.  DM II  - per primary  5.  Dispo: to ICU  Noell Lorensen 04/27/2023, 1:32 PM

## 2023-04-28 ENCOUNTER — Inpatient Hospital Stay (HOSPITAL_COMMUNITY): Payer: Medicare HMO

## 2023-04-28 ENCOUNTER — Telehealth: Payer: Self-pay | Admitting: Internal Medicine

## 2023-04-28 ENCOUNTER — Encounter (HOSPITAL_COMMUNITY): Payer: Medicare HMO

## 2023-04-28 DIAGNOSIS — I469 Cardiac arrest, cause unspecified: Secondary | ICD-10-CM

## 2023-04-28 DIAGNOSIS — I4891 Unspecified atrial fibrillation: Secondary | ICD-10-CM | POA: Diagnosis not present

## 2023-04-28 DIAGNOSIS — J9601 Acute respiratory failure with hypoxia: Secondary | ICD-10-CM | POA: Diagnosis not present

## 2023-04-28 LAB — CBC
HCT: 24.7 % — ABNORMAL LOW (ref 36.0–46.0)
Hemoglobin: 7.7 g/dL — ABNORMAL LOW (ref 12.0–15.0)
MCH: 28 pg (ref 26.0–34.0)
MCHC: 31.2 g/dL (ref 30.0–36.0)
MCV: 89.8 fL (ref 80.0–100.0)
Platelets: 331 10*3/uL (ref 150–400)
RBC: 2.75 MIL/uL — ABNORMAL LOW (ref 3.87–5.11)
RDW: 17.8 % — ABNORMAL HIGH (ref 11.5–15.5)
WBC: 22.5 10*3/uL — ABNORMAL HIGH (ref 4.0–10.5)
nRBC: 0 % (ref 0.0–0.2)

## 2023-04-28 LAB — ECHOCARDIOGRAM COMPLETE
AR max vel: 2.27 cm2
AV Area VTI: 2.34 cm2
AV Area mean vel: 2.44 cm2
AV Mean grad: 11.4 mm[Hg]
AV Peak grad: 22 mm[Hg]
Ao pk vel: 2.34 m/s
Area-P 1/2: 4.06 cm2
Est EF: >75
Height: 59 in
S' Lateral: 2.4 cm
Weight: 2447.99 [oz_av]

## 2023-04-28 LAB — BASIC METABOLIC PANEL
Anion gap: 16 — ABNORMAL HIGH (ref 5–15)
BUN: 98 mg/dL — ABNORMAL HIGH (ref 8–23)
CO2: 14 mmol/L — ABNORMAL LOW (ref 22–32)
Calcium: 7.8 mg/dL — ABNORMAL LOW (ref 8.9–10.3)
Chloride: 106 mmol/L (ref 98–111)
Creatinine, Ser: 10.28 mg/dL — ABNORMAL HIGH (ref 0.44–1.00)
GFR, Estimated: 4 mL/min — ABNORMAL LOW (ref >60)
Glucose, Bld: 99 mg/dL (ref 70–99)
Potassium: 4.1 mmol/L (ref 3.5–5.1)
Sodium: 136 mmol/L (ref 135–145)

## 2023-04-28 LAB — POCT I-STAT 7, (LYTES, BLD GAS, ICA,H+H)
Acid-base deficit: 12 mmol/L — ABNORMAL HIGH (ref 0.0–2.0)
Bicarbonate: 13.2 mmol/L — ABNORMAL LOW (ref 20.0–28.0)
Calcium, Ion: 1.17 mmol/L (ref 1.15–1.40)
HCT: 32 % — ABNORMAL LOW (ref 36.0–46.0)
Hemoglobin: 10.9 g/dL — ABNORMAL LOW (ref 12.0–15.0)
O2 Saturation: 99 %
Potassium: 3.5 mmol/L (ref 3.5–5.1)
Sodium: 135 mmol/L (ref 135–145)
TCO2: 14 mmol/L — ABNORMAL LOW (ref 22–32)
pCO2 arterial: 27.2 mm[Hg] — ABNORMAL LOW (ref 32–48)
pH, Arterial: 7.294 — ABNORMAL LOW (ref 7.35–7.45)
pO2, Arterial: 165 mm[Hg] — ABNORMAL HIGH (ref 83–108)

## 2023-04-28 LAB — GLUCOSE, CAPILLARY
Glucose-Capillary: 128 mg/dL — ABNORMAL HIGH (ref 70–99)
Glucose-Capillary: 129 mg/dL — ABNORMAL HIGH (ref 70–99)
Glucose-Capillary: 132 mg/dL — ABNORMAL HIGH (ref 70–99)
Glucose-Capillary: 200 mg/dL — ABNORMAL HIGH (ref 70–99)
Glucose-Capillary: 94 mg/dL (ref 70–99)
Glucose-Capillary: 98 mg/dL (ref 70–99)

## 2023-04-28 LAB — TRIGLYCERIDES
Triglycerides: 238 mg/dL — ABNORMAL HIGH (ref <150)
Triglycerides: 131 mg/dL (ref <150)

## 2023-04-28 LAB — PHOSPHORUS: Phosphorus: 9.7 mg/dL — ABNORMAL HIGH (ref 2.5–4.6)

## 2023-04-28 LAB — HEMOGLOBIN A1C
Hgb A1c MFr Bld: 6.2 % — ABNORMAL HIGH (ref 4.8–5.6)
Mean Plasma Glucose: 131 mg/dL

## 2023-04-28 LAB — MAGNESIUM: Magnesium: 1.9 mg/dL (ref 1.7–2.4)

## 2023-04-28 MED ORDER — ONDANSETRON HCL 4 MG PO TABS
4.0000 mg | ORAL_TABLET | Freq: Four times a day (QID) | ORAL | Status: DC | PRN
Start: 2023-04-28 — End: 2023-04-30

## 2023-04-28 MED ORDER — ONDANSETRON HCL 4 MG/2ML IJ SOLN: 4.0000 mg | Freq: Four times a day (QID) | INTRAMUSCULAR | Status: DC | PRN

## 2023-04-28 MED ORDER — MUPIROCIN 2 % EX OINT
TOPICAL_OINTMENT | Freq: Two times a day (BID) | CUTANEOUS | Status: DC
  Administered 2023-04-28 (×2): 1 via NASAL
  Filled 2023-04-28 (×2): qty 22

## 2023-04-28 MED ORDER — INSULIN ASPART 100 UNIT/ML IJ SOLN
0.0000 [IU] | INTRAMUSCULAR | Status: DC
  Administered 2023-04-29: 1 [IU] via SUBCUTANEOUS

## 2023-04-28 MED ORDER — SODIUM BICARBONATE 650 MG PO TABS
1300.0000 mg | ORAL_TABLET | Freq: Two times a day (BID) | ORAL | Status: DC
  Administered 2023-04-28 (×2): 1300 mg
  Filled 2023-04-28 (×3): qty 2

## 2023-04-28 MED ORDER — DOXYCYCLINE HYCLATE 100 MG PO TABS: 100.0000 mg | ORAL_TABLET | Freq: Two times a day (BID) | ORAL | Status: DC

## 2023-04-28 MED ORDER — PROSOURCE TF20 ENFIT COMPATIBL EN LIQD
60.0000 mL | Freq: Every day | ENTERAL | Status: DC
  Administered 2023-04-28: 60 mL
  Filled 2023-04-28 (×2): qty 60

## 2023-04-28 MED ORDER — CALCIUM ACETATE (PHOS BINDER) 667 MG PO CAPS: 1334.0000 mg | ORAL_CAPSULE | Freq: Three times a day (TID) | ORAL | Status: DC

## 2023-04-28 MED ORDER — ORAL CARE MOUTH RINSE
15.0000 mL | OROMUCOSAL | Status: DC
  Administered 2023-04-28 – 2023-04-29 (×11): 15 mL via OROMUCOSAL

## 2023-04-28 MED ORDER — SEVELAMER CARBONATE 800 MG PO TABS
1600.0000 mg | ORAL_TABLET | Freq: Three times a day (TID) | ORAL | Status: DC
  Administered 2023-04-28: 1600 mg
  Filled 2023-04-28: qty 2

## 2023-04-28 MED ORDER — OSMOLITE 1.2 CAL PO LIQD
1000.0000 mL | ORAL | Status: DC
  Administered 2023-04-28: 1000 mL
  Filled 2023-04-28 (×2): qty 1000

## 2023-04-28 MED ORDER — ORAL CARE MOUTH RINSE: 15.0000 mL | OROMUCOSAL | Status: DC | PRN

## 2023-04-28 MED ORDER — ACETAMINOPHEN 160 MG/5ML PO SOLN
650.0000 mg | Freq: Four times a day (QID) | ORAL | Status: DC | PRN
  Administered 2023-04-28: 650 mg
  Filled 2023-04-28: qty 20.3

## 2023-04-28 MED ORDER — GUAIFENESIN 100 MG/5ML PO LIQD
15.0000 mL | Freq: Four times a day (QID) | ORAL | Status: DC
  Administered 2023-04-28 – 2023-04-29 (×4): 15 mL
  Filled 2023-04-28 (×4): qty 15

## 2023-04-28 MED ORDER — ROSUVASTATIN CALCIUM 5 MG PO TABS
10.0000 mg | ORAL_TABLET | Freq: Every day | ORAL | Status: DC
  Administered 2023-04-28: 10 mg
  Filled 2023-04-28 (×2): qty 2

## 2023-04-28 MED ORDER — ACETAMINOPHEN 650 MG RE SUPP: 650.0000 mg | Freq: Four times a day (QID) | RECTAL | Status: DC | PRN

## 2023-04-28 MED ORDER — DOXYCYCLINE HYCLATE 100 MG IV SOLR
100.0000 mg | Freq: Two times a day (BID) | INTRAVENOUS | Status: DC
  Administered 2023-04-28 (×2): 100 mg via INTRAVENOUS
  Filled 2023-04-28 (×4): qty 100

## 2023-04-28 NOTE — Progress Notes (Addendum)
 NAME:  Allison Patel, MRN:  [id number], DOB:  06-03-1955, LOS: 1 ADMISSION DATE:  04/27/2023, CONSULTATION DATE:  04/28/2023 REFERRING MD:  Dr. Claudene TRH, CHIEF COMPLAINT:  respiratory failure, cardiac arrest   History of Present Illness:  Allison Patel is a 68 y.o. woman with past medical history of CKD Stage IV who presents from home with decreased level of consciousness. History obtained from her daughter as the patient is currently intubated and sedated.  Probably started having URI symptoms very suspicious for viral illness on Christmas time.  Both patient and her husband were sick.  Was not completely better, decreased oral intake, lethargic last night.  Last night the patient's husband was awoken by the dog who was making a lot of noise. He went to go check on his wife and found her with breathing agonal and not responding.  EMS was called.  Was brought to the ED and found to be hypoxemic when A-fib with RVR.  Influenza A positive.  While being admitted to the hospital medicine team underwent respiratory mediated cardiac arrest.  Was intubated and sedated.  She is not currently on any vasopressor support.  Of note the daughter says that the patient has not been good at taking care of herself.  She does not like going to the doctors.  She does go to her kidney appointments, but per the daughter was not doing everything she needed to be doing for pretransplant evaluation.  The patient also did not want aggressive medical care including resuscitation or intubation, but she thinks she might have been okay with it for a time-limited trial.  After seeing her undergo cardiac arrest in the ED the family did make her DNR.  PCCM is being consulted for admission to the hospital.  Pertinent  Medical History  DM2 CKD Stage IV HTN Obesity   Significant Hospital Events: Including procedures, antibiotic start and stop dates in addition to other pertinent events   01/05: present to hospital with hypoxemic  with Afib w RVR, respiratory mediated cardiac arrest in ED. Intubated  Interim History / Subjective:  Rates controlled in 90s. In NSR.  Intubated and sedated on vent.   Objective   Blood pressure (!) 123/48, pulse 92, temperature 99.1 F (37.3 C), resp. rate (!) 22, height 4' 11 (1.499 m), weight 69.4 kg, SpO2 99%.    Vent Mode: PRVC FiO2 (%):  [40 %-100 %] 40 % Set Rate:  [22 bmp] 22 bmp Vt Set:  [350 mL-500 mL] 350 mL PEEP:  [5 cmH20] 5 cmH20 Plateau Pressure:  [16 cmH20-21 cmH20] 16 cmH20   Intake/Output Summary (Last 24 hours) at 04/28/2023 0604 Last data filed at 04/28/2023 0500 Gross per 24 hour  Intake 380.4 ml  Output 10 ml  Net 370.4 ml   Filed Weights   04/27/23 0319 04/27/23 2109  Weight: 69.5 kg 69.4 kg   10 cc urine out Net+ 370cc  Examination: Gen:      Intubated, sedated, acutely and chronically ill appearing HEENT:  ETT to vent Lungs:    no wheezing or rhonchi appreciated CV:         regular rhythm, systolic murmur present Abd:      + bowel sounds; soft, non-tender; no palpable masses, obese Ext:    No lower extremity edema, LUE AVF +thrill and bruit Skin:      Warm and dry; no rashes Neuro:   sedated, RASS -4  Labs/Imaging personally reviewed MRSA + Flu+  Bicarb 14 AG  16 Creatinine 10.28 BUN 98 TG 238  WBC 22.5 Hgb 7.7  CXR 01/05 IMPRESSION: 1. Support apparatus, as above. Advancement of the nasogastric tube approximately 15 cm for more optimal placement is recommended. 2. Persistent atelectasis and/or consolidation in the left lower lobe with small left pleural effusion. 3. Increasing airspace consolidation in the inferior right upper lobe indicative of pneumonia. 4. Background of interstitial prominence and peribronchial cuffing suggestive of underlying bronchitis.  CT Head IMPRESSION: 1. No acute intracranial finding. 2. Right maxillary sinus and otomastoid opacification.  Resolved Hospital Problem list     Assessment & Plan:    Acute hypoxemic respiratory failure Influenza A Pneumonia, concerns for superinfection Volume overload MRSA + PCR nares. Will wean vent settings as tolerated. FiO2 at 40%. Will do trial with decreased sedation and SBT. Will confirm with family if they would want her to be reintubated if she were to not do well with extubation. - lung protective ventilation - respiratory culture, tracheal aspirate - blood cultures pending - empiric ceftriaxone  with concern for bacterial superinfection, day 2 - add doxycyline for atypicals - droplet precaution - strep pneumo and legionella pending  Cardiac arrest - likely mediated by hypoxemia from above Atrial fibrillation Hypertrophic Cardiomyopathy HTN - echo ordered and pending - monitor electrolytes and replace cautiously in setting of CKD - continue home meds for now as no evidence of shock  AKI on CKD Stage IV S/p AVF placement Anion Gap metabolic acidosis - likely from azotemia Patient hesitant about HD in past. AVF appears functional. - nephrology consultation for impending renal failure - monitor electrolytes  Type 2 DM with insulin  dependence Home insulin  with 34 units glargine and lispro SS. - Lantus  reduced dose, ISS - A1C pending  HTN Home medications include amlodipine  10 mg, clonidine  0.2 mg, metoprolol  tartrate 12.5 mg BID. -continue amlodipine  10 mg, clonidine  0.2 mg -consider restarting metoprolol  05/26/23  Best Practice (right click and Reselect all SmartList Selections daily)   Diet/type: NPO w/ meds via tube DVT prophylaxis prophylactic heparin   Pressure ulcer(s): pressure ulcer assessment deferred  GI prophylaxis: H2B Lines: N/A Foley:  Yes, and it is still needed Code Status:  DNR Last date of multidisciplinary goals of care discussion [pending]  Katheline Brendlinger M. Brenda Cowher, D.O.  Internal Medicine Resident, PGY-3 [facility]  Pager: [phone] 6:13 AM, 04/28/2023

## 2023-04-28 NOTE — Telephone Encounter (Signed)
 Copied from CRM [id number]. Topic: Clinical - Medical Advice >> Apr 28, 2023  9:51 AM Bascom RAMAN wrote: Reason for CRM: Patient is in ICU at [facility] room 2. Patient's daughter, Rosina, wants to know if the patient has ever discussed a DNR with Dr Perri. Or dialysis status. Callback [phone]

## 2023-04-28 NOTE — Telephone Encounter (Signed)
 Called Allison Patel to let her know we do not have anything on record about her wishes for DNR or a copy of her living will.

## 2023-04-28 NOTE — Plan of Care (Signed)
  Problem: Nutritional: Goal: Maintenance of adequate nutrition will improve Outcome: Progressing   Problem: Safety: Goal: Non-violent Restraint(s) Outcome: Progressing   Problem: Clinical Measurements: Goal: Ability to maintain clinical measurements within normal limits will improve Outcome: Progressing   Problem: Nutrition: Goal: Adequate nutrition will be maintained Outcome: Progressing   Problem: Pain Management: Goal: General experience of comfort will improve Outcome: Progressing   Problem: Education: Goal: Knowledge of General Education information will improve Description: Including pain rating scale, medication(s)/side effects and non-pharmacologic comfort measures Outcome: Not Progressing   Problem: Clinical Measurements: Goal: Will remain free from infection Outcome: Not Progressing

## 2023-04-28 NOTE — Progress Notes (Signed)
 [facility] Progress Note  Name: Allison Patel MRN: [id number] DOB: 1955/07/01  Chief Complaint:  unresponsive  Subjective:  Renal consulted on 1/5.  As below goals of care discussions were ongoing and she had not been aggressively pursuing all interventions for her CKD.  She had 10 mL UOP over 1/5 despite lasix  80 mg IV.  Spoke with her daughters (one at bedside and one via phone).  Her daughter states that she was actually sicker than she had told her family and they are not sure what she really would have wanted in terms of dialysis.  The patient's daughter was to have been formalized as her decision maker but this was never formalized.  Her husband and daughters are on the same page.  Her husband is still pretty distraught from seeing her code yesterday.  They state that AVF was in the past year.  Note left BC AVF placed 09/24/22 by Dr. Lanis.   Review of systems:  Unable to obtain secondary to intubated   ------------------- Background on consult:  Pt is a 32F with PMH sig for HTN, HLD, DM II, history of severe COVID infection requiring dialysis in 2020, advanced CKD, who is now seen in consultation at the request of Dr. Meade for eval and recs re: AKI and vol overload.  Pt initially was brought in by EMS after her dog and husband found her unresponsive.  She had had several days of not feeling well with URI symptoms- started around Christmas.  Brought in by EMS, noted to be hypoxemic and in Afib with RVR.  Flu A +.  Was originally admitted to the hospitalist service but while waiting for a bed had resp and cardiac arrest.  Received CPR and ACLS and achieved ROSC.  Dtr at bedside.  She says that her mom has been pretty lukewarm about aggressive medical intervention for her kidneys.  She has an AVF and they were talking about kidney transplant but she was not actively pursuing all recommendations.  Family has already asked that pt be a DNR.      Intake/Output Summary (Last 24  hours) at 04/28/2023 0700 Last data filed at 04/28/2023 0500 Gross per 24 hour  Intake 380.4 ml  Output 10 ml  Net 370.4 ml    Vitals:  Vitals:   04/28/23 0300 04/28/23 0400 04/28/23 0405 04/28/23 0500  BP: (!) 148/47 (!) 133/46  (!) 123/48  Pulse: 93 93  92  Resp: (!) 22 (!) 22  (!) 22  Temp: 99.3 F (37.4 C) 99.1 F (37.3 C)  99.1 F (37.3 C)  TempSrc: Bladder     SpO2: 98% 98% 98% 99%  Weight:      Height:   4' 11 (1.499 m)      Physical Exam:  General adult female in bed in no acute distress HEENT normocephalic atraumatic extraocular movements intact sclera anicteric Neck supple trachea midline Lungs coarse mechanical breath sounds; FIO2 40 and PEEP 5 Heart S1S2 no rub Abdomen soft nontender nondistended Extremities no pitting edema  Neuro - does not track or respond to abbreviated exam  Access LUE AVF with bruit   Medications reviewed   Labs:     Latest Ref Rng & Units 04/28/2023    4:33 AM 04/27/2023    4:07 AM 04/27/2023    3:42 AM  BMP  Glucose 70 - 99 mg/dL 99  810  814   BUN 8 - 23 mg/dL 98  95  89   Creatinine  0.44 - 1.00 mg/dL 89.71  0.42  0.19   Sodium 135 - 145 mmol/L 136  136  137    137   Potassium 3.5 - 5.1 mmol/L 4.1  3.5  3.5    3.5   Chloride 98 - 111 mmol/L 106  105  107   CO2 22 - 32 mmol/L 14  13    Calcium  8.9 - 10.3 mg/dL 7.8  8.0       Assessment/Plan:    AKI on CKD V: - has advanced CKD - has AVF and was discussing transplant but patient's daughter does not think that this is what she would want  - no emergent indication for RRT however anticipate a need in the next 24-48 hours if pursuing aggressive care.  If she does pursue aggressive care, then she does have an AVF which was cleared for use by vascular surgery after 12/25/22.  Per charting may need superficialization.  (After discussion they were to attempt to use first then see if needed superficialization) - will review outpatient EMR charting for guidance as to the patient's  preferences     2.  Acute hypoxic respiratory failure             - resulting in cardiac arrest             - intubated and flu A positive              - on antibiotics for superimposed PNA              3. Flu A +             - supportive measures per critical care   4. Metabolic acidosis  - start oral bicarbonate  5.  Cardiac arrest             - as above             - TTE was ordered per primary     6. Metabolic bone disease - with hyperphosphatemia  - start a binder    Disposition - in ICU  Katheryn JAYSON Saba, MD 04/28/2023 7:39 AM

## 2023-04-28 NOTE — Progress Notes (Signed)
*  PRELIMINARY RESULTS* Echocardiogram 2D Echocardiogram has been performed.  Laddie Aquas 04/28/2023, 10:54 AM

## 2023-04-28 NOTE — Progress Notes (Signed)
 Initial Nutrition Assessment  DOCUMENTATION CODES:  Non-severe (moderate) malnutrition in context of chronic illness  INTERVENTION:  Initiate tube feeding via OGT: Osmolite 1.2 at 50 ml/h (1200 ml per day) Start at 20 and advance by 10 q8h to goal of 50 Prosource TF20 60 ml 1x/d Provides 1520 kcal, 87 gm protein, 984 ml free water  daily  NUTRITION DIAGNOSIS:  Moderate Malnutrition related to chronic illness as evidenced by mild fat depletion, moderate muscle depletion.  GOAL:  Patient will meet greater than or equal to 90% of their needs  MONITOR:  TF tolerance, I & O's, Vent status, Labs  REASON FOR ASSESSMENT:  Ventilator, Consult Enteral/tube feeding initiation and management  ASSESSMENT:  Pt with hx of HTN, HLD, DM type 2, CKD5 (recent fistula placement, no HD yet), and hypothyroidism presented to ED after being found unresponsive on the couch. Husband reports recent URI. Found to be positive for Flu A.  1/5 - presented to ED with AMS, cardiac arrest in ED, intubated   Patient is currently intubated on ventilator support. OGT placed overnight and recommended advancing, RN advanced and repeat XR pending. Discussed in rounds, ok to start enteral feeds.  No family in room at the time of assessment to provide a nutrition hx. On exam, pt with some mild/moderate muscle and fat deficits. Also appears to have lost weight in the last month. Noted that pt has been evaluated by transplant team at [facility]. Unsure if weight loss was intentional. Will initiate TF and advance slowly.   MV: 8.3 L/min Temp (24hrs), Avg:99.5 F (37.5 C), Min:99 F (37.2 C), Max:100 F (37.8 C)  Propofol : 6.26 ml/hr (165 kcal/d)  Admit weight: 69.5 kg  Current weight: 69.4 kg 6.1% weight loss x 1 month which is severe if accurate   Intake/Output Summary (Last 24 hours) at 04/28/2023 1308 Last data filed at 04/28/2023 1000 Gross per 24 hour  Intake 481.4 ml  Output 10 ml  Net 471.4 ml  Net IO Since  Admission: 471.4 mL [04/28/23 1308]  Drains/Lines: OGT 72F UOP 10mL since admission AV Fistula  Nutritionally Relevant Medications: Scheduled Meds:  docusate  100 mg Per Tube BID   insulin  aspart  0-6 Units Subcutaneous Q4H   insulin  glargine-yfgn  15 Units Subcutaneous QHS   levothyroxine   100 mcg Per Tube Q0600   pantoprazole  IV  40 mg Intravenous Q24H   polyethylene glycol  17 g Per Tube Daily   rosuvastatin   40 mg Per Tube Daily   sevelamer  carbonate  1,600 mg Per Tube TID WC   sodium bicarbonate   1,300 mg Per Tube BID   Continuous Infusions:  cefTRIAXone  (ROCEPHIN )  IV Stopped (04/28/23 0508)   propofol  (DIPRIVAN ) infusion 15 mcg/kg/min (04/28/23 0800)   PRN Meds: docusate, ondansetron , polyethylene glycol  Labs Reviewed: BUN 98, creatinine 10.28 Phosphorus 9.7 CBG ranges from 73-98 mg/dL over the last 24 hours HgbA1c 7.9% 6/13  NUTRITION - FOCUSED PHYSICAL EXAM: Flowsheet Row Most Recent Value  Orbital Region Mild depletion  Upper Arm Region Mild depletion  Thoracic and Lumbar Region No depletion  Buccal Region Moderate depletion  Temple Region No depletion  Clavicle Bone Region No depletion  Clavicle and Acromion Bone Region Mild depletion  Scapular Bone Region No depletion  Patellar Region Moderate depletion  Anterior Thigh Region Moderate depletion  Posterior Calf Region Moderate depletion  Edema (RD Assessment) Moderate  [hands]  Hair Reviewed  Eyes Reviewed  Mouth Reviewed  Skin Reviewed  Nails Reviewed    Diet  Order:   Diet Order             Diet NPO time specified  Diet effective now                   EDUCATION NEEDS:   Not appropriate for education at this time  Skin:  Skin Assessment: Reviewed RN Assessment Stage 1 - sacrum, bilateral (5 x 2.5 cm)  Last BM:  PTA  Height:  Ht Readings from Last 1 Encounters:  04/28/23 4' 11 (1.499 m)    Weight:  Wt Readings from Last 1 Encounters:  04/27/23 69.4 kg    Ideal Body  Weight:  44.7 kg  BMI:  Body mass index is 30.9 kg/m.  Estimated Nutritional Needs:  Kcal:  1500-1700 kcal/d Protein:  75-90 g/d Fluid:  1L+UOP    Vernell Lukes, RD, LDN Registered Dietitian II Please reach out via secure chat Weekend on-call pager # available in [facility]

## 2023-04-29 ENCOUNTER — Encounter: Payer: Self-pay | Admitting: Internal Medicine

## 2023-04-29 ENCOUNTER — Telehealth: Payer: Self-pay | Admitting: Internal Medicine

## 2023-04-29 DIAGNOSIS — J9601 Acute respiratory failure with hypoxia: Secondary | ICD-10-CM | POA: Diagnosis not present

## 2023-04-29 DIAGNOSIS — I469 Cardiac arrest, cause unspecified: Secondary | ICD-10-CM | POA: Diagnosis not present

## 2023-04-29 LAB — RENAL FUNCTION PANEL
Albumin: <1.5 g/dL — ABNORMAL LOW (ref 3.5–5.0)
Anion gap: 19 — ABNORMAL HIGH (ref 5–15)
BUN: 111 mg/dL — ABNORMAL HIGH (ref 8–23)
CO2: 12 mmol/L — ABNORMAL LOW (ref 22–32)
Calcium: 7.5 mg/dL — ABNORMAL LOW (ref 8.9–10.3)
Chloride: 104 mmol/L (ref 98–111)
Creatinine, Ser: 10.97 mg/dL — ABNORMAL HIGH (ref 0.44–1.00)
GFR, Estimated: 3 mL/min — ABNORMAL LOW (ref >60)
Glucose, Bld: 127 mg/dL — ABNORMAL HIGH (ref 70–99)
Phosphorus: 10.4 mg/dL — ABNORMAL HIGH (ref 2.5–4.6)
Potassium: 3.8 mmol/L (ref 3.5–5.1)
Sodium: 135 mmol/L (ref 135–145)

## 2023-04-29 LAB — CBC
HCT: 21.3 % — ABNORMAL LOW (ref 36.0–46.0)
Hemoglobin: 6.5 g/dL — CL (ref 12.0–15.0)
MCH: 27.8 pg (ref 26.0–34.0)
MCHC: 30.5 g/dL (ref 30.0–36.0)
MCV: 91 fL (ref 80.0–100.0)
Platelets: 334 10*3/uL (ref 150–400)
RBC: 2.34 MIL/uL — ABNORMAL LOW (ref 3.87–5.11)
RDW: 18.3 % — ABNORMAL HIGH (ref 11.5–15.5)
WBC: 16 10*3/uL — ABNORMAL HIGH (ref 4.0–10.5)
nRBC: 0.1 % (ref 0.0–0.2)

## 2023-04-29 LAB — HEMOGLOBIN A1C
Hgb A1c MFr Bld: 6.3 % — ABNORMAL HIGH (ref 4.8–5.6)
Mean Plasma Glucose: 134 mg/dL

## 2023-04-29 LAB — GLUCOSE, CAPILLARY
Glucose-Capillary: 127 mg/dL — ABNORMAL HIGH (ref 70–99)
Glucose-Capillary: 124 mg/dL — ABNORMAL HIGH (ref 70–99)

## 2023-04-29 LAB — PHOSPHORUS: Phosphorus: 10.2 mg/dL — ABNORMAL HIGH (ref 2.5–4.6)

## 2023-04-29 LAB — MAGNESIUM: Magnesium: 1.9 mg/dL (ref 1.7–2.4)

## 2023-04-29 MED ORDER — HYDROMORPHONE BOLUS VIA INFUSION: 1.0000 mg | INTRAVENOUS | Status: DC | PRN

## 2023-04-29 MED ORDER — DARBEPOETIN ALFA 60 MCG/0.3ML IJ SOSY: 60.0000 ug | PREFILLED_SYRINGE | INTRAMUSCULAR | Status: DC

## 2023-04-29 MED ORDER — GLYCOPYRROLATE 0.2 MG/ML IJ SOLN
0.2000 mg | INTRAMUSCULAR | Status: DC | PRN
  Administered 2023-04-29: 0.2 mg via INTRAVENOUS
  Filled 2023-04-29: qty 1

## 2023-04-29 MED ORDER — GLYCOPYRROLATE 1 MG PO TABS: 1.0000 mg | ORAL_TABLET | ORAL | Status: DC | PRN

## 2023-04-29 MED ORDER — GLYCOPYRROLATE 0.2 MG/ML IJ SOLN: 0.2000 mg | INTRAMUSCULAR | Status: DC | PRN

## 2023-04-29 MED ORDER — ACETAMINOPHEN 650 MG RE SUPP: 650.0000 mg | Freq: Four times a day (QID) | RECTAL | Status: DC | PRN

## 2023-04-29 MED ORDER — HYDROMORPHONE HCL-NACL 50-0.9 MG/50ML-% IV SOLN
0.0000 mg/h | INTRAVENOUS | Status: DC
  Administered 2023-04-29: 1 mg/h via INTRAVENOUS
  Filled 2023-04-29: qty 50

## 2023-04-29 MED ORDER — POLYVINYL ALCOHOL 1.4 % OP SOLN: 1.0000 [drp] | Freq: Four times a day (QID) | OPHTHALMIC | Status: DC | PRN

## 2023-04-29 MED ORDER — LORAZEPAM 2 MG/ML IJ SOLN
2.0000 mg | INTRAMUSCULAR | Status: DC | PRN
  Administered 2023-04-29: 4 mg via INTRAVENOUS
  Filled 2023-04-29: qty 2

## 2023-04-29 MED ORDER — LORAZEPAM 2 MG/ML IJ SOLN: 2.0000 mg | INTRAMUSCULAR | Status: DC | PRN

## 2023-04-29 MED ORDER — ACETAMINOPHEN 325 MG PO TABS: 650.0000 mg | ORAL_TABLET | Freq: Four times a day (QID) | ORAL | Status: DC | PRN

## 2023-05-02 LAB — CULTURE, BLOOD (ROUTINE X 2)
Culture: NO GROWTH
Culture: NO GROWTH
Special Requests: ADEQUATE

## 2023-05-12 ENCOUNTER — Encounter (HOSPITAL_COMMUNITY): Payer: Medicare HMO

## 2023-05-24 NOTE — Telephone Encounter (Signed)
 I have checked and  we do not have any living will on file in [facility]. We do not have paper records in the office any longer since we have been on EPIC for a number of years. Would suggest checking with [facility] or Dr. Faythe, Endocrinologist. Allison Patel, her daughter, and left voice mail message today.MJB, MD

## 2023-05-24 NOTE — Procedures (Signed)
 Extubation Procedure Note  Patient Details:   Name: Allison Patel DOB: [birth year] MRN: [id number]   Airway Documentation:    Vent end date: May 25, 2023 Vent end time: 2122   Evaluation  O2 sats: currently acceptable Complications: No apparent complications Patient did tolerate procedure well. Bilateral Breath Sounds: Clear, Diminished   Pt extubated per order for comfort measures RN at bedside. Family at bedside.   Allison Patel JAYSON Mattock May 25, 2023, 9:24 PM

## 2023-05-24 NOTE — Progress Notes (Signed)
 eLink Physician-Brief Progress Note Patient Name: Allison Patel DOB: 13-Jan-1956 MRN: [id number]   Date of Service  05/11/23  HPI/Events of Note  68 y.o. woman with past medical history of CKD Stage IV who presents from home with decreased level of consciousness transition to comfort measures today.  Extubated. Substantial secretions and anxiety.  Family at bedside.   eICU Interventions  Increase frequency of Ativan  as needed  Increase frequency of glycopyrrolate      Intervention Category Minor Interventions: Routine modifications to care plan (e.g. PRN medications for pain, fever)  Ebany Bowermaster 05-11-2023, 9:47 PM

## 2023-05-24 NOTE — Accreditation Note (Signed)
Restraint death within 24 hours of removal of bilateral soft wrist restraints logged 05/12/2023 at 1027 by Laurene Footman RN.

## 2023-05-24 NOTE — Progress Notes (Signed)
  Progress Note   Date: 05-11-23  Patient Name: Allison Patel        MRN#: [id number]   The diagnosis of sepsis    sepsis ruled in (please provide the supporting clinical data/criteria used)    Sepsis secondary to influenza A infection

## 2023-05-24 NOTE — Progress Notes (Signed)
 Pt declared expired,  heart and lung sounds auscultated by two nurses Sallyanne Molt RN and Harlene Burton RN asystole on EKG family members  at the bedside, I.e. Husband, pt's brother, son in law, daughter, and other family members. [facility] notified Ref [phone] of pt's time of death and requested information. [facility] person [place] . Pt's belongings taken by family. Patient placement updated, post mortem care completed.Patient Placement Coordinator notifed prior to transfer to the morgue. Pt transferred to the morgue at 0045 with security present.

## 2023-05-24 NOTE — Progress Notes (Signed)
 Nutrition Brief Note  Chart reviewed. Pt now transitioning to comfort care.  No further nutrition interventions planned at this time.  Please re-consult as needed.   Greig Castilla, RD, LDN Registered Dietitian II Please reach out via secure chat Weekend on-call pager # available in [facility]

## 2023-05-24 NOTE — Progress Notes (Addendum)
 eLink Physician-Brief Progress Note Patient Name: Allison Patel DOB: 1955/05/15 MRN: [id number]   Date of Service  05/12/23  HPI/Events of Note   68 y.o. woman with past medical history of CKD Stage IV who presents from home with decreased level of consciousness. History obtained from her daughter as the patient is currently intubated and sedated.  Hemoglobin 6.5.  No hemodynamic compromise, not on vasopressors, anticipating transition to comfort measures within 24-48 hours  eICU Interventions  Continue to monitor hemoglobin.  No evidence of hemodynamic disturbance at this time.  Continue GOC conversations.   9471 -patient is a MAP of 52 but wide pulse pressures.  Maintain systolic greater than 100 for the time being.  Discontinue amlodipine  10 mg for this morning.  If BP does not recover, would hold clonidine  with holding parameters.    Intervention Category Intermediate Interventions: Bleeding - evaluation and treatment with blood products  Hamza Empson 2023/05/12, 3:43 AM

## 2023-05-24 NOTE — Progress Notes (Signed)
 [facility] Progress Note  Name: Allison Patel MRN: [id number] DOB: 1956/03/17  Chief Complaint:  unresponsive  Subjective:  Patient had 15 ml UOP over 1/6.  She has followed with Dr. Prescilla at [facility] for CKD stage V.  Last seen in clinic on 12/5.  Per charting Dr. Prescilla has been concerned that the patient has poor insight as to the severity of her CKD.  Note left BC AVF placed 09/24/22 by Dr. Lanis which is currently ready for use as of last fall but may need to be superficialized.  She has not had an emergent indication for dialysis however they have been watching for an indication to start.  She has declined starting dialysis most recently.  Her daughters think that the she was just going through the motions and really didn't want dialysis.  She failed her FIT test for transplant eval and was referred to PT.  Nephrology was uncertain as to whether she would be willing to go through transplant but had referred.  She has had times had delayed follow-up and has sometimes not returned phone messages with medical recommendations.   Spoke with her two daughters, her husband, and her brother at bedside with critical care MD.  The family does not want to pursue dialysis.  Her husband feels that she has already passed away when she arrested.  He wanted her to have a dignified death.  Family states she had not been aggressively pursuing all interventions for her CKD.  The patient's daughter shared that she was to have been formalized as her decision maker but this was never formalized.    01/23/23 - Cr 7.72, BUN 79, eGFR 5 11/2022 - Cr 7.13, eGFR 6 09/2022 - Cr 6.88, eGFR 6 08/2022 - Cr 5.30, eGFR 8  In taking everything together, her family has made the decision to remove the breathing tube and transition to comfort measures.  They feel that she would want to go out on her own terms.   Review of systems:  Unable to obtain secondary to intubated and  sedated  ------------------- Background on consult:  Pt is a 14F with PMH sig for HTN, HLD, DM II, history of severe COVID infection requiring dialysis in 2020, advanced CKD, who is now seen in consultation at the request of Dr. Meade for eval and recs re: AKI and vol overload.  Pt initially was brought in by EMS after her dog and husband found her unresponsive.  She had had several days of not feeling well with URI symptoms- started around Christmas.  Brought in by EMS, noted to be hypoxemic and in Afib with RVR.  Flu A +.  Was originally admitted to the hospitalist service but while waiting for a bed had resp and cardiac arrest.  Received CPR and ACLS and achieved ROSC.  Dtr at bedside.  She says that her mom has been pretty lukewarm about aggressive medical intervention for her kidneys.  She has an AVF and they were talking about kidney transplant but she was not actively pursuing all recommendations.  Family has already asked that pt be a DNR.      Intake/Output Summary (Last 24 hours) at 2023/05/04 0709 Last data filed at 2023-05-04 0656 Gross per 24 hour  Intake 1276.78 ml  Output 15 ml  Net 1261.78 ml    Vitals:  Vitals:   05-04-2023 0530 04-May-2023 0545 2023-05-04 0600 2023/05/04 0615  BP: (!) 125/38 (!) 144/46 (!) 143/44 (!) 122/38  Pulse: 77 79 81 79  Resp: (!) 21 18 (!) 22 (!) 22  Temp: (!) 97.5 F (36.4 C) (!) 97.3 F (36.3 C) (!) 97.2 F (36.2 C) (!) 97.2 F (36.2 C)  TempSrc:  Bladder  Bladder  SpO2: 100% 100% 100% 100%  Weight:      Height:         Physical Exam:  General adult female in bed in no acute distress HEENT normocephalic atraumatic extraocular movements intact sclera anicteric Neck supple trachea midline Lungs coarse mechanical breath sounds Heart S1S2 no rub Abdomen soft nontender nondistended Extremities no pitting edema  Neuro - does not track or respond to commands; sedation is currently running Access LUE AVF with faint bruit   Medications reviewed    Labs:     Latest Ref Rng & Units 05/02/23    2:52 AM 04/28/2023    4:33 AM 04/27/2023   10:26 AM  BMP  Glucose 70 - 99 mg/dL 872  99    BUN 8 - 23 mg/dL 888  98    Creatinine 9.55 - 1.00 mg/dL 89.02  89.71    Sodium 135 - 145 mmol/L 135  136  135   Potassium 3.5 - 5.1 mmol/L 3.8  4.1  3.5   Chloride 98 - 111 mmol/L 104  106    CO2 22 - 32 mmol/L 12  14    Calcium  8.9 - 10.3 mg/dL 7.5  7.8       Assessment/Plan:    AKI on CKD V: - has advanced CKD that has progressed to ESRD and she is not pursuing dialysis  - her family is pursuing comfort measures    2.  Acute hypoxic respiratory failure - intubated and flu A positive  - has been on antibiotics for superimposed PNA  - note that her family has elected to transition to comfort measures and plans to extubate              3. Flu A +             - supportive measures per critical care   4. Metabolic acidosis  - stop oral bicarbonate  5.  S/p Cardiac arrest - supportive care per primary team    6. Metabolic bone disease - with hyperphosphatemia  - stopping renvela  with the transition to comfort measures  7. Anemia of CKD -  defer ESA and would stop blood draws   Disposition - in ICU.  Her family is transitioning to comfort measures.  Comfort care orders per critical care  Katheryn JAYSON Saba, MD 2023-05-02 8:12 AM

## 2023-05-24 NOTE — Progress Notes (Signed)
 NAME:  Allison Patel, MRN:  [id number], DOB:  [birth year], LOS: 2 ADMISSION DATE:  04/27/2023, CONSULTATION DATE:  05/26/2023 REFERRING MD:  Dr. Claudene TRH, CHIEF COMPLAINT:  respiratory failure, cardiac arrest   History of Present Illness:  Allison Patel is a 68 y.o. woman with past medical history of CKD Stage IV who presents from home with decreased level of consciousness. History obtained from her daughter as the patient is currently intubated and sedated.  Probably started having URI symptoms very suspicious for viral illness on Christmas time.  Both patient and her husband were sick.  Was not completely better, decreased oral intake, lethargic last night.  Last night the patient's husband was awoken by the dog who was making a lot of noise. He went to go check on his wife and found her with breathing agonal and not responding.  EMS was called.  Was brought to the ED and found to be hypoxemic when A-fib with RVR.  Influenza A positive.  While being admitted to the hospital medicine team underwent respiratory mediated cardiac arrest.  Was intubated and sedated.  She is not currently on any vasopressor support.  Of note the daughter says that the patient has not been good at taking care of herself.  She does not like going to the doctors.  She does go to her kidney appointments, but per the daughter was not doing everything she needed to be doing for pretransplant evaluation.  The patient also did not want aggressive medical care including resuscitation or intubation, but she thinks she might have been okay with it for a time-limited trial.  After seeing her undergo cardiac arrest in the ED the family did make her DNR.  PCCM is being consulted for admission to the hospital.  Pertinent  Medical History  DM2 CKD Stage IV HTN Obesity  Significant Hospital Events: Including procedures, antibiotic start and stop dates in addition to other pertinent events   01/05: present to hospital with hypoxemic  with Afib w RVR, respiratory mediated cardiac arrest in ED. Intubated  Interim History / Subjective:  Hgb 6.5, MAP 52 with wide pulse pressures. Systolic goal of > 100. Amlodipine  10 mg held. Febrile to 100.49F. Echo 01/06 with EF > 75%, Severe left ventricular hypertrophy.  Intubated and sedated, daughter Rosina at bedside initially this AM.  CCM team called back in room when husband arrived with brother, Alm. Both daughters present at that time. Objective   Blood pressure (!) 122/38, pulse 79, temperature (!) 97.2 F (36.2 C), temperature source Bladder, resp. rate (!) 22, height 4' 11 (1.499 m), weight 69.4 kg, SpO2 100%.    Vent Mode: PRVC FiO2 (%):  [40 %] 40 % Set Rate:  [22 bmp] 22 bmp Vt Set:  [350 mL] 350 mL PEEP:  [5 cmH20] 5 cmH20 Plateau Pressure:  [14 cmH20-16 cmH20] 16 cmH20   Intake/Output Summary (Last 24 hours) at 05/26/2023 0648 Last data filed at 26-May-2023 0641 Gross per 24 hour  Intake 1332.2 ml  Output 15 ml  Net 1317.2 ml   Filed Weights   04/27/23 0319 04/27/23 2109  Weight: 69.5 kg 69.4 kg   15 cc urine out Net+1.2L  Examination: Gen:      Intubated, sedated, acutely and chronically ill appearing HEENT:  ETT to vent Lungs:    no wheezing or rhonchi appreciated CV:         regular rhythm, systolic murmur present Abd:      + bowel sounds; soft,  non-tender; no palpable masses, obese Ext:    No lower extremity edema, LUE AVF +thrill and bruit Skin:      Warm and dry; no rashes Neuro:   sedated, RASS -4  Labs/Imaging personally reviewed Bicarb 12 AG 19 Creatinine 10.97 BUN 111 Corrected Ca 9.5 Phos 10.4 TG 238  WBC 22-> 16 Hgb 7.7-> 6.5  Resolved Hospital Problem list     Assessment & Plan:   Acute hypoxemic respiratory failure Influenza A Pneumonia, concerns for superinfection Volume overload Cardiac arrest - likely mediated by hypoxemia from above Atrial fibrillation Hypertrophic Cardiomyopathy HTN AKI on CKD Stage IV S/p AVF  placement Anion Gap metabolic acidosis - likely from azotemia Transition to comfort care. Weaning sedation and starting dilaudid  drip. Daughters would like to be in room for extubation.  -DNR-comfort -dilaudid  drip -robinul  IV pRN -Ativan  PRN   Best Practice (right click and Reselect all SmartList Selections daily)   Diet/type: NPO w/ meds via tube DVT prophylaxis prophylactic heparin   Pressure ulcer(s): pressure ulcer assessment deferred  GI prophylaxis: H2B Lines: N/A Foley:  Yes, and it is still needed Code Status:  DNR Last date of multidisciplinary goals of care discussion [01/07-transitioned to comfort care this AM]  Izetta HERO. Meeya Goldin, D.O.  Internal Medicine Resident, PGY-3 [facility]  Pager: [phone] 6:48 AM, 05-05-23

## 2023-05-24 NOTE — Death Summary Note (Signed)
 DEATH SUMMARY   Patient Details  Name: Allison Patel MRN: [id number] DOB: 11/29/1955  Admission/Discharge Information   Admit Date:  05/02/23  Date of Death: Date of Death: May 04, 2023  Time of Death: Time of Death: 2156-04-02  Length of Stay: 2  Referring Physician: Perri Ronal PARAS, MD   Reason(s) for Hospitalization  Patient was brought into the hospital with decreased level of consciousness.  Diagnoses  Preliminary cause of death:  Multiorgan failure Secondary Diagnoses (including complications and co-morbidities):  Principal Problem:   Influenza A Active Problems:   Adult hypothyroidism   Acute respiratory failure with hypoxia (HCC)   Fluid overload   CKD (chronic kidney disease) stage V requiring chronic dialysis (HCC)   New onset atrial fibrillation (HCC)   Elevated troponin   Hypertensive urgency   Acute metabolic encephalopathy   Uncontrolled type 2 diabetes mellitus with hyperglycemia, with long-term current use of insulin  (HCC)   Anemia of chronic disease   Thrombocytosis Acute on chronic kidney disease Influenza A infection Sepsis Atrial fibrillation Hypertrophic cardiomyopathy  Brief Hospital Course (including significant findings, care, treatment, and services provided and events leading to death)  Allison Patel is a 68 y.o. year old female who was brought in by family after being found with altered mental status-agonal breathing. Had been sick for few days prior-felt to be related to upper respite tract infection On initial evaluation hypoxemic, in atrial fibrillation, influenza A positive.  Intubated and sedated, admitted to the ICU.  While being evaluated in the ED, patient did sustain a cardiorespiratory arrest and had to be resuscitated.  Following resuscitation efforts, patient was transitioned to DNR status. Underlying chronic kidney disease with worsening noted recently, patient was noncommittal to having dialysis. During this hospitalization, she is in  acute kidney injury on chronic kidney disease. Was maintained on the ventilator, empiric antibiotics, hemodynamic support. Goals of care discussions with family following several conversations with family members including patient's daughters and spouse, decision was made to to transition to comfort measures as patient will not desire to have dialysis.  Patient was transitioned to comfort measures and succumbed to her illness 2156-04-02 on May 04, 2023  Pertinent Labs and Studies  Significant Diagnostic Studies DG Abd Portable 1V Result Date: 04/28/2023 CLINICAL DATA:  Orogastric tube placement. EXAM: PORTABLE ABDOMEN - 1 VIEW COMPARISON:  Radiographs 04/28/2023.  CT 10/06/2018. FINDINGS: 1158 hours. Two views submitted. The enteric tube has been advanced, tip now overlying the left upper quadrant of the abdomen, consistent with position in the mid stomach. The visualized bowel gas pattern is normal. Persistent left lower lobe airspace disease and probable left pleural effusion. There is a nonacute appearing healing fracture of the right superior pubic ramus. IMPRESSION: Enteric tube tip overlies the mid stomach. Electronically Signed   By: Elsie Perone M.D.   On: 04/28/2023 13:27   ECHOCARDIOGRAM COMPLETE Result Date: 04/28/2023    ECHOCARDIOGRAM REPORT   Patient Name:   RAYAN DYAL Date of Exam: 04/28/2023 Medical Rec #:  [id number]       Height:       59.0 in Accession #:    [id number]      Weight:       153.0 lb Date of Birth:  03-Apr-1956       BSA:          1.646 m Patient Age:    67 years        BP:           176/69  mmHg Patient Gender: F               HR:           103 bpm. Exam Location:  Inpatient Procedure: 2D Echo, Cardiac Doppler and Color Doppler Indications:    A-fib; cardiac arrest  History:        Patient has prior history of Echocardiogram examinations, most                 recent 10/14/2018. CKD V, Signs/Symptoms:Dyspnea; Risk                 Factors:Non-Smoker, Dyslipidemia, Diabetes and  Hypertension.  Sonographer:    Tillman Nora RVT RCS Referring Phys: [phone] RONDELL A SMITH IMPRESSIONS  1. Left ventricular ejection fraction, by estimation, is >75%. The left ventricle has hyperdynamic function. The left ventricle has no regional wall motion abnormalities. There is severe left ventricular hypertrophy. Left ventricular diastolic parameters are consistent with Grade I diastolic dysfunction (impaired relaxation).  2. Right ventricular systolic function is normal. The right ventricular size is normal. Tricuspid regurgitation signal is inadequate for assessing PA pressure.  3. Left atrial size was moderately dilated.  4. A small pericardial effusion is present. The pericardial effusion is posterior to the left ventricle. There is no evidence of cardiac tamponade.  5. The mitral valve is abnormal. Trivial mitral valve regurgitation.  6. The aortic valve is tricuspid. Aortic valve regurgitation is trivial. Aortic valve sclerosis/calcification is present, without any evidence of aortic stenosis.  7. The inferior vena cava is normal in size with greater than 50% respiratory variability, suggesting right atrial pressure of 3 mmHg. Comparison(s): 09/2018-EF >65%. FINDINGS  Left Ventricle: Left ventricular ejection fraction, by estimation, is >75%. The left ventricle has hyperdynamic function. The left ventricle has no regional wall motion abnormalities. The left ventricular internal cavity size was small. There is severe left ventricular hypertrophy. Left ventricular diastolic parameters are consistent with Grade I diastolic dysfunction (impaired relaxation). Indeterminate filling pressures. Right Ventricle: The right ventricular size is normal. No increase in right ventricular wall thickness. Right ventricular systolic function is normal. Tricuspid regurgitation signal is inadequate for assessing PA pressure. Left Atrium: Left atrial size was moderately dilated. Right Atrium: Right atrial size was normal in  size. Pericardium: A small pericardial effusion is present. The pericardial effusion is posterior to the left ventricle. There is diastolic collapse of the right atrial wall. There is no evidence of cardiac tamponade. Mitral Valve: The mitral valve is abnormal. There is mild calcification of the posterior mitral valve leaflet(s). Mild mitral annular calcification. Trivial mitral valve regurgitation. Tricuspid Valve: The tricuspid valve is normal in structure. Tricuspid valve regurgitation is not demonstrated. Aortic Valve: The aortic valve is tricuspid. Aortic valve regurgitation is trivial. Aortic valve sclerosis/calcification is present, without any evidence of aortic stenosis. Aortic valve mean gradient measures 11.4 mmHg. Aortic valve peak gradient measures 22.0 mmHg. Aortic valve area, by VTI measures 2.34 cm. Pulmonic Valve: The pulmonic valve was normal in structure. Pulmonic valve regurgitation is not visualized. Aorta: The aortic root and ascending aorta are structurally normal, with no evidence of dilitation. Venous: The inferior vena cava is normal in size with greater than 50% respiratory variability, suggesting right atrial pressure of 3 mmHg. IAS/Shunts: No atrial level shunt detected by color flow Doppler.  LEFT VENTRICLE PLAX 2D LVIDd:         3.80 cm LVIDs:         2.40 cm LV PW:  1.30 cm LV IVS:        1.40 cm LVOT diam:     1.80 cm LV SV:         82 LV SV Index:   50 LVOT Area:     2.54 cm  RIGHT VENTRICLE             IVC RV Basal diam:  3.40 cm     IVC diam: 1.20 cm RV S prime:     15.20 cm/s TAPSE (M-mode): 1.9 cm LEFT ATRIUM             Index        RIGHT ATRIUM           Index LA diam:        3.20 cm 1.94 cm/m   RA Area:     13.70 cm LA Vol (A2C):   66.0 ml 40.10 ml/m  RA Volume:   30.90 ml  18.77 ml/m LA Vol (A4C):   58.6 ml 35.60 ml/m LA Biplane Vol: 62.0 ml 37.67 ml/m  AORTIC VALVE                     PULMONIC VALVE AV Area (Vmax):    2.27 cm      PV Vmax:       1.34 m/s AV  Area (Vmean):   2.44 cm      PV Peak grad:  7.2 mmHg AV Area (VTI):     2.34 cm AV Vmax:           234.40 cm/s AV Vmean:          155.400 cm/s AV VTI:            0.352 m AV Peak Grad:      22.0 mmHg AV Mean Grad:      11.4 mmHg LVOT Vmax:         209.00 cm/s LVOT Vmean:        149.000 cm/s LVOT VTI:          0.324 m LVOT/AV VTI ratio: 0.92  AORTA Ao Root diam: 2.40 cm Ao Asc diam:  2.90 cm MITRAL VALVE MV Area (PHT): 4.06 cm     SHUNTS MV Decel Time: 187 msec     Systemic VTI:  0.32 m MV E velocity: 141.00 cm/s  Systemic Diam: 1.80 cm MV A velocity: 184.50 cm/s MV E/A ratio:  0.76 Vinie Maxcy MD Electronically signed by Vinie Maxcy MD Signature Date/Time: 04/28/2023/11:17:50 AM    Final    DG Abd Portable 1V Result Date: 04/28/2023 CLINICAL DATA:  Tube placement EXAM: PORTABLE ABDOMEN - 1 VIEW limited for tube placement COMPARISON:  None Available. FINDINGS: Limited view of the upper abdomen portable supine demonstrates enteric tube in place with side hole overlying the area of the GE junction in the distal tip of the proximal stomach. Gas seen elsewhere along nondilated loops in the visualized portions of the abdomen. Surgical clips in the right upper quadrant. Opacity seen left retrocardiac. Please correlate with separate chest x-ray IMPRESSION: Limited x-ray for tube placement has a NG tube with the tip overlying the upper stomach with side hole at the GE junction. This could be advanced further into the stomach. Electronically Signed   By: Ranell Bring M.D.   On: 04/28/2023 09:57   DG Chest Portable 1 View Result Date: 04/27/2023 CLINICAL DATA:  68 year old female status post intubation. EXAM: PORTABLE CHEST 1 VIEW COMPARISON:  Chest x-ray  04/27/2023. FINDINGS: An endotracheal tube is in place with tip 2.7 cm above the carina. Nasogastric tube noted with tip near the gastroesophageal junction and side port in the distal third of the esophagus. Transcutaneous defibrillator pads project over the lower  left hemithorax. Increasing airspace consolidation in the inferior aspect of the right upper lobe. Opacity at the left base which may reflect atelectasis and/or consolidation. Small left pleural effusion. No definite right pleural effusion. Diffuse interstitial prominence and peribronchial cuffing. No pneumothorax. No evidence of pulmonary edema. Heart size appears borderline enlarged, likely accentuated by low lung volumes, portable AP technique and patient's rotation to the left which also distorts mediastinal contours. IMPRESSION: 1. Support apparatus, as above. Advancement of the nasogastric tube approximately 15 cm for more optimal placement is recommended. 2. Persistent atelectasis and/or consolidation in the left lower lobe with small left pleural effusion. 3. Increasing airspace consolidation in the inferior right upper lobe indicative of pneumonia. 4. Background of interstitial prominence and peribronchial cuffing suggestive of underlying bronchitis. Electronically Signed   By: Toribio Aye M.D.   On: 04/27/2023 09:46   CT Head Wo Contrast Result Date: 04/27/2023 CLINICAL DATA:  Found unresponsive, atrial fibrillation with RVR EXAM: CT HEAD WITHOUT CONTRAST TECHNIQUE: Contiguous axial images were obtained from the base of the skull through the vertex without intravenous contrast. RADIATION DOSE REDUCTION: This exam was performed according to the departmental dose-optimization program which includes automated exposure control, adjustment of the mA and/or kV according to patient size and/or use of iterative reconstruction technique. COMPARISON:  10/06/2018 FINDINGS: Brain: No evidence of acute infarction, hemorrhage, hydrocephalus, extra-axial collection or mass lesion/mass effect. Vascular: No hyperdense vessel or unexpected calcification. Skull: No acute finding Sinuses/Orbits: Mucosal thickening with fluid level in the right maxillary sinus. Right mastoid and middle ear opacification with negative in  symmetric nasopharynx. IMPRESSION: 1. No acute intracranial finding. 2. Right maxillary sinus and otomastoid opacification. Electronically Signed   By: Dorn Roulette M.D.   On: 04/27/2023 04:49   DG Chest Port 1 View Result Date: 04/27/2023 CLINICAL DATA:  Shortness of breath EXAM: PORTABLE CHEST 1 VIEW COMPARISON:  10/30/2018 FINDINGS: Cardiopericardial enlargement. Low volume chest with indistinct opacity at the bases, somewhat streaky in the right perihilar lung. Artifact from EKG leads. Artifact from defibrillator pad on the left where the lung is not visibly aerated. There is no edema, effusion, or pneumothorax. IMPRESSION: 1. Limited low volume chest with obscured or opacified left base. 2. Cardiomegaly with vascular congestion. Electronically Signed   By: Dorn Roulette M.D.   On: 04/27/2023 03:57    Microbiology Recent Results (from the past 240 hours)  Resp panel by RT-PCR (RSV, Flu A&B, Covid) Anterior Nasal Swab     Status: Abnormal   Collection Time: 04/27/23  3:26 AM   Specimen: Anterior Nasal Swab  Result Value Ref Range Status   SARS Coronavirus 2 by RT PCR NEGATIVE NEGATIVE Final   Influenza A by PCR POSITIVE (A) NEGATIVE Final   Influenza B by PCR NEGATIVE NEGATIVE Final    Comment: (NOTE) The Xpert Xpress SARS-CoV-2/FLU/RSV plus assay is intended as an aid in the diagnosis of influenza from Nasopharyngeal swab specimens and should not be used as a sole basis for treatment. Nasal washings and aspirates are unacceptable for Xpert Xpress SARS-CoV-2/FLU/RSV testing.  Fact Sheet for Patients: bloggercourse.com  Fact Sheet for Healthcare Providers: seriousbroker.it  This test is not yet approved or cleared by the [place]  FDA and has been authorized for detection  and/or diagnosis of SARS-CoV-2 by FDA under an Emergency Use Authorization (EUA). This EUA will remain in effect (meaning this test can be used) for the  duration of the COVID-19 declaration under Section 564(b)(1) of the Act, 21 U.S.C. section 360bbb-3(b)(1), unless the authorization is terminated or revoked.     Resp Syncytial Virus by PCR NEGATIVE NEGATIVE Final    Comment: (NOTE) Fact Sheet for Patients: bloggercourse.com  Fact Sheet for Healthcare Providers: seriousbroker.it  This test is not yet approved or cleared by the [place]  FDA and has been authorized for detection and/or diagnosis of SARS-CoV-2 by FDA under an Emergency Use Authorization (EUA). This EUA will remain in effect (meaning this test can be used) for the duration of the COVID-19 declaration under Section 564(b)(1) of the Act, 21 U.S.C. section 360bbb-3(b)(1), unless the authorization is terminated or revoked.  Performed at [facility], [address] [place], [place] [address]   Culture, blood (Routine X 2) w Reflex to ID Panel     Status: None (Preliminary result)   Collection Time: 04/27/23  8:00 AM   Specimen: BLOOD RIGHT FOREARM  Result Value Ref Range Status   Specimen Description BLOOD RIGHT FOREARM  Final   Special Requests   Final    BOTTLES DRAWN AEROBIC AND ANAEROBIC Blood Culture results may not be optimal due to an inadequate volume of blood received in culture bottles   Culture   Final    NO GROWTH 2 DAYS Performed at [facility], [address] [place], [place] [address]    Report Status PENDING  Incomplete  Culture, blood (Routine X 2) w Reflex to ID Panel     Status: None (Preliminary result)   Collection Time: 04/27/23  1:10 PM   Specimen: BLOOD  Result Value Ref Range Status   Specimen Description BLOOD RIGHT ANTECUBITAL  Final   Special Requests   Final    BOTTLES DRAWN AEROBIC AND ANAEROBIC Blood Culture adequate volume   Culture   Final    NO GROWTH 2 DAYS Performed at [facility], [address], [place], [place] [address]    Report Status PENDING   Incomplete  MRSA Next Gen by PCR, Nasal     Status: Abnormal   Collection Time: 04/27/23  3:16 PM   Specimen: Nasal Mucosa; Nasal Swab  Result Value Ref Range Status   MRSA by PCR Next Gen DETECTED (A) NOT DETECTED Final    Comment: RESULT CALLED TO, READ BACK BY AND VERIFIED WITH: [id number] AT 1736 RN CONNELL ORN., ADC (NOTE) The GeneXpert MRSA Assay (FDA approved for NASAL specimens only), is one component of a comprehensive MRSA colonization surveillance program. It is not intended to diagnose MRSA infection nor to guide or monitor treatment for MRSA infections. Test performance is not FDA approved in patients less than 29 years old. Performed at [facility], [address], [place], [place] [address]     Lab Basic Metabolic Panel: Recent Labs  Lab 04/27/23 [phone] 04/27/23 0407 04/27/23 1026 04/27/23 1209 04/28/23 0433 05-07-23 0251 May 07, 2023 0252  NA 137  137 136 135  --  136  --  135  K 3.5  3.5 3.5 3.5  --  4.1  --  3.8  CL 107 105  --   --  106  --  104  CO2  --  13*  --   --  14*  --  12*  GLUCOSE 185* 189*  --   --  99  --  127*  BUN 89* 95*  --   --  98*  --  111*  CREATININE 9.80* 9.57*  --   --  10.28*  --  10.97*  CALCIUM   --  8.0*  --   --  7.8*  --  7.5*  MG  --   --   --  2.0 1.9 1.9  --   PHOS  --   --   --   --  9.7* 10.2* 10.4*   Liver Function Tests: Recent Labs  Lab 04/27/23 0407 15-May-2023 0252  AST 16  --   ALT 11  --   ALKPHOS 67  --   BILITOT 0.6  --   PROT 6.0*  --   ALBUMIN 1.9* <1.5*   No results for input(s): LIPASE, AMYLASE in the last 168 hours. No results for input(s): AMMONIA in the last 168 hours. CBC: Recent Labs  Lab 04/27/23 0342 04/27/23 0407 04/27/23 1026 04/28/23 0433 2023/05/15 0251  WBC  --  19.0*  --  22.5* 16.0*  NEUTROABS  --  17.3*  --   --   --   HGB 9.2*  9.2* 8.7* 10.9* 7.7* 6.5*  HCT 27.0*  27.0* 27.6* 32.0* 24.7* 21.3*  MCV  --  87.6  --  89.8 91.0  PLT  --  422*  --  331 334   Cardiac  Enzymes: No results for input(s): CKTOTAL, CKMB, CKMBINDEX, TROPONINI in the last 168 hours. Sepsis Labs: Recent Labs  Lab 04/27/23 0326 04/27/23 0342 04/27/23 0407 04/27/23 0619 04/27/23 1310 04/28/23 0433 May 15, 2023 0251  PROCALCITON  --   --   --   --  2.99  --   --   WBC  --   --  19.0*  --   --  22.5* 16.0*  LATICACIDVEN 1.0 1.6  --  0.6  --   --   --     Procedures/Operations  Intubated 04/27/2023   Shama Monfils A Taden Witter 04/30/2023, 7:49 AM

## 2023-05-24 NOTE — Progress Notes (Signed)
   05-25-2023 1416  Spiritual Encounters  Type of Visit Initial  Care provided to: [facility] partners present during encounter Nurse  Reason for visit End-of-life  OnCall Visit No   Responded to EOL consult. Met with family of 6. Arrangements being made to move patient to comfort care.

## 2023-05-24 DEATH — deceased
